# Patient Record
Sex: Female | Born: 1950
Health system: Southern US, Community
[De-identification: ages and names within clinical notes are randomized; demographics above are authoritative.]

## PROBLEM LIST (undated history)

## (undated) DIAGNOSIS — K635 Polyp of colon: Secondary | ICD-10-CM

## (undated) DIAGNOSIS — Z9889 Other specified postprocedural states: Secondary | ICD-10-CM

## (undated) DIAGNOSIS — I10 Essential (primary) hypertension: Secondary | ICD-10-CM

## (undated) DIAGNOSIS — F419 Anxiety disorder, unspecified: Secondary | ICD-10-CM

## (undated) DIAGNOSIS — R011 Cardiac murmur, unspecified: Secondary | ICD-10-CM

## (undated) DIAGNOSIS — T7840XA Allergy, unspecified, initial encounter: Secondary | ICD-10-CM

## (undated) DIAGNOSIS — Z974 Presence of external hearing-aid: Secondary | ICD-10-CM

## (undated) DIAGNOSIS — J45909 Unspecified asthma, uncomplicated: Secondary | ICD-10-CM

## (undated) DIAGNOSIS — H509 Unspecified strabismus: Secondary | ICD-10-CM

## (undated) DIAGNOSIS — H269 Unspecified cataract: Secondary | ICD-10-CM

## (undated) DIAGNOSIS — D649 Anemia, unspecified: Secondary | ICD-10-CM

## (undated) DIAGNOSIS — T753XXA Motion sickness, initial encounter: Secondary | ICD-10-CM

## (undated) DIAGNOSIS — R519 Headache, unspecified: Secondary | ICD-10-CM

## (undated) DIAGNOSIS — F32A Depression, unspecified: Secondary | ICD-10-CM

## (undated) DIAGNOSIS — C801 Malignant (primary) neoplasm, unspecified: Secondary | ICD-10-CM

## (undated) DIAGNOSIS — M1990 Unspecified osteoarthritis, unspecified site: Secondary | ICD-10-CM

## (undated) DIAGNOSIS — IMO0002 Reserved for concepts with insufficient information to code with codable children: Secondary | ICD-10-CM

## (undated) DIAGNOSIS — R112 Nausea with vomiting, unspecified: Secondary | ICD-10-CM

## (undated) HISTORY — DX: Malignant (primary) neoplasm, unspecified: C80.1

## (undated) HISTORY — DX: Unspecified strabismus: H50.9

## (undated) HISTORY — DX: Anxiety disorder, unspecified: F41.9

## (undated) HISTORY — DX: Polyp of colon: K63.5

## (undated) HISTORY — PX: GALLBLADDER SURGERY: SHX652

## (undated) HISTORY — PX: HEMORRHOID SURGERY: SHX153

## (undated) HISTORY — DX: Anemia, unspecified: D64.9

## (undated) HISTORY — PX: CHOLECYSTECTOMY: SHX55

## (undated) HISTORY — DX: Unspecified asthma, uncomplicated: J45.909

## (undated) HISTORY — DX: Unspecified osteoarthritis, unspecified site: M19.90

## (undated) HISTORY — DX: Unspecified cataract: H26.9

## (undated) HISTORY — DX: Reserved for concepts with insufficient information to code with codable children: IMO0002

## (undated) HISTORY — DX: Allergy, unspecified, initial encounter: T78.40XA

## (undated) HISTORY — DX: Depression, unspecified: F32.A

---
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
## 1998-05-18 HISTORY — PX: BILATERAL OOPHORECTOMY: SHX1221

## 1998-05-18 HISTORY — PX: ABDOMINAL HYSTERECTOMY: SHX81

## 1999-05-19 DIAGNOSIS — K635 Polyp of colon: Secondary | ICD-10-CM

## 1999-05-19 HISTORY — DX: Polyp of colon: K63.5

## 2011-03-26 ENCOUNTER — Encounter: Payer: Self-pay | Admitting: Internal Medicine

## 2011-03-27 ENCOUNTER — Ambulatory Visit: Payer: Self-pay | Admitting: Internal Medicine

## 2011-03-27 ENCOUNTER — Encounter: Payer: Self-pay | Admitting: Internal Medicine

## 2011-03-27 ENCOUNTER — Ambulatory Visit (INDEPENDENT_AMBULATORY_CARE_PROVIDER_SITE_OTHER): Payer: BC Managed Care – PPO | Admitting: Internal Medicine

## 2011-03-27 VITALS — BP 128/80 | HR 79 | Temp 99.7°F | Wt 158.0 lb

## 2011-03-27 DIAGNOSIS — J329 Chronic sinusitis, unspecified: Secondary | ICD-10-CM

## 2011-03-27 DIAGNOSIS — I10 Essential (primary) hypertension: Secondary | ICD-10-CM

## 2011-03-27 MED ORDER — AZITHROMYCIN 250 MG PO TABS: ORAL_TABLET | ORAL | Status: AC

## 2011-03-27 NOTE — Patient Instructions (Signed)

## 2011-03-27 NOTE — Progress Notes (Signed)
Subjective:    Patient ID: Shelley Zuniga, female    DOB: 12-Oct-1950, 60 y.o.   MRN: [id number]  Sinusitis This is a recurrent problem. The current episode started 1 to 4 weeks ago. The problem has been gradually worsening since onset. The pain is moderate. Associated symptoms include congestion, ear pain, headaches, sinus pressure and a sore throat. Pertinent negatives include no chills, coughing, neck pain or shortness of breath. Past treatments include acetaminophen, lying down, saline sprays and spray decongestants. The treatment provided no relief.     Outpatient Encounter Prescriptions as of 03/27/2011  Medication Sig Dispense Refill  . aspirin 81 MG tablet Take 81 mg by mouth daily.        Marland Kitchen buPROPion (WELLBUTRIN XL) 300 MG 24 hr tablet Take 300 mg by mouth daily.        . cholecalciferol (VITAMIN D) 1000 UNITS tablet Take 1,000 Units by mouth daily.        Tery Sanfilippo Calcium (STOOL SOFTENER PO) Take by mouth.        . estradiol (ESTRACE) 1 MG tablet Take 1 mg by mouth daily.        . fluticasone (FLONASE) 50 MCG/ACT nasal spray Place 2 sprays into the nose daily.        Marland Kitchen losartan (COZAAR) 50 MG tablet Take 50 mg by mouth daily.        . niacin 500 MG tablet Take 500 mg by mouth daily with breakfast.        . vitamin E 400 UNIT capsule Take 400 Units by mouth daily.        Marland Kitchen azithromycin (ZITHROMAX Z-PAK) 250 MG tablet Take 2 tablets (500 mg) on  Day 1,  followed by 1 tablet (250 mg) once daily on Days 2 through 5.  6 each  0    Review of Systems  Constitutional: Negative for fever, chills, appetite change, fatigue and unexpected weight change.  HENT: Positive for ear pain, congestion, sore throat, postnasal drip and sinus pressure. Negative for trouble swallowing, neck pain and voice change.   Eyes: Negative for visual disturbance.  Respiratory: Negative for cough, shortness of breath and wheezing.   Cardiovascular: Negative for chest pain, palpitations and leg swelling.    Gastrointestinal: Negative for nausea, vomiting, abdominal pain, diarrhea, constipation, blood in stool, abdominal distention and anal bleeding.  Musculoskeletal: Negative for myalgias, arthralgias and gait problem.  Skin: Negative for color change and rash.  Neurological: Positive for headaches. Negative for dizziness.  Hematological: Negative for adenopathy. Does not bruise/bleed easily.   BP 128/80  Pulse 79  Temp(Src) 99.7 F (37.6 C) (Oral)  Wt 158 lb (71.668 kg)  SpO2 96%     Objective:   Physical Exam  Constitutional: She is oriented to person, place, and time. She appears well-developed and well-nourished. No distress.  HENT:  Head: Normocephalic and atraumatic.  Right Ear: External ear normal. A middle ear effusion is present.  Left Ear: External ear normal. A middle ear effusion is present.  Nose: Mucosal edema present.  Mouth/Throat: Posterior oropharyngeal erythema present. No oropharyngeal exudate.  Eyes: Conjunctivae are normal. Pupils are equal, round, and reactive to light. Right eye exhibits no discharge. Left eye exhibits no discharge. No scleral icterus.  Neck: Normal range of motion. Neck supple. No tracheal deviation present. No thyromegaly present.  Cardiovascular: Normal rate, regular rhythm, normal heart sounds and intact distal pulses.  Exam reveals no gallop and no friction rub.   No murmur  heard. Pulmonary/Chest: Effort normal and breath sounds normal. No respiratory distress. She has no wheezes. She has no rales. She exhibits no tenderness.  Musculoskeletal: Normal range of motion. She exhibits no edema and no tenderness.  Lymphadenopathy:    She has no cervical adenopathy.  Neurological: She is alert and oriented to person, place, and time. No cranial nerve deficit. She exhibits normal muscle tone. Coordination normal.  Skin: Skin is warm and dry. No rash noted. She is not diaphoretic. No erythema. No pallor.  Psychiatric: She has a normal mood and  affect. Her behavior is normal. Judgment and thought content normal.          Assessment & Plan:  1. Sinusitis - Sx consistent with pt h/o recurrent sinusitis. Will treat with azithromycin as pt has tolerated well in past. Will continue to use mucinex and ibuprofen prn. Follow up if symptoms not improving in next 48-72hr.  2. Hypertension - Due for follow up labs including CMP, urine microalbumin. Will set up labs prior to follow up with Dr. Darrick Huntsman.

## 2011-04-06 ENCOUNTER — Other Ambulatory Visit (INDEPENDENT_AMBULATORY_CARE_PROVIDER_SITE_OTHER): Payer: BC Managed Care – PPO | Admitting: *Deleted

## 2011-04-06 DIAGNOSIS — I10 Essential (primary) hypertension: Secondary | ICD-10-CM

## 2011-04-06 LAB — LIPID PANEL
HDL: 54.6 mg/dL (ref >39.00)
VLDL: 44.8 mg/dL — ABNORMAL HIGH (ref 0.0–40.0)

## 2011-04-06 LAB — COMPREHENSIVE METABOLIC PANEL
ALT: 14 U/L (ref 0–35)
AST: 14 U/L (ref 0–37)
Creatinine, Ser: 0.8 mg/dL (ref 0.4–1.2)
Total Bilirubin: 0.6 mg/dL (ref 0.3–1.2)

## 2011-04-06 LAB — MICROALBUMIN / CREATININE URINE RATIO
Creatinine,U: 135.7 mg/dL
Microalb, Ur: 0.6 mg/dL (ref 0.0–1.9)

## 2011-04-13 ENCOUNTER — Encounter: Payer: Self-pay | Admitting: Internal Medicine

## 2011-04-13 ENCOUNTER — Ambulatory Visit (INDEPENDENT_AMBULATORY_CARE_PROVIDER_SITE_OTHER): Payer: BC Managed Care – PPO | Admitting: Internal Medicine

## 2011-04-13 DIAGNOSIS — Z6825 Body mass index (BMI) 25.0-25.9, adult: Secondary | ICD-10-CM

## 2011-04-13 DIAGNOSIS — E782 Mixed hyperlipidemia: Secondary | ICD-10-CM

## 2011-04-13 DIAGNOSIS — Z124 Encounter for screening for malignant neoplasm of cervix: Secondary | ICD-10-CM | POA: Insufficient documentation

## 2011-04-13 DIAGNOSIS — E663 Overweight: Secondary | ICD-10-CM

## 2011-04-13 DIAGNOSIS — Z1239 Encounter for other screening for malignant neoplasm of breast: Secondary | ICD-10-CM

## 2011-04-13 DIAGNOSIS — Z13820 Encounter for screening for osteoporosis: Secondary | ICD-10-CM

## 2011-04-13 DIAGNOSIS — Z1211 Encounter for screening for malignant neoplasm of colon: Secondary | ICD-10-CM

## 2011-04-13 MED ORDER — PREDNISONE (PAK) 10 MG PO TABS: ORAL_TABLET | ORAL | Status: AC

## 2011-04-13 MED ORDER — AMOXICILLIN-POT CLAVULANATE 875-125 MG PO TABS: 1.0000 | ORAL_TABLET | Freq: Two times a day (BID) | ORAL | Status: AC

## 2011-04-13 NOTE — Patient Instructions (Addendum)
Avoid steroid nasal spray for one to two weeks  Resume saline spray twice a day..  Add Sudafed PE  10 mg every 6 hours for 3-4 days  mucines (or generic as guaifenesin ) 600 mg twice daily to thin out secretions.  Lots of water.  Prescribed meds: Prednisone taper over 6 days.  augmentin 1 tablet twice daily with food for 2 weeks.   Samples given:  Align (probiotic) 1 tablet daily to prevent diarrhea from the antibiotic killing off good bacteria  If you have a mild cough,  Try Delsym cough suppressant   To help lower your triglycerides and raise your HDL:  Exercise gaol is 25 minutes 5 days per week.  (if walking you should be short of breath when you finish)  Read about the low glycemic index diets and the Mediterranean Diet  [employer]  Is a [place] based company than males a pita bread and a flatbread that are 4 net carbs/serving ([employer] and [employer])

## 2011-04-13 NOTE — Progress Notes (Signed)
Subjective:    Patient ID: Shelley Zuniga, female    DOB: 12/07/1950, 60 y.o.   MRN: [id number]  HPI  60 yo yr old nonsmoker treated for sinusitis on Nov 8 by Dr. Dan Humphreys with azithromycin x 5 days with incomplete resolution of symptoms.  Continues to report sinus and ear pain with purulent and blood streaked nasal discharge.  No fevers, odynophagia or dizziness.  Second issue is repeat annual fasting lipids were notable for triglyceride elevation, LDL elevation (mild) and drop in HDL.     Past Medical History  Diagnosis Date  . Strabismus age 42    eye surgery  . Colon polyp 2001    colonoscopy, [facility]        Current Outpatient Prescriptions on File Prior to Visit  Medication Sig Dispense Refill  . aspirin 81 MG tablet Take 81 mg by mouth daily.        Marland Kitchen buPROPion (WELLBUTRIN XL) 300 MG 24 hr tablet Take 300 mg by mouth daily.        . cholecalciferol (VITAMIN D) 1000 UNITS tablet Take 1,000 Units by mouth daily.        Tery Sanfilippo Calcium (STOOL SOFTENER PO) Take by mouth.        . estradiol (ESTRACE) 1 MG tablet Take 1 mg by mouth daily.        . fluticasone (FLONASE) 50 MCG/ACT nasal spray Place 2 sprays into the nose daily.        Marland Kitchen losartan (COZAAR) 50 MG tablet Take 50 mg by mouth daily.        . niacin 500 MG tablet Take 500 mg by mouth daily with breakfast.        . vitamin E 400 UNIT capsule Take 400 Units by mouth daily.           Review of Systems  Constitutional: Negative for fever, chills, appetite change, fatigue and unexpected weight change.  HENT: Positive for ear pain, congestion, sore throat, postnasal drip and sinus pressure. Negative for trouble swallowing, neck pain and voice change.   Eyes: Negative for visual disturbance.  Respiratory: Negative for cough, shortness of breath and wheezing.   Cardiovascular: Negative for chest pain, palpitations and leg swelling.  Gastrointestinal: Negative for nausea, vomiting, abdominal pain, diarrhea, constipation, blood in  stool, abdominal distention and anal bleeding.  Musculoskeletal: Negative for myalgias, arthralgias and gait problem.  Skin: Negative for color change and rash.  Neurological: Positive for headaches. Negative for dizziness.  Hematological: Negative for adenopathy. Does not bruise/bleed easily.       Objective:   Physical Exam  Constitutional: She is oriented to person, place, and time. She appears well-developed and well-nourished. No distress.  HENT:  Head: Normocephalic and atraumatic.  Right Ear: External ear normal. Tympanic membrane is erythematous and bulging. A middle ear effusion is present.  Left Ear: External ear normal. Tympanic membrane is erythematous and bulging. A middle ear effusion is present.  Nose: Mucosal edema present.  Mouth/Throat: No oropharyngeal exudate or posterior oropharyngeal erythema.  Eyes: Conjunctivae are normal. Pupils are equal, round, and reactive to light. Right eye exhibits no discharge. Left eye exhibits no discharge. No scleral icterus.  Neck: Normal range of motion. Neck supple. No tracheal deviation present. No thyromegaly present.  Cardiovascular: Normal rate, regular rhythm, normal heart sounds and intact distal pulses.  Exam reveals no gallop and no friction rub.   No murmur heard. Pulmonary/Chest: Effort normal and breath sounds normal. No respiratory distress.  She has no wheezes. She has no rales. She exhibits no tenderness.  Musculoskeletal: Normal range of motion. She exhibits no edema and no tenderness.  Lymphadenopathy:    She has no cervical adenopathy.  Neurological: She is alert and oriented to person, place, and time. No cranial nerve deficit. She exhibits normal muscle tone. Coordination normal.  Skin: Skin is warm and dry. No rash noted. She is not diaphoretic. No erythema. No pallor.  Psychiatric: She has a normal mood and affect. Her behavior is normal. Judgment and thought content normal.          Assessment & Plan:    Chronic sinutisis: will treat for 2 weeks with augmentin , steroid taper and decongestant.  Align saples given to mitigate risk of AAD /c dif colitis.

## 2011-04-14 ENCOUNTER — Encounter: Payer: Self-pay | Admitting: Internal Medicine

## 2011-04-14 DIAGNOSIS — E782 Mixed hyperlipidemia: Secondary | ICD-10-CM | POA: Insufficient documentation

## 2011-04-14 DIAGNOSIS — Z13820 Encounter for screening for osteoporosis: Secondary | ICD-10-CM | POA: Insufficient documentation

## 2011-04-14 NOTE — Assessment & Plan Note (Addendum)
With rise in triglycerides, drop in HDL in elevation mild of LDL.  Spent 10 minutes discussing diet and exercise for goals of lipid improvement and weight management.Contineu niacin for now.  Repeat in  6 months

## 2011-05-19 HISTORY — PX: BASAL CELL CARCINOMA EXCISION: SHX1214

## 2011-05-29 ENCOUNTER — Telehealth: Payer: Self-pay | Admitting: *Deleted

## 2011-05-29 MED ORDER — DOXYCYCLINE HYCLATE 100 MG PO TABS: 100.0000 mg | ORAL_TABLET | Freq: Two times a day (BID) | ORAL | Status: AC

## 2011-05-29 NOTE — Telephone Encounter (Signed)
You can call her in doxycycline 100 mg one tablet twice daily with food.  #14 no refills this is an antibiotic.

## 2011-05-29 NOTE — Telephone Encounter (Signed)
Patient notified- Rx called to pharmacy. 

## 2011-05-29 NOTE — Telephone Encounter (Signed)
Patient complains of cough, headaches, some congestion, post nasal drip. NO fever. She has been taking sudafed like you had recommended to her in the past, but isn't noticing a big difference. She is currently taking care of her grandchildren because her daughter in law just had heart valve repair and she is concerned that she is going to get them sick or even her daughter in law. She is asking if she should start antibiotic or if she should try something different over the counter.

## 2011-06-23 ENCOUNTER — Encounter: Payer: Self-pay | Admitting: Internal Medicine

## 2011-07-03 ENCOUNTER — Ambulatory Visit (INDEPENDENT_AMBULATORY_CARE_PROVIDER_SITE_OTHER): Payer: BC Managed Care – PPO | Admitting: Internal Medicine

## 2011-07-03 ENCOUNTER — Encounter: Payer: Self-pay | Admitting: Internal Medicine

## 2011-07-03 VITALS — BP 118/82 | HR 72 | Temp 98.5°F | Wt 158.0 lb

## 2011-07-03 DIAGNOSIS — J029 Acute pharyngitis, unspecified: Secondary | ICD-10-CM

## 2011-07-03 LAB — POCT RAPID STREP A (OFFICE): Rapid Strep A Screen: NEGATIVE

## 2011-07-03 MED ORDER — AMOXICILLIN-POT CLAVULANATE 875-125 MG PO TABS: 1.0000 | ORAL_TABLET | Freq: Two times a day (BID) | ORAL | Status: AC

## 2011-07-03 NOTE — Progress Notes (Signed)
Subjective:    Patient ID: Shelley Zuniga, female    DOB: Oct 24, 1950, 61 y.o.   MRN: [id number]  HPI  61 yr old presents with sudden onset of sore throat , woke her up this am at 1:00am.  Positive sick contact Wednesday , family members of friend she had dinner with had strep throat.  No fevers, myalgias or headache,  No sinus drainage,  She does snore .    Past Medical History  Diagnosis Date  . Strabismus age 59    eye surgery  . Colon polyp 2001    colonoscopy, [facility]    Current Outpatient Prescriptions on File Prior to Visit  Medication Sig Dispense Refill  . aspirin 81 MG tablet Take 81 mg by mouth daily.        . cholecalciferol (VITAMIN D) 1000 UNITS tablet Take 1,000 Units by mouth daily.        Tery Sanfilippo Calcium (STOOL SOFTENER PO) Take by mouth.        . estradiol (ESTRACE) 1 MG tablet Take 1 mg by mouth daily.        . fluticasone (FLONASE) 50 MCG/ACT nasal spray Place 2 sprays into the nose daily.        Marland Kitchen losartan (COZAAR) 50 MG tablet Take 50 mg by mouth daily.        . vitamin E 400 UNIT capsule Take 400 Units by mouth daily.        Marland Kitchen buPROPion (WELLBUTRIN XL) 300 MG 24 hr tablet Take 300 mg by mouth daily.        . niacin 500 MG tablet Take 500 mg by mouth daily with breakfast.          Review of Systems  Constitutional: Negative for fever, chills and unexpected weight change.  HENT: Positive for sore throat. Negative for hearing loss, ear pain, nosebleeds, congestion, facial swelling, rhinorrhea, sneezing, mouth sores, trouble swallowing, neck pain, neck stiffness, voice change, postnasal drip, sinus pressure, tinnitus and ear discharge.   Eyes: Negative for pain, discharge, redness and visual disturbance.  Respiratory: Negative for cough, chest tightness, shortness of breath, wheezing and stridor.   Cardiovascular: Negative for chest pain, palpitations and leg swelling.  Musculoskeletal: Negative for myalgias and arthralgias.  Skin: Negative for color change and rash.   Neurological: Negative for dizziness, weakness, light-headedness and headaches.  Hematological: Negative for adenopathy.      Objective:   Physical Exam  Constitutional: She is oriented to person, place, and time. She appears well-developed and well-nourished.  HENT:  Mouth/Throat: Posterior oropharyngeal erythema present.  Eyes: EOM are normal. Pupils are equal, round, and reactive to light. No scleral icterus.  Neck: Normal range of motion. Neck supple. No JVD present. No thyromegaly present.  Cardiovascular: Normal rate, regular rhythm, normal heart sounds and intact distal pulses.   Pulmonary/Chest: Effort normal and breath sounds normal.  Abdominal: Soft. Bowel sounds are normal. She exhibits no mass. There is no tenderness.  Musculoskeletal: Normal range of motion. She exhibits no edema.  Lymphadenopathy:    She has cervical adenopathy.  Neurological: She is alert and oriented to person, place, and time.  Skin: Skin is warm and dry.  Psychiatric: She has a normal mood and affect.      Assessment & Plan:  Pharyngitis:  Rapid strep test was negative.   Recommeded patient treat for viral URI and delay use of augmentin  for 48 hours since she is not febrile or if symptoms dfo not  resolve.he

## 2011-07-05 ENCOUNTER — Encounter: Payer: Self-pay | Admitting: Internal Medicine

## 2011-08-04 ENCOUNTER — Other Ambulatory Visit: Payer: Self-pay | Admitting: *Deleted

## 2011-08-04 MED ORDER — LOSARTAN POTASSIUM 50 MG PO TABS: 50.0000 mg | ORAL_TABLET | Freq: Every day | ORAL | Status: DC

## 2011-09-29 ENCOUNTER — Encounter: Payer: Self-pay | Admitting: Internal Medicine

## 2011-09-29 ENCOUNTER — Ambulatory Visit (INDEPENDENT_AMBULATORY_CARE_PROVIDER_SITE_OTHER): Payer: BC Managed Care – PPO | Admitting: Internal Medicine

## 2011-09-29 VITALS — BP 116/64 | HR 66 | Temp 98.3°F | Resp 14 | Ht 64.0 in | Wt 152.8 lb

## 2011-09-29 DIAGNOSIS — E782 Mixed hyperlipidemia: Secondary | ICD-10-CM

## 2011-09-29 DIAGNOSIS — I10 Essential (primary) hypertension: Secondary | ICD-10-CM

## 2011-09-29 DIAGNOSIS — E785 Hyperlipidemia, unspecified: Secondary | ICD-10-CM

## 2011-09-29 NOTE — Progress Notes (Signed)
Patient ID: Shelley Zuniga, female   DOB: 10-12-1950, 61 y.o.   MRN: [id number]  Patient Active Problem List  Diagnoses  . Hypertension  . Screening for cervical cancer  . Screening for breast cancer  . Screening for colon cancer  . Screening for osteoporosis  . Mixed hyperlipidemia    Subjective:  CC:   Chief Complaint  Patient presents with  . Follow-up    HPI:   Shelley Zuniga a 61 y.o. female who presents for  6 month follow up on hypertension,  Obesity and hyperlipidemia.  She has las lost 10 lbs by her scales by following a low glycemic index diet and exercising in the yard. She feels good, has no new issues,  Except occasional occurrence of right deltoid/arm pain . The pain is mild,  Aggravated with use of arm including forwarf flexion and internal rotation.  No history of trauma.  No weakness or numbness.     Past Medical History  Diagnosis Date  . Strabismus age 48    eye surgery  . Colon polyp 2001    colonoscopy, [facility]     Past Surgical History  Procedure Date  . Hemorrhoid surgery   . Gallbladder surgery   . Eye surgery 1956    to correct strabismus  . Bilateral oophorectomy 2000    fibroid cysts  . Abdominal hysterectomy 2000    [place], [facility], fibroids/menorrhagia         The following portions of the patient's history were reviewed and updated as appropriate: Allergies, current medications, and problem list.    Review of Systems:   12 Pt  review of systems was negative except those addressed in the HPI,     History   Social History  . Marital Status: Married    Spouse Name: N/A    Number of Children: N/A  . Years of Education: N/A   Occupational History  . Not on file.   Social History Main Topics  . Smoking status: Never Smoker   . Smokeless tobacco: Never Used  . Alcohol Use: No  . Drug Use: No  . Sexually Active: Yes    Birth Control/ Protection: Post-menopausal   Other Topics Concern  . Not on file   Social History  Narrative  . No narrative on file    Objective:  BP 116/64  Pulse 66  Temp(Src) 98.3 F (36.8 C) (Oral)  Resp 14  Ht 5\' 4"  (1.626 m)  Wt 152 lb 12 oz (69.287 kg)  BMI 26.22 kg/m2  SpO2 95%  General appearance: alert, cooperative and appears stated age Ears: normal TM's and external ear canals both ears Throat: lips, mucosa, and tongue normal; teeth and gums normal Neck: no adenopathy, no carotid bruit, supple, symmetrical, trachea midline and thyroid not enlarged, symmetric, no tenderness/mass/nodules Back: symmetric, no curvature. ROM normal. No CVA tenderness. Lungs: clear to auscultation bilaterally Heart: regular rate and rhythm, S1, S2 normal, no murmur, click, rub or gallop Abdomen: soft, non-tender; bowel sounds normal; no masses,  no organomegaly Pulses: 2+ and symmetric Skin: Skin color, texture, turgor normal. No rashes or lesions Lymph nodes: Cervical, supraclavicular, and axillary nodes normal.  Assessment and Plan:  Hypertension Well controlled on current medications.  No changes today. Renal function to check today  Mixed hyperlipidemia Mild with trigs and LDL slightly up,  Addressed with weight loss. Repeat due. Taking as aspirin daily    Updated Medication List Outpatient Encounter Prescriptions as of 09/29/2011  Medication Sig  Dispense Refill  . aspirin 81 MG tablet Take 81 mg by mouth daily.        . cholecalciferol (VITAMIN D) 1000 UNITS tablet Take 1,000 Units by mouth daily.        Tery Sanfilippo Calcium (STOOL SOFTENER PO) Take by mouth.        . estradiol (ESTRACE) 1 MG tablet Take 1 mg by mouth daily.        . fluticasone (FLONASE) 50 MCG/ACT nasal spray Place 2 sprays into the nose daily.        Marland Kitchen losartan (COZAAR) 50 MG tablet Take 1 tablet (50 mg total) by mouth daily.  90 tablet  3  . vitamin E 400 UNIT capsule Take 400 Units by mouth daily.        Marland Kitchen DISCONTD: buPROPion (WELLBUTRIN XL) 300 MG 24 hr tablet Take 300 mg by mouth daily.        Marland Kitchen  DISCONTD: niacin 500 MG tablet Take 500 mg by mouth daily with breakfast.           Orders Placed This Encounter  Procedures  . HM PAP SMEAR  . Lipid panel  . COMPLETE METABOLIC PANEL WITH GFR    No Follow-up on file.

## 2011-09-29 NOTE — Patient Instructions (Signed)
Consider the Low Glycemic Index Diet and 6 smaller meals daily .  This boosts your metabolism and regulates your sugars:   7 AM Low carbohydrate Protein  Shakes (EAS Carb Control  Or Atkins ,  Available everywhere,   In  cases at [employer] )  2.5 carbs  (Add or substitute a toasted sandwhich thin w/ peanut butter)  10 AM: Protein bar by Atkins (snack size,  Chocolate lover's variety at  [employer])    Lunch: sandwich on pita bread or flatbread (Joseph's makes a pita bread and a flat bread , available at [employer] and [employer]; Toufayah makes a low carb flatbread available at [employer] and [employer]) Mission makes a low carb whole wheat tortilla available at [employer] stores (Carb balance)  3 PM:  Mid day :  Another protein bar,  Or a  cheese stick, 1/4 cup of almonds, walnuts, pistachios, pecans, peanuts,  Macadamia nuts  6 PM  Dinner:  "mean and green:"  Meat/chicken/fish, salad, and green veggie : use ranch, vinagrette,  Blue cheese, etc  9 PM snack : Breyer's low carb fudgsicle or  ice cream bar (Carb Smart), or  Weight Watcher's ice cream bar , or another protein shake

## 2011-09-29 NOTE — Assessment & Plan Note (Signed)
Well controlled on current medications.  No changes today. Renal function to check today

## 2011-09-29 NOTE — Assessment & Plan Note (Addendum)
Mild with trigs and LDL slightly up,  Addressed with weight loss. Repeat due. Taking as aspirin daily

## 2011-10-14 ENCOUNTER — Other Ambulatory Visit (INDEPENDENT_AMBULATORY_CARE_PROVIDER_SITE_OTHER): Payer: BC Managed Care – PPO | Admitting: *Deleted

## 2011-10-14 DIAGNOSIS — E785 Hyperlipidemia, unspecified: Secondary | ICD-10-CM

## 2011-10-14 LAB — LIPID PANEL
HDL: 51.7 mg/dL (ref >39.00)
LDL Cholesterol: 102 mg/dL — ABNORMAL HIGH (ref 0–99)
Total CHOL/HDL Ratio: 4
VLDL: 29.8 mg/dL (ref 0.0–40.0)

## 2011-10-14 LAB — COMPLETE METABOLIC PANEL WITH GFR
ALT: 8 U/L (ref 0–35)
AST: 10 U/L (ref 0–37)
Alkaline Phosphatase: 51 U/L (ref 39–117)
Creat: 0.72 mg/dL (ref 0.50–1.10)
Sodium: 140 mEq/L (ref 135–145)
Total Bilirubin: 0.5 mg/dL (ref 0.3–1.2)
Total Protein: 6.2 g/dL (ref 6.0–8.3)

## 2011-12-09 ENCOUNTER — Other Ambulatory Visit (INDEPENDENT_AMBULATORY_CARE_PROVIDER_SITE_OTHER): Payer: BC Managed Care – PPO | Admitting: *Deleted

## 2011-12-09 DIAGNOSIS — N390 Urinary tract infection, site not specified: Secondary | ICD-10-CM

## 2011-12-09 LAB — POCT URINALYSIS DIPSTICK
Bilirubin, UA: NEGATIVE
Glucose, UA: NEGATIVE
Nitrite, UA: POSITIVE
Spec Grav, UA: 1.015

## 2011-12-09 MED ORDER — CIPROFLOXACIN HCL 250 MG PO TABS: 250.0000 mg | ORAL_TABLET | Freq: Two times a day (BID) | ORAL | Status: AC

## 2011-12-09 NOTE — Addendum Note (Signed)
Addended by: Duncan Dull on: 12/09/2011 01:09 PM   Modules accepted: Orders

## 2011-12-13 LAB — URINE CULTURE: Colony Count: >=100000

## 2012-07-15 ENCOUNTER — Telehealth: Payer: Self-pay | Admitting: Internal Medicine

## 2012-07-15 NOTE — Telephone Encounter (Signed)
Disregard. Pt is going to walk-in clinic since we have no openings.

## 2012-07-15 NOTE — Telephone Encounter (Signed)
Called pt. States throat has been hurting since Wednesday. Has a hard time swallowing anything. States she has post nasal drainage, cough- productive, sinus pressure, Afebrile.

## 2012-07-15 NOTE — Telephone Encounter (Signed)
Patient Information:  Caller Name: Diego  Phone: [phone]  Patient: Shelley Zuniga, Shelley Zuniga  Gender: Female  DOB: October 16, 1950  Age: 62 Years  PCP: Duncan Dull (Adults only)  Office Follow Up:  Does the office need to follow up with this patient?: Yes  Instructions For The Office: Please call back regarding work in appointment today in office;  declined appointments at [facility] or [place] office due to distance.  RN Note:  Throat red with white.  Sore throat pain increasing. Drinking fluids and voiding. No appointments remain in  [place] office; does not want to drive to [facility] or [facility] offices.  Message sent to office staff for possible work-in appointment; please call back ASAP.   Symptoms  Reason For Call & Symptoms: Sore throat  Reviewed Health History In EMR: Yes  Reviewed Medications In EMR: Yes  Reviewed Allergies In EMR: Yes  Reviewed Surgeries / Procedures: Yes  Date of Onset of Symptoms: 07/06/2012  Treatments Tried: hot and cold fluids, Advil  Treatments Tried Worked: Yes  Guideline(s) Used:  Sore Throat  Disposition Per Guideline:   See Today in Office  Reason For Disposition Reached:   Severe sore throat pain  Advice Given:  For Relief of Sore Throat Pain:  Sip warm chicken broth or apple juice.  Suck on hard candy or a throat lozenge (over-the-counter).  Gargle warm salt water 3 times daily (1 teaspoon of salt in 8 oz or 240 ml of warm water).  Pain Medicines:  For pain relief, you can take either acetaminophen, ibuprofen, or naproxen.  Soft Diet:   Cold drinks and milk shakes are especially good (Reason: swollen tonsils can make some foods hard to swallow).  Liquids:  Adequate liquid intake is important to prevent dehydration. Drink 6-8 glasses of water per day.  Expected Course:  Sore throats with viral illnesses usually last 3 or 4 days.  Call Back If:  Sore throat is the main symptom and it lasts longer than 24 hours  Sore throat is mild but lasts  longer than 4 days  Fever lasts longer than 3 days  You become worse.

## 2012-07-29 ENCOUNTER — Other Ambulatory Visit: Payer: Self-pay | Admitting: Internal Medicine

## 2012-07-29 NOTE — Telephone Encounter (Signed)
Refilled #30 with 0 refills. Pt needs an appt for additional refills.

## 2012-09-06 ENCOUNTER — Other Ambulatory Visit: Payer: Self-pay | Admitting: Internal Medicine

## 2012-09-07 NOTE — Telephone Encounter (Signed)
Rx sent to pharmacy by escript Appt 09/19/12

## 2012-09-19 ENCOUNTER — Ambulatory Visit (INDEPENDENT_AMBULATORY_CARE_PROVIDER_SITE_OTHER): Payer: BC Managed Care – PPO | Admitting: Internal Medicine

## 2012-09-19 ENCOUNTER — Encounter: Payer: Self-pay | Admitting: Internal Medicine

## 2012-09-19 VITALS — BP 126/72 | HR 63 | Temp 98.0°F | Resp 16 | Ht 64.0 in | Wt 158.2 lb

## 2012-09-19 DIAGNOSIS — Z0000 Encounter for general adult medical examination without abnormal findings: Secondary | ICD-10-CM | POA: Insufficient documentation

## 2012-09-19 DIAGNOSIS — Z1239 Encounter for other screening for malignant neoplasm of breast: Secondary | ICD-10-CM

## 2012-09-19 DIAGNOSIS — Z1211 Encounter for screening for malignant neoplasm of colon: Secondary | ICD-10-CM

## 2012-09-19 DIAGNOSIS — Z124 Encounter for screening for malignant neoplasm of cervix: Secondary | ICD-10-CM

## 2012-09-19 DIAGNOSIS — I10 Essential (primary) hypertension: Secondary | ICD-10-CM

## 2012-09-19 DIAGNOSIS — Z13820 Encounter for screening for osteoporosis: Secondary | ICD-10-CM

## 2012-09-19 MED ORDER — ZOSTER VACCINE LIVE 19400 UNT/0.65ML ~~LOC~~ SOLR: 0.6500 mL | Freq: Once | SUBCUTANEOUS | Status: DC

## 2012-09-19 NOTE — Progress Notes (Signed)
Patient ID: Shelley Zuniga, female   DOB: Mar 01, 1951, 62 y.o.   MRN: [id number]  Subjective:     Shelley Zuniga is a 62 y.o. female and is here for a comprehensive physical exam. The patient reports no problems.  History   Social History  . Marital Status: Married    Spouse Name: N/A    Number of Children: N/A  . Years of Education: N/A   Occupational History  . Not on file.   Social History Main Topics  . Smoking status: Never Smoker   . Smokeless tobacco: Never Used  . Alcohol Use: No  . Drug Use: No  . Sexually Active: Yes    Birth Control/ Protection: Post-menopausal   Other Topics Concern  . Not on file   Social History Narrative  . No narrative on file   Health Maintenance  Topic Date Due  . Tetanus/tdap  01/27/1969  . Zostavax  01/27/2010  . Mammogram  10/17/2011  . Influenza Vaccine  01/16/2013  . Pap Smear  03/31/2014  . Colonoscopy  01/17/2020    The following portions of the patient's history were reviewed and updated as appropriate: allergies, current medications, past family history, past medical history, past social history, past surgical history and problem list.  Review of Systems A comprehensive review of systems was negative.   Objective:   BP 126/72  Pulse 63  Temp(Src) 98 F (36.7 C) (Oral)  Resp 16  Ht 5\' 4"  (1.626 m)  Wt 158 lb 4 oz (71.782 kg)  BMI 27.15 kg/m2  SpO2 99%  General appearance: alert, cooperative and appears stated age Ears: normal TM's and external ear canals both ears Throat: lips, mucosa, and tongue normal; teeth and gums normal Neck: no adenopathy, no carotid bruit, supple, symmetrical, trachea midline and thyroid not enlarged, symmetric, no tenderness/mass/nodules Back: symmetric, no curvature. ROM normal. No CVA tenderness. Lungs: clear to auscultation bilaterally Heart: regular rate and rhythm, S1, S2 normal, no murmur, click, rub or gallop Abdomen: soft, non-tender; bowel sounds normal; no masses,  no  organomegaly Pulses: 2+ and symmetric Skin: Skin color, texture, turgor normal. No rashes or lesions Lymph nodes: Cervical, supraclavicular, and axillary nodes normal.  .    Assessment:   Screening for osteoporosis Repeat DEXA scan this year per patient was normal.   Screening for colon cancer Normal 2011.  Screening for cervical cancer Normal 2-14 by [facility].  Screening for breast cancer Normal 2014 , ordered by [facility].  Hypertension Well controlled on current regimen. Renal function stable, no changes today.   Updated Medication List Outpatient Encounter Prescriptions as of 09/19/2012  Medication Sig Dispense Refill  . aspirin 81 MG tablet Take 81 mg by mouth daily.        . cholecalciferol (VITAMIN D) 1000 UNITS tablet Take 1,000 Units by mouth daily.        Tery Sanfilippo Calcium (STOOL SOFTENER PO) Take by mouth.        . estradiol (ESTRACE) 1 MG tablet Take 1 mg by mouth daily.        . fluticasone (FLONASE) 50 MCG/ACT nasal spray Place 2 sprays into the nose daily.        Marland Kitchen losartan (COZAAR) 50 MG tablet take 1 tablet by mouth once daily  30 tablet  0  . vitamin E 400 UNIT capsule Take 400 Units by mouth daily.        Marland Kitchen zoster vaccine live, PF, (ZOSTAVAX) 57846 UNT/0.65ML injection Inject 19,400 Units into  the skin once.  1 each  0   No facility-administered encounter medications on file as of 09/19/2012.

## 2012-09-19 NOTE — Patient Instructions (Addendum)
You are doing very well  Return for fasting labs at your convenience.   continue 1000 units of Vitamin D daily

## 2012-09-19 NOTE — Assessment & Plan Note (Signed)
Normal 2-14 by [facility].

## 2012-09-19 NOTE — Assessment & Plan Note (Signed)
Well controlled on current regimen. Renal function stable, no changes today. 

## 2012-09-19 NOTE — Assessment & Plan Note (Signed)
Annual comprehensive exam was done excluding breast, pelvic and PAP smear. All screenings have been addressed .  

## 2012-09-19 NOTE — Assessment & Plan Note (Signed)
Normal 2014 , ordered by [facility].

## 2012-09-19 NOTE — Assessment & Plan Note (Signed)
Normal 2011.

## 2012-09-19 NOTE — Assessment & Plan Note (Signed)
Repeat DEXA scan this year per patient was normal.

## 2012-09-30 ENCOUNTER — Telehealth: Payer: Self-pay | Admitting: *Deleted

## 2012-09-30 DIAGNOSIS — R5383 Other fatigue: Secondary | ICD-10-CM

## 2012-09-30 DIAGNOSIS — E785 Hyperlipidemia, unspecified: Secondary | ICD-10-CM

## 2012-09-30 NOTE — Telephone Encounter (Signed)
Pt is coming in for labs Monday 05.19.2014 what labs and dx?  Thank you

## 2012-10-03 ENCOUNTER — Other Ambulatory Visit (INDEPENDENT_AMBULATORY_CARE_PROVIDER_SITE_OTHER): Payer: BC Managed Care – PPO

## 2012-10-03 DIAGNOSIS — E785 Hyperlipidemia, unspecified: Secondary | ICD-10-CM

## 2012-10-03 DIAGNOSIS — R5383 Other fatigue: Secondary | ICD-10-CM

## 2012-10-03 LAB — CBC WITH DIFFERENTIAL/PLATELET
Basophils Relative: 1 % (ref 0.0–3.0)
Eosinophils Absolute: 0.2 10*3/uL (ref 0.0–0.7)
Eosinophils Relative: 3.8 % (ref 0.0–5.0)
Lymphocytes Relative: 27 % (ref 12.0–46.0)
MCHC: 35 g/dL (ref 30.0–36.0)
Neutrophils Relative %: 60.1 % (ref 43.0–77.0)
RBC: 4.25 Mil/uL (ref 3.87–5.11)
WBC: 5.8 10*3/uL (ref 4.5–10.5)

## 2012-10-03 LAB — TSH: TSH: 1.57 u[IU]/mL (ref 0.35–5.50)

## 2012-10-03 LAB — LIPID PANEL
Cholesterol: 211 mg/dL — ABNORMAL HIGH (ref 0–200)
HDL: 53.9 mg/dL (ref >39.00)
Total CHOL/HDL Ratio: 4
Triglycerides: 166 mg/dL — ABNORMAL HIGH (ref 0.0–149.0)
VLDL: 33.2 mg/dL (ref 0.0–40.0)

## 2012-10-03 LAB — COMPREHENSIVE METABOLIC PANEL
Albumin: 3.5 g/dL (ref 3.5–5.2)
BUN: 14 mg/dL (ref 6–23)
CO2: 28 mEq/L (ref 19–32)
Calcium: 9.1 mg/dL (ref 8.4–10.5)
Chloride: 105 mEq/L (ref 96–112)
Glucose, Bld: 106 mg/dL — ABNORMAL HIGH (ref 70–99)
Potassium: 4.2 mEq/L (ref 3.5–5.1)

## 2012-10-03 LAB — LDL CHOLESTEROL, DIRECT: Direct LDL: 128.1 mg/dL

## 2012-10-08 ENCOUNTER — Other Ambulatory Visit: Payer: Self-pay | Admitting: Internal Medicine

## 2012-11-07 ENCOUNTER — Other Ambulatory Visit: Payer: Self-pay | Admitting: Internal Medicine

## 2012-11-07 MED ORDER — LOSARTAN POTASSIUM 50 MG PO TABS: ORAL_TABLET | ORAL | Status: DC

## 2013-05-17 ENCOUNTER — Encounter: Payer: Self-pay | Admitting: Internal Medicine

## 2013-05-17 ENCOUNTER — Ambulatory Visit (INDEPENDENT_AMBULATORY_CARE_PROVIDER_SITE_OTHER): Payer: BC Managed Care – PPO | Admitting: Internal Medicine

## 2013-05-17 VITALS — BP 130/60 | HR 77 | Temp 98.2°F | Wt 158.0 lb

## 2013-05-17 DIAGNOSIS — J329 Chronic sinusitis, unspecified: Secondary | ICD-10-CM

## 2013-05-17 DIAGNOSIS — J019 Acute sinusitis, unspecified: Secondary | ICD-10-CM

## 2013-05-17 MED ORDER — AMOXICILLIN-POT CLAVULANATE 875-125 MG PO TABS: 1.0000 | ORAL_TABLET | Freq: Two times a day (BID) | ORAL | Status: DC

## 2013-05-17 MED ORDER — METHYLPREDNISOLONE ACETATE 40 MG/ML IJ SUSP: 40.0000 mg | Freq: Once | INTRAMUSCULAR | Status: DC

## 2013-05-17 MED ORDER — PREDNISONE (PAK) 10 MG PO TABS: ORAL_TABLET | ORAL | Status: DC

## 2013-05-17 NOTE — Progress Notes (Signed)
Pre visit review using our clinic review tool, if applicable. No additional management support is needed unless otherwise documented below in the visit note. 

## 2013-05-17 NOTE — Progress Notes (Signed)
Patient ID: Shelley Zuniga, female   DOB: May 20, 1950, 62 y.o.   MRN: [id number]   Patient Active Problem List   Diagnosis Date Noted  . Acute sinusitis treated with antibiotics in the past 60 days 05/18/2013  . Routine general medical examination at a health care facility 09/19/2012  . Screening for osteoporosis 04/14/2011  . Mixed hyperlipidemia 04/14/2011  . Screening for cervical cancer 04/13/2011  . Screening for breast cancer 04/13/2011  . Screening for colon cancer 04/13/2011  . Hypertension 03/27/2011    Subjective:  CC:   Chief Complaint  Patient presents with  . Acute Visit     pt states teeth have been hurting x 1wk nasal congestion yellow/ green  phelgm . started with scratchy throat, pt states been taking  mucinex and sudafed still no improvement.     HPI:   Shelley Zuniga a 62 y.o. female who presents with Persistent URI symptoms since November.   Sinus congestion, cough, drainage, headache.  Has been using sudafed PE, Advil and mucinex .  No resolution.  Now her teeth are hurting   Last use of abx was when she was treated for a UTI by [facility] on  Nov 29th with keflex for 7 days, ending on Dec 6     Past Medical History  Diagnosis Date  . Strabismus age 41    eye surgery  . Colon polyp 2001    colonoscopy, [facility]     Past Surgical History  Procedure Laterality Date  . Hemorrhoid surgery    . Gallbladder surgery    . Eye surgery  1956    to correct strabismus  . Bilateral oophorectomy  2000    fibroid cysts  . Abdominal hysterectomy  2000    [facility], [facility], fibroids/menorrhagia  . Basal cell carcinoma excision Left 2013    Dr. Orson Aloe    . methylPREDNISolone acetate  40 mg Intramuscular Once     The following portions of the patient's history were reviewed and updated as appropriate: Allergies, current medications, and problem list.    Review of Systems:   12 Pt  review of systems was negative except those addressed in the  HPI,     History   Social History  . Marital Status: Married    Spouse Name: N/A    Number of Children: N/A  . Years of Education: N/A   Occupational History  . Not on file.   Social History Main Topics  . Smoking status: Never Smoker   . Smokeless tobacco: Never Used  . Alcohol Use: No  . Drug Use: No  . Sexual Activity: Yes    Birth Control/ Protection: Post-menopausal   Other Topics Concern  . Not on file   Social History Narrative  . No narrative on file    Objective:  Filed Vitals:   05/17/13 0955  BP: 130/60  Pulse: 77  Temp: 98.2 F (36.8 C)     General appearance: alert, cooperative and appears stated age Face: bilateral maxillary tenderness Ears: TMS dull with serous effusions noted bilaterally,  Throat: lips, mucosa, and tongue normal; teeth and gums normal Neck: tender cervical adenopathy, no carotid bruit, supple, symmetrical, trachea midline and thyroid not enlarged, symmetric, no tenderness/mass/nodules Back: symmetric, no curvature. ROM normal. No CVA tenderness. Lungs: clear to auscultation bilaterally Heart: regular rate and rhythm, S1, S2 normal, no murmur, click, rub or gallop Abdomen: soft, non-tender; bowel sounds normal; no masses,  no organomegaly Pulses: 2+ and symmetric  Skin: Skin color, texture, turgor normal. No rashes or lesions Lymph nodes: Cervical, supraclavicular, and axillary nodes normal.  Assessment and Plan:  Acute sinusitis treated with antibiotics in the past 60 days Recent use of keflex  By Urgent Care for treatment of UTI. Given chronicity of symptoms, development of facial pain and exam consistent with bacterial maxillary sinusitis and otitis ,  Will treat with augmentin, prednisone, decongestants, and saline lavage.      Updated Medication List Outpatient Encounter Prescriptions as of 05/17/2013  Medication Sig  . aspirin 81 MG tablet Take 81 mg by mouth daily.    . cholecalciferol (VITAMIN D) 1000 UNITS tablet  Take 1,000 Units by mouth daily.    Tery Sanfilippo Calcium (STOOL SOFTENER PO) Take by mouth.    . estradiol (ESTRACE) 1 MG tablet Take 1 mg by mouth daily.    . fluticasone (FLONASE) 50 MCG/ACT nasal spray Place 2 sprays into the nose daily.    Marland Kitchen losartan (COZAAR) 50 MG tablet take 1 tablet by mouth once daily  . PREVIDENT 5000 DRY MOUTH 1.1 % GEL dental gel   . vitamin E 400 UNIT capsule Take 400 Units by mouth daily.    Marland Kitchen zoster vaccine live, PF, (ZOSTAVAX) 81191 UNT/0.65ML injection Inject 19,400 Units into the skin once.  Marland Kitchen amoxicillin-clavulanate (AUGMENTIN) 875-125 MG per tablet Take 1 tablet by mouth 2 (two) times daily.  . predniSONE (STERAPRED UNI-PAK) 10 MG tablet 6 tablets on Day 1 , then reduce by 1 tablet daily until gone     Orders Placed This Encounter  Procedures  . HM MAMMOGRAPHY  . HM PAP SMEAR    No Follow-up on file.

## 2013-05-17 NOTE — Patient Instructions (Signed)
You have a sinus/ear infection   .  I am prescribing an antibiotic (levaquin) and prednisone taper  To manage the infectin and the inflammation in your ear/sinuses.   I also advise use of the following OTC meds to help with your other symptoms.   Take generic OTC benadryl 25 mg every 8 hours for the drainage, (50 mg before bed for nighttime post nasal drip causimng cough)  Sudafed PE  10 to 30 mg every 8 hours for the congestion, you may substitute Afrin nasal spray for the nighttime dose of sudafed PE  If needed to prevent insomnia.  flushes your sinuses twice daily with Simply Saline (do over the sink because if you do it right you will spit out globs of mucus)  Use OTC  Delsym  If needed for daytime COUGH.  Gargle with salt water as needed for sore throat.   Please take a probiotic ( Align, Floraque or Culturelle) while you are on the antibiotic to prevent a serious antibiotic associated diarrhea  Called clostridium dificile colitis and a vaginal yeast infection

## 2013-05-18 DIAGNOSIS — J019 Acute sinusitis, unspecified: Secondary | ICD-10-CM | POA: Insufficient documentation

## 2013-05-18 NOTE — Assessment & Plan Note (Addendum)
Recent use of keflex  By Urgent Care for treatment of UTI. Given chronicity of symptoms, development of facial pain and exam consistent with bacterial maxillary sinusitis and otitis ,  Will treat with augmentin, prednisone, decongestants, and saline lavage.

## 2013-10-19 ENCOUNTER — Encounter (INDEPENDENT_AMBULATORY_CARE_PROVIDER_SITE_OTHER): Payer: Self-pay

## 2013-10-19 ENCOUNTER — Ambulatory Visit (INDEPENDENT_AMBULATORY_CARE_PROVIDER_SITE_OTHER)
Admission: RE | Admit: 2013-10-19 | Discharge: 2013-10-19 | Disposition: A | Discharge status: Home / Self Care | Payer: BC Managed Care – PPO | Source: Ambulatory Visit | Attending: Internal Medicine | Admitting: Internal Medicine

## 2013-10-19 ENCOUNTER — Ambulatory Visit (INDEPENDENT_AMBULATORY_CARE_PROVIDER_SITE_OTHER): Payer: BC Managed Care – PPO | Admitting: Internal Medicine

## 2013-10-19 ENCOUNTER — Encounter: Payer: Self-pay | Admitting: Internal Medicine

## 2013-10-19 VITALS — BP 138/78 | HR 73 | Temp 98.5°F | Resp 16 | Ht 64.0 in | Wt 160.5 lb

## 2013-10-19 DIAGNOSIS — M25562 Pain in left knee: Secondary | ICD-10-CM

## 2013-10-19 DIAGNOSIS — R5383 Other fatigue: Secondary | ICD-10-CM

## 2013-10-19 DIAGNOSIS — E559 Vitamin D deficiency, unspecified: Secondary | ICD-10-CM

## 2013-10-19 DIAGNOSIS — E538 Deficiency of other specified B group vitamins: Secondary | ICD-10-CM

## 2013-10-19 DIAGNOSIS — E785 Hyperlipidemia, unspecified: Secondary | ICD-10-CM

## 2013-10-19 DIAGNOSIS — M25569 Pain in unspecified knee: Secondary | ICD-10-CM

## 2013-10-19 DIAGNOSIS — G63 Polyneuropathy in diseases classified elsewhere: Secondary | ICD-10-CM

## 2013-10-19 DIAGNOSIS — I10 Essential (primary) hypertension: Secondary | ICD-10-CM

## 2013-10-19 DIAGNOSIS — G609 Hereditary and idiopathic neuropathy, unspecified: Secondary | ICD-10-CM

## 2013-10-19 DIAGNOSIS — Z2911 Encounter for prophylactic immunotherapy for respiratory syncytial virus (RSV): Secondary | ICD-10-CM

## 2013-10-19 DIAGNOSIS — R5381 Other malaise: Secondary | ICD-10-CM

## 2013-10-19 DIAGNOSIS — Z0000 Encounter for general adult medical examination without abnormal findings: Secondary | ICD-10-CM

## 2013-10-19 DIAGNOSIS — Z23 Encounter for immunization: Secondary | ICD-10-CM

## 2013-10-19 DIAGNOSIS — G629 Polyneuropathy, unspecified: Secondary | ICD-10-CM

## 2013-10-19 LAB — CBC WITH DIFFERENTIAL/PLATELET
BASOS ABS: 0 10*3/uL (ref 0.0–0.1)
Basophils Relative: 0.8 % (ref 0.0–3.0)
EOS ABS: 0.2 10*3/uL (ref 0.0–0.7)
Eosinophils Relative: 3.7 % (ref 0.0–5.0)
HEMATOCRIT: 40.8 % (ref 36.0–46.0)
Hemoglobin: 13.9 g/dL (ref 12.0–15.0)
LYMPHS ABS: 1.3 10*3/uL (ref 0.7–4.0)
Lymphocytes Relative: 25.9 % (ref 12.0–46.0)
MCHC: 34.2 g/dL (ref 30.0–36.0)
MCV: 94 fl (ref 78.0–100.0)
Monocytes Absolute: 0.4 10*3/uL (ref 0.1–1.0)
Monocytes Relative: 8.9 % (ref 3.0–12.0)
NEUTROS PCT: 60.7 % (ref 43.0–77.0)
Neutro Abs: 3 10*3/uL (ref 1.4–7.7)
Platelets: 262 10*3/uL (ref 150.0–400.0)
RBC: 4.33 Mil/uL (ref 3.87–5.11)
RDW: 12.5 % (ref 11.5–15.5)
WBC: 4.9 10*3/uL (ref 4.0–10.5)

## 2013-10-19 LAB — LIPID PANEL
CHOL/HDL RATIO: 4
Cholesterol: 219 mg/dL — ABNORMAL HIGH (ref 0–200)
HDL: 61.4 mg/dL (ref >39.00)
LDL Cholesterol: 123 mg/dL — ABNORMAL HIGH (ref 0–99)
NONHDL: 157.6
Triglycerides: 172 mg/dL — ABNORMAL HIGH (ref 0.0–149.0)
VLDL: 34.4 mg/dL (ref 0.0–40.0)

## 2013-10-19 LAB — COMPREHENSIVE METABOLIC PANEL
ALBUMIN: 3.9 g/dL (ref 3.5–5.2)
ALT: 13 U/L (ref 0–35)
AST: 13 U/L (ref 0–37)
Alkaline Phosphatase: 42 U/L (ref 39–117)
BUN: 14 mg/dL (ref 6–23)
CHLORIDE: 103 meq/L (ref 96–112)
CO2: 29 mEq/L (ref 19–32)
CREATININE: 0.7 mg/dL (ref 0.4–1.2)
Calcium: 9.6 mg/dL (ref 8.4–10.5)
GFR: 91.12 mL/min (ref >60.00)
Glucose, Bld: 96 mg/dL (ref 70–99)
POTASSIUM: 4.7 meq/L (ref 3.5–5.1)
Sodium: 137 mEq/L (ref 135–145)
Total Bilirubin: 0.9 mg/dL (ref 0.2–1.2)
Total Protein: 6.7 g/dL (ref 6.0–8.3)

## 2013-10-19 LAB — VITAMIN B12: Vitamin B-12: 136 pg/mL — ABNORMAL LOW (ref 211–911)

## 2013-10-19 LAB — TSH: TSH: 0.74 u[IU]/mL (ref 0.35–4.50)

## 2013-10-19 LAB — VITAMIN D 25 HYDROXY (VIT D DEFICIENCY, FRACTURES): VITD: 25.67 ng/mL

## 2013-10-19 MED ORDER — MELOXICAM 15 MG PO TABS: 15.0000 mg | ORAL_TABLET | Freq: Every day | ORAL | Status: DC

## 2013-10-19 NOTE — Patient Instructions (Addendum)
Your left knee pain may be coming from traumatic bursitis, chondromalacia, or from a partially ruptured anterior cruciate ligament  I recommend a 4 week trial of NSAIDs,  analegesics and home exercises:  daily meloxicam 15 mg (do not combine with Aleve or Motrin) prn tylenol up to 2000 mg daily maximum dose  (500 mg every 6 hours as needed ) Knee extensions and hamstring curls , both without weights Avoid lunges and squatting  Plain x rays to be done at [facility] office at [facility]  If no improvement in 4 weeks.,  Call for MRI vs referral to [facility]

## 2013-10-19 NOTE — Progress Notes (Signed)
Pre-visit discussion using our clinic review tool. No additional management support is needed unless otherwise documented below in the visit note.  

## 2013-10-19 NOTE — Progress Notes (Signed)
Patient ID: Shelley Zuniga, female   DOB: [birth year], 63 y.o.   MRN: [id number]    Subjective:    Shelley Zuniga is a 63 y.o. female who presents for an annual exam. The patient has no complaints today. The patient is sexually active. GYN screening history: last pap: was normal and approximate date Nov 2014 and was normal. The patient wears seatbelts: yes. The patient participates in regular exercise: yes. Has the patient ever been transfused or tattooed?: no. The patient reports that there is not domestic violence in her life.   Menstrual History: OB History   Grav Para Term Preterm Abortions TAB SAB Ect Mult Living                  Menarche age: [age]  No LMP recorded. Patient has had a hysterectomy.    The following portions of the patient's history were reviewed and updated as appropriate: allergies, current medications, past family history, past medical history, past social history, past surgical history and problem list.  Review of Systems A comprehensive review of systems was negative.    Objective:  BP 138/78  Pulse 73  Temp(Src) 98.5 F (36.9 C) (Oral)  Resp 16  Ht 5\' 4"  (1.626 m)  Wt 160 lb 8 oz (72.802 kg)  BMI 27.54 kg/m2  SpO2 98%   General appearance: alert, cooperative and appears stated age Ears: normal TM's and external ear canals both ears Throat: lips, mucosa, and tongue normal; teeth and gums normal Neck: no adenopathy, no carotid bruit, supple, symmetrical, trachea midline and thyroid not enlarged, symmetric, no tenderness/mass/nodules Back: symmetric, no curvature. ROM normal. No CVA tenderness. Lungs: clear to auscultation bilaterally Heart: regular rate and rhythm, S1, S2 normal, no murmur, click, rub or gallop Abdomen: soft, non-tender; bowel sounds normal; no masses,  no organomegaly Pulses: 2+ and symmetric MSK: knees asymmetric,  Left is 0.5 cm larger,  No effusion,  Some crepitus Skin: Skin color, texture, turgor normal. No rashes or lesions Lymph  nodes: Cervical, supraclavicular, and axillary nodes normal.   Assessment and plan:   Routine general medical examination at a health care facility Annual comprehensive exam was done excluding breast, pelvic and PAP smear. All screenings have been addressed .   B12 neuropathy Lab Results  Component Value Date   VITAMINB12 136* 10/19/2013    Diagnosed today.  Patinet will return ASAP for b12 injections weekly x 3.    Vitamin D deficiency Mild,  D is 20  One month of Drisdol rx'd weekly,  Then 1000 units daily   Knee pain, left anterior Secondary to blunt trauma.  Palin films normal,  Suspect ACL partial tear vs resolving bursitis.  NSAIDs and knee extensions/hamstring strengthening exercises recommended (without weight).  If no improvement in one month, referral to sports medicine and PT  Hypertension Well controlled on current regimen. Renal function stable, no changes today.  Lab Results  Component Value Date   CREATININE 0.7 10/19/2013    Lab Results  Component Value Date   NA 137 10/19/2013   K 4.7 10/19/2013   CL 103 10/19/2013   CO2 29 10/19/2013     Other and unspecified hyperlipidemia Well controlled on current diet alone.    Updated Medication List Outpatient Encounter Prescriptions as of 10/19/2013  Medication Sig  . aspirin 81 MG tablet Take 81 mg by mouth daily.    . cholecalciferol (VITAMIN D) 1000 UNITS tablet Take 1,000 Units by mouth daily.    Tery Sanfilippo. Docusate  Calcium (STOOL SOFTENER PO) Take by mouth.    . estradiol (ESTRACE) 1 MG tablet Take 1 mg by mouth daily.    . fluticasone (FLONASE) 50 MCG/ACT nasal spray Place 2 sprays into the nose daily.    Marland Kitchen losartan (COZAAR) 50 MG tablet take 1 tablet by mouth once daily  . PREVIDENT 5000 DRY MOUTH 1.1 % GEL dental gel   . vitamin E 400 UNIT capsule Take 400 Units by mouth daily.    . Cyanocobalamin 1000 MCG SUBL Place 1 tablet (1,000 mcg total) under the tongue daily.  . ergocalciferol (DRISDOL) 50000 UNITS capsule  Take 1 capsule (50,000 Units total) by mouth once a week.  . meloxicam (MOBIC) 15 MG tablet Take 1 tablet (15 mg total) by mouth daily.  Marland Kitchen zoster vaccine live, PF, (ZOSTAVAX) 40086 UNT/0.65ML injection Inject 19,400 Units into the skin once.  . [DISCONTINUED] amoxicillin-clavulanate (AUGMENTIN) 875-125 MG per tablet Take 1 tablet by mouth 2 (two) times daily.  . [DISCONTINUED] predniSONE (STERAPRED UNI-PAK) 10 MG tablet 6 tablets on Day 1 , then reduce by 1 tablet daily until gone

## 2013-10-20 ENCOUNTER — Encounter: Payer: Self-pay | Admitting: Internal Medicine

## 2013-10-20 DIAGNOSIS — E559 Vitamin D deficiency, unspecified: Secondary | ICD-10-CM | POA: Insufficient documentation

## 2013-10-20 DIAGNOSIS — M25562 Pain in left knee: Secondary | ICD-10-CM | POA: Insufficient documentation

## 2013-10-20 DIAGNOSIS — E785 Hyperlipidemia, unspecified: Secondary | ICD-10-CM | POA: Insufficient documentation

## 2013-10-20 DIAGNOSIS — G63 Polyneuropathy in diseases classified elsewhere: Secondary | ICD-10-CM

## 2013-10-20 DIAGNOSIS — E538 Deficiency of other specified B group vitamins: Secondary | ICD-10-CM | POA: Insufficient documentation

## 2013-10-20 HISTORY — DX: Deficiency of other specified B group vitamins: E53.8

## 2013-10-20 LAB — FOLATE, RBC AND SERUM
Folate, Hemolysate: 383 ng/mL
Folate, RBC: 925 ng/mL (ref 499–1504)
Folate: 10.8 ng/mL (ref >3.0)
HCT: 41.4 % (ref 34.0–46.6)

## 2013-10-20 MED ORDER — ERGOCALCIFEROL 1.25 MG (50000 UT) PO CAPS: 50000.0000 [IU] | ORAL_CAPSULE | ORAL | Status: DC

## 2013-10-20 MED ORDER — CYANOCOBALAMIN 1000 MCG SL SUBL: 1.0000 | SUBLINGUAL_TABLET | Freq: Every day | SUBLINGUAL | Status: DC

## 2013-10-20 NOTE — Assessment & Plan Note (Signed)
Secondary to blunt trauma.  Palin films normal,  Suspect ACL partial tear vs resolving bursitis.  NSAIDs and knee extensions/hamstring strengthening exercises recommended (without weight).  If no improvement in one month, referral to sports medicine and PT

## 2013-10-20 NOTE — Assessment & Plan Note (Signed)
Mild,  D is 20  One month of Drisdol rx'd weekly,  Then 1000 units daily

## 2013-10-20 NOTE — Assessment & Plan Note (Addendum)
Well controlled on current diet alone.  Lab Results  Component Value Date   CHOL 219* 10/19/2013   HDL 61.40 10/19/2013   LDLCALC 123* 10/19/2013   LDLDIRECT 128.1 10/03/2012   TRIG 172.0* 10/19/2013   CHOLHDL 4 10/19/2013

## 2013-10-20 NOTE — Assessment & Plan Note (Addendum)
Annual comprehensive exam was done excluding breast, pelvic and PAP smear. All screenings have been addressed .  

## 2013-10-20 NOTE — Assessment & Plan Note (Signed)
Well controlled on current regimen. Renal function stable, no changes today.  Lab Results  Component Value Date   CREATININE 0.7 10/19/2013    Lab Results  Component Value Date   NA 137 10/19/2013   K 4.7 10/19/2013   CL 103 10/19/2013   CO2 29 10/19/2013

## 2013-10-20 NOTE — Assessment & Plan Note (Signed)
Lab Results  Component Value Date   VITAMINB12 136* 10/19/2013    Diagnosed today.  Patinet will return ASAP for b12 injections weekly x 3.

## 2013-10-23 ENCOUNTER — Ambulatory Visit (INDEPENDENT_AMBULATORY_CARE_PROVIDER_SITE_OTHER): Payer: BC Managed Care – PPO | Admitting: *Deleted

## 2013-10-23 DIAGNOSIS — E538 Deficiency of other specified B group vitamins: Secondary | ICD-10-CM

## 2013-10-23 MED ORDER — CYANOCOBALAMIN 1000 MCG/ML IJ SOLN
1000.0000 ug | Freq: Once | INTRAMUSCULAR | Status: AC
  Administered 2013-10-23: 1000 ug via INTRAMUSCULAR

## 2013-10-24 ENCOUNTER — Ambulatory Visit: Payer: BC Managed Care – PPO

## 2013-10-29 ENCOUNTER — Other Ambulatory Visit: Payer: Self-pay | Admitting: Internal Medicine

## 2013-10-31 ENCOUNTER — Encounter: Payer: Self-pay | Admitting: Adult Health

## 2013-10-31 ENCOUNTER — Ambulatory Visit (INDEPENDENT_AMBULATORY_CARE_PROVIDER_SITE_OTHER): Payer: BC Managed Care – PPO | Admitting: Adult Health

## 2013-10-31 ENCOUNTER — Ambulatory Visit (INDEPENDENT_AMBULATORY_CARE_PROVIDER_SITE_OTHER): Payer: BC Managed Care – PPO

## 2013-10-31 VITALS — BP 128/74 | HR 67 | Temp 98.3°F | Resp 14 | Wt 163.5 lb

## 2013-10-31 DIAGNOSIS — R300 Dysuria: Secondary | ICD-10-CM

## 2013-10-31 DIAGNOSIS — E538 Deficiency of other specified B group vitamins: Secondary | ICD-10-CM

## 2013-10-31 LAB — POCT URINALYSIS DIPSTICK
Bilirubin, UA: NEGATIVE
Glucose, UA: 100
Ketones, UA: NEGATIVE
NITRITE UA: POSITIVE
PH UA: 7
PROTEIN UA: NEGATIVE
Spec Grav, UA: 1.01
Urobilinogen, UA: 1

## 2013-10-31 MED ORDER — PHENAZOPYRIDINE HCL 200 MG PO TABS: 200.0000 mg | ORAL_TABLET | Freq: Three times a day (TID) | ORAL | Status: DC | PRN

## 2013-10-31 MED ORDER — CIPROFLOXACIN HCL 250 MG PO TABS: 250.0000 mg | ORAL_TABLET | Freq: Two times a day (BID) | ORAL | Status: DC

## 2013-10-31 MED ORDER — CYANOCOBALAMIN 1000 MCG/ML IJ SOLN
1000.0000 ug | Freq: Once | INTRAMUSCULAR | Status: AC
  Administered 2013-10-31: 1000 ug via INTRAMUSCULAR

## 2013-10-31 NOTE — Progress Notes (Signed)
Patient ID: Shelley CoryKaren K Demilio, female   DOB: 11/20/1950, 63 y.o.   MRN: [id number]   Subjective:    Patient ID: Shelley Zuniga, female    DOB: 11/20/1950, 63 y.o.   MRN: [id number]  HPI  Pt is a pleasant 63 y/o female who presents to clinic with dysuria and frequency which started yesterday. Denies fever, chills, hematuria. No OTC products.   Past Medical History  Diagnosis Date  . Strabismus age 63    eye surgery  . Colon polyp 2001    colonoscopy, [facility]     Current Outpatient Prescriptions on File Prior to Visit  Medication Sig Dispense Refill  . aspirin 81 MG tablet Take 81 mg by mouth daily.        . cholecalciferol (VITAMIN D) 1000 UNITS tablet Take 1,000 Units by mouth daily.        . Cyanocobalamin 1000 MCG SUBL Place 1 tablet (1,000 mcg total) under the tongue daily.  90 tablet  3  . Docusate Calcium (STOOL SOFTENER PO) Take by mouth.        . ergocalciferol (DRISDOL) 50000 UNITS capsule Take 1 capsule (50,000 Units total) by mouth once a week.  4 capsule  0  . estradiol (ESTRACE) 1 MG tablet Take 1 mg by mouth daily.        . fluticasone (FLONASE) 50 MCG/ACT nasal spray Place 2 sprays into the nose daily.        Marland Kitchen. losartan (COZAAR) 50 MG tablet take 1 tablet by mouth once daily  30 tablet  5  . meloxicam (MOBIC) 15 MG tablet Take 1 tablet (15 mg total) by mouth daily.  30 tablet  2  . PREVIDENT 5000 DRY MOUTH 1.1 % GEL dental gel       . vitamin E 400 UNIT capsule Take 400 Units by mouth daily.        Marland Kitchen. zoster vaccine live, PF, (ZOSTAVAX) [id number] UNT/0.65ML injection Inject 19,400 Units into the skin once.  1 each  0   Current Facility-Administered Medications on File Prior to Visit  Medication Dose Route Frequency Jamira Barfuss Last Rate Last Dose  . methylPREDNISolone acetate (DEPO-MEDROL) injection 40 mg  40 mg Intramuscular Once Sherlene Shamseresa L Tullo, MD         Review of Systems  Constitutional: Negative for fever and chills.  Genitourinary: Positive for dysuria, urgency and  frequency. Negative for hematuria and flank pain.       Objective:  BP 128/74  Pulse 67  Temp(Src) 98.3 F (36.8 C) (Oral)  Resp 14  Wt 163 lb 8 oz (74.163 kg)  SpO2 95%   Physical Exam  Constitutional: She is oriented to person, place, and time. No distress.  Cardiovascular: Normal rate and regular rhythm.   Pulmonary/Chest: Effort normal. No respiratory distress.  Musculoskeletal: Normal range of motion.  Neurological: She is alert and oriented to person, place, and time.  Skin: Skin is warm and dry.  Psychiatric: She has a normal mood and affect. Her behavior is normal. Judgment and thought content normal.      Assessment & Plan:   1. Dysuria UA positive for nitrites. Send for culture. Start Cipro and pyridium as directed. RTC if no improvement in 4-5 days. - POCT urinalysis dipstick - Urine culture

## 2013-10-31 NOTE — Progress Notes (Signed)
Pre visit review using our clinic review tool, if applicable. No additional management support is needed unless otherwise documented below in the visit note. 

## 2013-10-31 NOTE — Patient Instructions (Signed)
  Start Cipro 250 twice a day for 7 days.  Pyridium 200 mg 3 times a day as needed for painful urination.  This medication will turn your urine orange.  Call if no improvement within 4-5 days.

## 2013-11-03 ENCOUNTER — Encounter: Payer: Self-pay | Admitting: Adult Health

## 2013-11-03 LAB — URINE CULTURE: Colony Count: >=100000

## 2013-11-07 ENCOUNTER — Ambulatory Visit: Payer: BC Managed Care – PPO

## 2013-11-07 ENCOUNTER — Ambulatory Visit (INDEPENDENT_AMBULATORY_CARE_PROVIDER_SITE_OTHER): Payer: BC Managed Care – PPO | Admitting: *Deleted

## 2013-11-07 DIAGNOSIS — E538 Deficiency of other specified B group vitamins: Secondary | ICD-10-CM

## 2013-11-07 MED ORDER — CYANOCOBALAMIN 1000 MCG/ML IJ SOLN
1000.0000 ug | Freq: Once | INTRAMUSCULAR | Status: AC
  Administered 2013-11-07: 1000 ug via INTRAMUSCULAR

## 2014-02-10 ENCOUNTER — Ambulatory Visit (INDEPENDENT_AMBULATORY_CARE_PROVIDER_SITE_OTHER): Payer: BC Managed Care – PPO

## 2014-02-10 DIAGNOSIS — Z23 Encounter for immunization: Secondary | ICD-10-CM

## 2014-02-27 ENCOUNTER — Telehealth: Payer: Self-pay | Admitting: Internal Medicine

## 2014-02-27 NOTE — Telephone Encounter (Signed)
FYI

## 2014-02-27 NOTE — Telephone Encounter (Signed)
Patient Information:  Caller Name: Clydie BraunKaren  Phone: [phone]  Patient: Shelley Zuniga, Shelley Zuniga  Gender: Female  DOB: [birth year]  Age: [age] Years  PCP: Duncan Dullullo, Teresa (Adults only)  Office Follow Up:  Does the office need to follow up with this patient?: No  Instructions For The Office: N/A  RN Note:  Pt agrees to an appt for tomorrow or Thursday.  Symptoms  Reason For Call & Symptoms: Pt is calling and states that her BP is elevated; sx started last week;  she is also having headaches; BP 149/97 today 02/27/2014 at 0930 after BP medications that were taken at 0730; headache 4/10 at present time; under stress with husband having an injury  Reviewed Health History In EMR: Yes  Reviewed Medications In EMR: Yes  Reviewed Allergies In EMR: Yes  Reviewed Surgeries / Procedures: Yes  Date of Onset of Symptoms: 02/20/2014  Guideline(s) Used:  High Blood Pressure  Disposition Per Guideline:   See Within 2 Weeks in Office  Reason For Disposition Reached:   BP > 140/90 and is taking BP medications  Advice Given:  Call Back If:  Headache, blurred vision, difficulty talking, or difficulty walking occurs  Chest pain or difficulty breathing occurs  You want to go in to the office for a blood pressure check  You become worse.  Patient Will Follow Care Advice:  YES  Appointment Scheduled:  02/28/2014 13:00:00 Appointment Scheduled Provider:  Other[facility] [place] [facility]

## 2014-02-28 ENCOUNTER — Ambulatory Visit (INDEPENDENT_AMBULATORY_CARE_PROVIDER_SITE_OTHER): Payer: BC Managed Care – PPO | Admitting: Internal Medicine

## 2014-02-28 ENCOUNTER — Encounter: Payer: Self-pay | Admitting: Internal Medicine

## 2014-02-28 VITALS — BP 146/78 | HR 69 | Temp 98.5°F | Wt 166.0 lb

## 2014-02-28 DIAGNOSIS — I10 Essential (primary) hypertension: Secondary | ICD-10-CM

## 2014-02-28 NOTE — Patient Instructions (Signed)

## 2014-02-28 NOTE — Assessment & Plan Note (Signed)
BP today fairly well controlled Rechecked 132/74 Continue Cozaar CBC and CMET from 10/2013 reviewed Keep a log of blood pressures- RTC if remains elevated

## 2014-02-28 NOTE — Progress Notes (Signed)
Subjective:    Patient ID: Shelley CoryKaren K Dial, female    DOB: 12/03/1950, 63 y.o.   MRN: [id number]  HPI  Pt presents to the clinic today with c/o elevated blood pressures. She noticed this 1 week ago. Blood pressures have been as high as 164/97 using her home machine. She has been having headaches. She denies blurred vision, dizziness, chest pain or shortness of breath. She does report feeling more stressed out than normal. Her husband recently had an injury. She is taking her Cozaar daily as directed.  Review of Systems      Past Medical History  Diagnosis Date  . Strabismus age 83    eye surgery  . Colon polyp 2001    colonoscopy, [facility]     Current Outpatient Prescriptions  Medication Sig Dispense Refill  . aspirin 81 MG tablet Take 81 mg by mouth daily.        . cholecalciferol (VITAMIN D) 1000 UNITS tablet Take 1,000 Units by mouth daily.        . Cyanocobalamin 1000 MCG SUBL Place 1 tablet (1,000 mcg total) under the tongue daily.  90 tablet  3  . Docusate Calcium (STOOL SOFTENER PO) Take by mouth.        . ergocalciferol (DRISDOL) 50000 UNITS capsule Take 1 capsule (50,000 Units total) by mouth once a week.  4 capsule  0  . estradiol (ESTRACE) 1 MG tablet Take 1 mg by mouth daily.        . fluticasone (FLONASE) 50 MCG/ACT nasal spray Place 2 sprays into the nose daily.        Marland Kitchen. losartan (COZAAR) 50 MG tablet take 1 tablet by mouth once daily  30 tablet  5  . meloxicam (MOBIC) 15 MG tablet Take 1 tablet (15 mg total) by mouth daily.  30 tablet  2  . phenazopyridine (PYRIDIUM) 200 MG tablet Take 1 tablet (200 mg total) by mouth 3 (three) times daily as needed for pain.  10 tablet  0  . PREVIDENT 5000 DRY MOUTH 1.1 % GEL dental gel       . vitamin E 400 UNIT capsule Take 400 Units by mouth daily.        Marland Kitchen. zoster vaccine live, PF, (ZOSTAVAX) [id number] UNT/0.65ML injection Inject 19,400 Units into the skin once.  1 each  0   Current Facility-Administered Medications  Medication Dose  Route Frequency Provider Last Rate Last Dose  . methylPREDNISolone acetate (DEPO-MEDROL) injection 40 mg  40 mg Intramuscular Once Sherlene Shamseresa L Tullo, MD        Allergies  Allergen Reactions  . Morphine And Related Itching  . Sulfa Antibiotics Swelling    Tongue    Family History  Problem Relation Age of Onset  . Cancer Mother     melanoma, squamous cell on nose  . Cancer Father     lung, tobacco user  . Cancer Brother     throat, tobacco user  . Cancer Maternal Grandmother     uterine/ovarian    History   Social History  . Marital Status: Married    Spouse Name: N/A    Number of Children: N/A  . Years of Education: N/A   Occupational History  . Not on file.   Social History Main Topics  . Smoking status: Never Smoker   . Smokeless tobacco: Never Used  . Alcohol Use: No  . Drug Use: No  . Sexual Activity: Yes    Birth Control/ Protection: Post-menopausal  Other Topics Concern  . Not on file   Social History Narrative  . No narrative on file     Constitutional: Pt reports headache. Denies fever, malaise, fatigue, or abrupt weight changes.  Respiratory: Denies difficulty breathing, shortness of breath, cough or sputum production.   Cardiovascular: Denies chest pain, chest tightness, palpitations or swelling in the hands or feet.  Neurological: Denies dizziness, difficulty with memory, difficulty with speech or problems with balance and coordination.   No other specific complaints in a complete review of systems (except as listed in HPI above).  Objective:   Physical Exam   BP 146/78  Pulse 69  Temp(Src) 98.5 F (36.9 C) (Oral)  Wt 166 lb (75.297 kg)  SpO2 98% Wt Readings from Last 3 Encounters:  02/28/14 166 lb (75.297 kg)  10/31/13 163 lb 8 oz (74.163 kg)  10/19/13 160 lb 8 oz (72.802 kg)    General: Appears her stated age, well developed, well nourished in NAD. Cardiovascular: Normal rate and rhythm. S1,S2 noted.  Murmur noted. No rubs or gallops  noted.  Pulmonary/Chest: Normal effort and positive vesicular breath sounds. No respiratory distress. No wheezes, rales or ronchi noted.  Neurological: Alert and oriented.    BMET    Component Value Date/Time   NA 137 10/19/2013 0957   K 4.7 10/19/2013 0957   CL 103 10/19/2013 0957   CO2 29 10/19/2013 0957   GLUCOSE 96 10/19/2013 0957   BUN 14 10/19/2013 0957   CREATININE 0.7 10/19/2013 0957   CREATININE 0.72 10/14/2011 0819   CALCIUM 9.6 10/19/2013 0957   GFRNONAA >89 10/14/2011 0819   GFRAA >89 10/14/2011 0819    Lipid Panel     Component Value Date/Time   CHOL 219* 10/19/2013 0957   TRIG 172.0* 10/19/2013 0957   HDL 61.40 10/19/2013 0957   CHOLHDL 4 10/19/2013 0957   VLDL 34.4 10/19/2013 0957   LDLCALC 123* 10/19/2013 0957    CBC    Component Value Date/Time   WBC 4.9 10/19/2013 0957   RBC 4.33 10/19/2013 0957   HGB 13.9 10/19/2013 0957   HCT 40.8 10/19/2013 0957   HCT 41.4 10/19/2013 0957   PLT 262.0 10/19/2013 0957   MCV 94.0 10/19/2013 0957   MCHC 34.2 10/19/2013 0957   RDW 12.5 10/19/2013 0957   LYMPHSABS 1.3 10/19/2013 0957   MONOABS 0.4 10/19/2013 0957   EOSABS 0.2 10/19/2013 0957   BASOSABS 0.0 10/19/2013 0957    Hgb A1C No results found for this basename: HGBA1C        Assessment & Plan:

## 2014-02-28 NOTE — Progress Notes (Signed)
Pre visit review using our clinic review tool, if applicable. No additional management support is needed unless otherwise documented below in the visit note. 

## 2014-03-01 ENCOUNTER — Telehealth: Payer: Self-pay | Admitting: Internal Medicine

## 2014-03-01 NOTE — Telephone Encounter (Signed)
EMMI EMAILED  °

## 2014-03-20 ENCOUNTER — Encounter: Payer: Self-pay | Admitting: Internal Medicine

## 2014-03-20 MED ORDER — LOSARTAN POTASSIUM 50 MG PO TABS: ORAL_TABLET | ORAL | Status: DC

## 2014-03-20 NOTE — Telephone Encounter (Signed)
See mychart message. Rx sent.

## 2014-09-21 ENCOUNTER — Other Ambulatory Visit: Payer: Self-pay | Admitting: Internal Medicine

## 2014-10-01 ENCOUNTER — Ambulatory Visit (INDEPENDENT_AMBULATORY_CARE_PROVIDER_SITE_OTHER): Payer: BC Managed Care – PPO | Admitting: Internal Medicine

## 2014-10-01 ENCOUNTER — Encounter: Payer: Self-pay | Admitting: Internal Medicine

## 2014-10-01 VITALS — BP 168/90 | HR 73 | Temp 98.4°F | Resp 16 | Ht 64.0 in | Wt 167.5 lb

## 2014-10-01 DIAGNOSIS — B029 Zoster without complications: Secondary | ICD-10-CM | POA: Diagnosis not present

## 2014-10-01 MED ORDER — ACYCLOVIR 200 MG PO CAPS: 200.0000 mg | ORAL_CAPSULE | Freq: Every day | ORAL | Status: DC

## 2014-10-01 NOTE — Progress Notes (Signed)
Patient ID: Shelley CoryKaren K Willems, female   DOB: July 12, 1950, 64 y.o.   MRN: [id number]

## 2014-10-01 NOTE — Patient Instructions (Signed)
I am treating you for Shingles BEFORE you develop the rash to hopefully prevent the rash  You are contagious to your daughter's unborn child  and anyone else who has not been vaccinated IF you break out in a rash .  The virus has been spread both with contact and air borne spread  I would avoid contact with your 608 yr old grandchild if you break out in a rash ,  Until the rash has resolved.

## 2014-10-03 DIAGNOSIS — B029 Zoster without complications: Secondary | ICD-10-CM | POA: Insufficient documentation

## 2014-10-03 NOTE — Assessment & Plan Note (Addendum)
She has no rash yet, but has the characteristic pain.  Acyclivir prescribed,  Contact precations discussed.  A total of 25 minutes of face to face time was spent with patient more than half of which was spent in counselling about the above mentioned conditions  and coordination of care

## 2014-10-03 NOTE — Progress Notes (Signed)
Subjective:  Patient ID: Shelley Zuniga, female    DOB: 11/12/1950  Age: 64 y.o. MRN: [id number]  CC: The encounter diagnosis was Shingles outbreak.  HPI Shelley Zuniga presents for evaluation of new onset burniing pain on her right upper back and chest.  The pain reminds her of prior shingles epiisode many years ago. Denies chest pain , dyspnea and fevers..  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   Outpatient Prescriptions Prior to Visit  Medication Sig Dispense Refill  . aspirin 81 MG tablet Take 81 mg by mouth daily.      . cholecalciferol (VITAMIN D) 1000 UNITS tablet Take 1,000 Units by mouth daily.      . Cyanocobalamin 1000 MCG SUBL Place 1 tablet (1,000 mcg total) under the tongue daily. 90 tablet 3  . Docusate Calcium (STOOL SOFTENER PO) Take by mouth.      . estradiol (ESTRACE) 1 MG tablet Take 1 mg by mouth daily.      . fluticasone (FLONASE) 50 MCG/ACT nasal spray Place 2 sprays into the nose daily.      Marland Kitchen. losartan (COZAAR) 50 MG tablet take 1 tablet by mouth once daily 90 tablet 1  . PREVIDENT 5000 DRY MOUTH 1.1 % GEL dental gel     . vitamin E 400 UNIT capsule Take 400 Units by mouth daily.      Marland Kitchen. zoster vaccine live, PF, (ZOSTAVAX) [id number] UNT/0.65ML injection Inject 19,400 Units into the skin once. 1 each 0   Facility-Administered Medications Prior to Visit  Medication Dose Route Frequency Provider Last Rate Last Dose  . methylPREDNISolone acetate (DEPO-MEDROL) injection 40 mg  40 mg Intramuscular Once Sherlene Shamseresa L Verlee Pope, MD        Review of Systems;  Patient denies headache, fevers, malaise, unintentional weight loss, skin rash, eye pain, sinus congestion and sinus pain, sore throat, dysphagia,  hemoptysis , cough, dyspnea, wheezing, chest pain, palpitations, orthopnea, edema, abdominal pain, nausea, melena, diarrhea, constipation, flank pain, dysuria, hematuria, urinary  Frequency, nocturia, numbness, tingling, seizures,  Focal weakness, Loss of  consciousness,  Tremor, insomnia, depression, anxiety, and suicidal ideation.      Objective:  BP 168/90 mmHg  Pulse 73  Temp(Src) 98.4 F (36.9 C) (Oral)  Resp 16  Ht 5\' 4"  (1.626 m)  Wt 167 lb 8 oz (75.978 kg)  BMI 28.74 kg/m2  SpO2 98%  BP Readings from Last 3 Encounters:  10/01/14 168/90  02/28/14 146/78  10/31/13 128/74    Wt Readings from Last 3 Encounters:  10/01/14 167 lb 8 oz (75.978 kg)  02/28/14 166 lb (75.297 kg)  10/31/13 163 lb 8 oz (74.163 kg)    General appearance: alert, cooperative and appears stated age Ears: normal TM's and external ear canals both ears Throat: lips, mucosa, and tongue normal; teeth and gums normal Neck: no adenopathy, no carotid bruit, supple, symmetrical, trachea midline and thyroid not enlarged, symmetric, no tenderness/mass/nodules Back: symmetric, no curvature. ROM normal. No CVA tenderness. Lungs: clear to auscultation bilaterally Heart: regular rate and rhythm, S1, S2 normal, no murmur, click, rub or gallop Abdomen: soft, non-tender; bowel sounds normal; no masses,  no organomegaly Pulses: 2+ and symmetric Skin: Skin color, texture, turgor normal. No rashes or lesions Lymph nodes: Cervical, supraclavicular, and axillary nodes normal.  No results found for: HGBA1C  Lab Results  Component Value Date   CREATININE 0.7 10/19/2013   CREATININE 0.8 10/03/2012   CREATININE 0.72 10/14/2011  Lab Results  Component Value Date   WBC 4.9 10/19/2013   HGB 13.9 10/19/2013   HCT 40.8 10/19/2013   HCT 41.4 10/19/2013   PLT 262.0 10/19/2013   GLUCOSE 96 10/19/2013   CHOL 219* 10/19/2013   TRIG 172.0* 10/19/2013   HDL 61.40 10/19/2013   LDLDIRECT 128.1 10/03/2012   LDLCALC 123* 10/19/2013   ALT 13 10/19/2013   AST 13 10/19/2013   NA 137 10/19/2013   K 4.7 10/19/2013   CL 103 10/19/2013   CREATININE 0.7 10/19/2013   BUN 14 10/19/2013   CO2 29 10/19/2013   TSH 0.74 10/19/2013   MICROALBUR 0.6 04/06/2011    Dg Knee  Complete 4 Views Left  10/19/2013   CLINICAL DATA:  Larey SeatFell 1 month ago with pain  EXAM: LEFT KNEE - COMPLETE 4+ VIEW  COMPARISON:  None.  FINDINGS: The left knee joint spaces appear normal. No fracture is seen. No effusion is noted. Calcification is noted in the soft tissues of the left knee medially just below the level of the knee joint most likely vascular in origin.  IMPRESSION: Negative.   Electronically Signed   By: Dwyane DeePaul  Barry M.D.   On: 10/19/2013 13:07    Assessment & Plan:   Problem List Items Addressed This Visit    Shingles outbreak - Primary   Relevant Medications   acyclovir (ZOVIRAX) 200 MG capsule      I am having [name] start on acyclovir. I am also having her maintain her cholecalciferol, estradiol, vitamin E, aspirin, Docusate Calcium (STOOL SOFTENER PO), fluticasone, zoster vaccine live (PF), PREVIDENT 5000 DRY MOUTH, Cyanocobalamin, and losartan. We will continue to administer methylPREDNISolone acetate.  Meds ordered this encounter  Medications  . acyclovir (ZOVIRAX) 200 MG capsule    Sig: Take 1 capsule (200 mg total) by mouth 5 (five) times daily.    Dispense:  35 capsule    Refill:  0    There are no discontinued medications.  Follow-up: No Follow-up on file.   Sherlene ShamsULLO, Nawal Burling L, MD

## 2014-10-24 ENCOUNTER — Ambulatory Visit: Payer: BC Managed Care – PPO | Admitting: Internal Medicine

## 2014-10-25 ENCOUNTER — Ambulatory Visit (INDEPENDENT_AMBULATORY_CARE_PROVIDER_SITE_OTHER): Payer: BC Managed Care – PPO | Admitting: Internal Medicine

## 2014-10-25 VITALS — BP 142/84 | HR 67 | Temp 98.1°F | Resp 14 | Ht 64.0 in | Wt 165.1 lb

## 2014-10-25 DIAGNOSIS — E538 Deficiency of other specified B group vitamins: Secondary | ICD-10-CM | POA: Diagnosis not present

## 2014-10-25 DIAGNOSIS — E785 Hyperlipidemia, unspecified: Secondary | ICD-10-CM | POA: Diagnosis not present

## 2014-10-25 DIAGNOSIS — B029 Zoster without complications: Secondary | ICD-10-CM

## 2014-10-25 DIAGNOSIS — E782 Mixed hyperlipidemia: Secondary | ICD-10-CM

## 2014-10-25 DIAGNOSIS — G629 Polyneuropathy, unspecified: Secondary | ICD-10-CM

## 2014-10-25 DIAGNOSIS — I10 Essential (primary) hypertension: Secondary | ICD-10-CM

## 2014-10-25 LAB — COMPREHENSIVE METABOLIC PANEL
ALBUMIN: 4.1 g/dL (ref 3.5–5.2)
ALT: 11 U/L (ref 0–35)
AST: 11 U/L (ref 0–37)
Alkaline Phosphatase: 49 U/L (ref 39–117)
BUN: 12 mg/dL (ref 6–23)
CO2: 29 mEq/L (ref 19–32)
Calcium: 9.3 mg/dL (ref 8.4–10.5)
Chloride: 102 mEq/L (ref 96–112)
Creatinine, Ser: 0.69 mg/dL (ref 0.40–1.20)
GFR: 90.83 mL/min (ref >60.00)
Glucose, Bld: 100 mg/dL — ABNORMAL HIGH (ref 70–99)
Potassium: 4.2 mEq/L (ref 3.5–5.1)
SODIUM: 137 meq/L (ref 135–145)
Total Bilirubin: 0.5 mg/dL (ref 0.2–1.2)
Total Protein: 7 g/dL (ref 6.0–8.3)

## 2014-10-25 LAB — MICROALBUMIN / CREATININE URINE RATIO
Creatinine,U: 77.8 mg/dL
MICROALB/CREAT RATIO: 0.9 mg/g (ref 0.0–30.0)
Microalb, Ur: <0.7 mg/dL (ref 0.0–1.9)

## 2014-10-25 LAB — TSH: TSH: 1.1 u[IU]/mL (ref 0.35–4.50)

## 2014-10-25 LAB — LIPID PANEL
CHOLESTEROL: 223 mg/dL — AB (ref 0–200)
HDL: 60.1 mg/dL (ref >39.00)
LDL Cholesterol: 128 mg/dL — ABNORMAL HIGH (ref 0–99)
NONHDL: 162.9
Total CHOL/HDL Ratio: 4
Triglycerides: 175 mg/dL — ABNORMAL HIGH (ref 0.0–149.0)
VLDL: 35 mg/dL (ref 0.0–40.0)

## 2014-10-25 LAB — HEMOGLOBIN A1C: Hgb A1c MFr Bld: 5.5 % (ref 4.6–6.5)

## 2014-10-25 LAB — LDL CHOLESTEROL, DIRECT: Direct LDL: 140 mg/dL

## 2014-10-25 NOTE — Progress Notes (Signed)
Pre visit review using our clinic review tool, if applicable. No additional management support is needed unless otherwise documented below in the visit note. 

## 2014-10-25 NOTE — Patient Instructions (Addendum)
Increase your losartan dose to 100 mg daily in the morning for one week .  Return one week with BP cuff for an  RN visit to calibrate machine  Goal is < 130/80

## 2014-10-27 ENCOUNTER — Encounter: Payer: Self-pay | Admitting: Internal Medicine

## 2014-10-27 NOTE — Assessment & Plan Note (Signed)
Increasing losartan to 100 mg daily.  Return in one week with home BP cuff for recheck and calibration .  Lab Results  Component Value Date   CREATININE 0.69 10/25/2014   Lab Results  Component Value Date   NA 137 10/25/2014   K 4.2 10/25/2014   CL 102 10/25/2014   CO2 29 10/25/2014

## 2014-10-27 NOTE — Progress Notes (Signed)
Subjective:  Patient ID: Shelley Zuniga, female    DOB: 08-30-1950  Age: 64 y.o. MRN: [id number]  CC: The primary encounter diagnosis was Hyperlipidemia. Diagnoses of Essential hypertension, B12 deficiency, Neuropathy, Shingles outbreak, and Mixed hyperlipidemia were also pertinent to this visit.  HPI Shelley Zuniga presents for follow up on recent shingles episode, hypertension and hyperlipidemia.  Her rash and pain have resolved with early initation of acyclovir.   She has been checking her BP at home and noting elevated readings on the losartan 50 mg dose.   Outpatient Prescriptions Prior to Visit  Medication Sig Dispense Refill  . aspirin 81 MG tablet Take 81 mg by mouth daily.      . cholecalciferol (VITAMIN D) 1000 UNITS tablet Take 1,000 Units by mouth daily.      . Cyanocobalamin 1000 MCG SUBL Place 1 tablet (1,000 mcg total) under the tongue daily. 90 tablet 3  . Docusate Calcium (STOOL SOFTENER PO) Take by mouth.      . estradiol (ESTRACE) 1 MG tablet Take 1 mg by mouth daily.      . fluticasone (FLONASE) 50 MCG/ACT nasal spray Place 2 sprays into the nose daily.      Marland Kitchen losartan (COZAAR) 50 MG tablet take 1 tablet by mouth once daily 90 tablet 1  . PREVIDENT 5000 DRY MOUTH 1.1 % GEL dental gel     . vitamin E 400 UNIT capsule Take 400 Units by mouth daily.      Marland Kitchen zoster vaccine live, PF, (ZOSTAVAX) [id number] UNT/0.65ML injection Inject 19,400 Units into the skin once. 1 each 0  . acyclovir (ZOVIRAX) 200 MG capsule Take 1 capsule (200 mg total) by mouth 5 (five) times daily. (Patient not taking: Reported on 10/25/2014) 35 capsule 0   Facility-Administered Medications Prior to Visit  Medication Dose Route Frequency Provider Last Rate Last Dose  . methylPREDNISolone acetate (DEPO-MEDROL) injection 40 mg  40 mg Intramuscular Once Sherlene Shams, MD        Review of Systems;  Patient denies headache, fevers, malaise, unintentional weight loss, skin rash, eye pain, sinus congestion and  sinus pain, sore throat, dysphagia,  hemoptysis , cough, dyspnea, wheezing, chest pain, palpitations, orthopnea, edema, abdominal pain, nausea, melena, diarrhea, constipation, flank pain, dysuria, hematuria, urinary  Frequency, nocturia, numbness, tingling, seizures,  Focal weakness, Loss of consciousness,  Tremor, insomnia, depression, anxiety, and suicidal ideation.      Objective:  BP 142/84 mmHg  Pulse 67  Temp(Src) 98.1 F (36.7 C)  Resp 14  Ht  (1.626 m)  Wt 165 lb 1.9 oz (74.898 kg)  BMI 28.33 kg/m2  SpO2 97%  BP Readings from Last 3 Encounters:  10/25/14 142/84  10/01/14 168/90  02/28/14 146/78    Wt Readings from Last 3 Encounters:  10/25/14 165 lb 1.9 oz (74.898 kg)  10/01/14 167 lb 8 oz (75.978 kg)  02/28/14 166 lb (75.297 kg)    General appearance: alert, cooperative and appears stated age Ears: normal TM's and external ear canals both ears Throat: lips, mucosa, and tongue normal; teeth and gums normal Neck: no adenopathy, no carotid bruit, supple, symmetrical, trachea midline and thyroid not enlarged, symmetric, no tenderness/mass/nodules Back: symmetric, no curvature. ROM normal. No CVA tenderness. Lungs: clear to auscultation bilaterally Heart: regular rate and rhythm, S1, S2 normal, no murmur, click, rub or gallop Abdomen: soft, non-tender; bowel sounds normal; no masses,  no organomegaly Pulses: 2+ and symmetric Skin: Skin color, texture, turgor normal. No  rashes or lesions Lymph nodes: Cervical, supraclavicular, and axillary nodes normal.  Lab Results  Component Value Date   HGBA1C 5.5 10/25/2014    Lab Results  Component Value Date   CREATININE 0.69 10/25/2014   CREATININE 0.7 10/19/2013   CREATININE 0.8 10/03/2012    Lab Results  Component Value Date   WBC 4.9 10/19/2013   HGB 13.9 10/19/2013   HCT 40.8 10/19/2013   HCT 41.4 10/19/2013   PLT 262.0 10/19/2013   GLUCOSE 100* 10/25/2014   CHOL 223* 10/25/2014   TRIG 175.0* 10/25/2014     HDL 60.10 10/25/2014   LDLDIRECT 140.0 10/25/2014   LDLCALC 128* 10/25/2014   ALT 11 10/25/2014   AST 11 10/25/2014   NA 137 10/25/2014   K 4.2 10/25/2014   CL 102 10/25/2014   CREATININE 0.69 10/25/2014   BUN 12 10/25/2014   CO2 29 10/25/2014   TSH 1.10 10/25/2014   HGBA1C 5.5 10/25/2014   MICROALBUR <0.7 10/25/2014    Dg Knee Complete 4 Views Left  10/19/2013   CLINICAL DATA:  Larey Zuniga 1 month ago with pain  EXAM: LEFT KNEE - COMPLETE 4+ VIEW  COMPARISON:  None.  FINDINGS: The left knee joint spaces appear normal. No fracture is seen. No effusion is noted. Calcification is noted in the soft tissues of the left knee medially just below the level of the knee joint most likely vascular in origin.  IMPRESSION: Negative.   Electronically Signed   By: Dwyane Dee M.D.   On: 10/19/2013 13:07    Assessment & Plan:   Problem List Items Addressed This Visit    Hypertension    Increasing losartan to 100 mg daily.  Return in one week with home BP cuff for recheck and calibration .  Lab Results  Component Value Date   CREATININE 0.69 10/25/2014   Lab Results  Component Value Date   NA 137 10/25/2014   K 4.2 10/25/2014   CL 102 10/25/2014   CO2 29 10/25/2014         Relevant Orders   Comprehensive metabolic panel (Completed)   Microalbumin / creatinine urine ratio (Completed)   Mixed hyperlipidemia    Based on current lipid profile, the risk of clinically significant CAD is 14% over the next 10 years, using the Framingham risk calculator. The [employer] recommends starting patients aged 57 or higher on moderate intensity statin therapy for LDL between 70-189 and 10 yr risk of CAD > 7.5% .  Will advise patient to consider low dose atorvastatin  Lab Results  Component Value Date   CHOL 223* 10/25/2014   HDL 60.10 10/25/2014   LDLCALC 128* 10/25/2014   LDLDIRECT 140.0 10/25/2014   TRIG 175.0* 10/25/2014   CHOLHDL 4 10/25/2014          Shingles outbreak     Outbreak was mild.  Discussed use of shingles vaccine ,  Delay advised for 6 months        Other Visit Diagnoses    Hyperlipidemia    -  Primary    Relevant Orders    LDL cholesterol, direct (Completed)    Lipid panel (Completed)    B12 deficiency        Neuropathy        Relevant Orders    Hemoglobin A1c (Completed)    TSH (Completed)     A total of 25 minutes of face to face time was spent with patient more than half of which was  spent in counselling about the above mentioned conditions  and coordination of care   I am having Shelley Zuniga maintain her cholecalciferol, estradiol, vitamin E, aspirin, Docusate Calcium (STOOL SOFTENER PO), fluticasone, zoster vaccine live (PF), PREVIDENT 5000 DRY MOUTH, Cyanocobalamin, losartan, and acyclovir. We will continue to administer methylPREDNISolone acetate.  No orders of the defined types were placed in this encounter.    There are no discontinued medications.  Follow-up: Return in about 1 week (around 11/01/2014).   Sherlene Shams, MD

## 2014-10-27 NOTE — Assessment & Plan Note (Signed)
Outbreak was mild.  Discussed use of shingles vaccine ,  Delay advised for 6 months

## 2014-10-27 NOTE — Assessment & Plan Note (Signed)
Based on current lipid profile, the risk of clinically significant CAD is 14% over the next 10 years, using the Framingham risk calculator. The [employer] recommends starting patients aged 65 or higher on moderate intensity statin therapy for LDL between 70-189 and 10 yr risk of CAD > 7.5% .  Will advise patient to consider low dose atorvastatin  Lab Results  Component Value Date   CHOL 223* 10/25/2014   HDL 60.10 10/25/2014   LDLCALC 128* 10/25/2014   LDLDIRECT 140.0 10/25/2014   TRIG 175.0* 10/25/2014   CHOLHDL 4 10/25/2014

## 2014-10-28 ENCOUNTER — Encounter: Payer: Self-pay | Admitting: Internal Medicine

## 2014-10-28 DIAGNOSIS — E785 Hyperlipidemia, unspecified: Secondary | ICD-10-CM

## 2014-10-28 DIAGNOSIS — Z79899 Other long term (current) drug therapy: Secondary | ICD-10-CM

## 2014-10-29 MED ORDER — ATORVASTATIN CALCIUM 20 MG PO TABS: 20.0000 mg | ORAL_TABLET | Freq: Every day | ORAL | Status: DC

## 2014-10-29 NOTE — Telephone Encounter (Signed)
rx sent.  Needs lab visit 6 to 8 weeks for CMET lipids

## 2014-10-29 NOTE — Telephone Encounter (Signed)
See mychart, pt agreeable to atorvastatin. Mailed info on diets.

## 2014-11-01 ENCOUNTER — Ambulatory Visit (INDEPENDENT_AMBULATORY_CARE_PROVIDER_SITE_OTHER): Payer: BC Managed Care – PPO

## 2014-11-01 VITALS — BP 130/76 | HR 68 | Resp 16

## 2014-11-01 DIAGNOSIS — I10 Essential (primary) hypertension: Secondary | ICD-10-CM

## 2014-11-01 NOTE — Progress Notes (Addendum)
Patient came in for a BP check.  Initially on first check BP was 140/80.  Had patient sit in the room and relax and rechecked five minutes later.  Recheck was 130/76 with left arm.   Patient also wanted you to know that she got a message that her cholesterol was high and started Atorvastatin.            I have reviewed the above information and agree with above.   Duncan Dull, MD

## 2014-11-02 ENCOUNTER — Other Ambulatory Visit: Payer: Self-pay | Admitting: Internal Medicine

## 2014-11-09 ENCOUNTER — Encounter: Payer: Self-pay | Admitting: Internal Medicine

## 2014-11-09 MED ORDER — LOSARTAN POTASSIUM 100 MG PO TABS: 100.0000 mg | ORAL_TABLET | Freq: Every day | ORAL | Status: DC

## 2014-11-09 NOTE — Telephone Encounter (Signed)
Rx sent to pharmacy by escript, per Dr. Melina Schools office note 10/25/14 to increase losartan to 100 mg daily.

## 2014-12-28 ENCOUNTER — Other Ambulatory Visit (INDEPENDENT_AMBULATORY_CARE_PROVIDER_SITE_OTHER): Payer: BC Managed Care – PPO

## 2014-12-28 DIAGNOSIS — Z79899 Other long term (current) drug therapy: Secondary | ICD-10-CM

## 2014-12-28 DIAGNOSIS — E785 Hyperlipidemia, unspecified: Secondary | ICD-10-CM

## 2014-12-28 LAB — COMPREHENSIVE METABOLIC PANEL
ALT: 11 U/L (ref 0–35)
AST: 12 U/L (ref 0–37)
Albumin: 3.9 g/dL (ref 3.5–5.2)
Alkaline Phosphatase: 54 U/L (ref 39–117)
BUN: 16 mg/dL (ref 6–23)
CO2: 29 meq/L (ref 19–32)
Calcium: 9.3 mg/dL (ref 8.4–10.5)
Chloride: 102 mEq/L (ref 96–112)
Creatinine, Ser: 0.74 mg/dL (ref 0.40–1.20)
GFR: 83.74 mL/min (ref >60.00)
GLUCOSE: 113 mg/dL — AB (ref 70–99)
POTASSIUM: 4.3 meq/L (ref 3.5–5.1)
SODIUM: 137 meq/L (ref 135–145)
TOTAL PROTEIN: 7 g/dL (ref 6.0–8.3)
Total Bilirubin: 0.5 mg/dL (ref 0.2–1.2)

## 2014-12-28 LAB — LIPID PANEL
CHOLESTEROL: 157 mg/dL (ref 0–200)
HDL: 54.1 mg/dL (ref >39.00)
LDL Cholesterol: 81 mg/dL (ref 0–99)
NONHDL: 102.6
Total CHOL/HDL Ratio: 3
Triglycerides: 108 mg/dL (ref 0.0–149.0)
VLDL: 21.6 mg/dL (ref 0.0–40.0)

## 2014-12-29 ENCOUNTER — Encounter: Payer: Self-pay | Admitting: Internal Medicine

## 2015-02-25 ENCOUNTER — Other Ambulatory Visit: Payer: Self-pay | Admitting: Internal Medicine

## 2015-02-25 MED ORDER — LOSARTAN POTASSIUM 100 MG PO TABS
100.0000 mg | ORAL_TABLET | Freq: Every day | ORAL | Status: DC
Start: 2015-02-25 — End: 2015-08-19

## 2015-02-25 NOTE — Telephone Encounter (Signed)
Refill sent for Losartan  

## 2015-04-25 ENCOUNTER — Encounter: Payer: Self-pay | Admitting: Internal Medicine

## 2015-04-25 MED ORDER — CIPROFLOXACIN HCL 250 MG PO TABS: 250.0000 mg | ORAL_TABLET | Freq: Two times a day (BID) | ORAL | Status: DC

## 2015-08-19 ENCOUNTER — Other Ambulatory Visit: Payer: Self-pay

## 2015-08-19 MED ORDER — LOSARTAN POTASSIUM 100 MG PO TABS: 100.0000 mg | ORAL_TABLET | Freq: Every day | ORAL | Status: DC

## 2015-10-11 ENCOUNTER — Other Ambulatory Visit: Payer: Self-pay

## 2015-10-11 DIAGNOSIS — E785 Hyperlipidemia, unspecified: Secondary | ICD-10-CM

## 2015-10-11 MED ORDER — ATORVASTATIN CALCIUM 20 MG PO TABS: 20.0000 mg | ORAL_TABLET | Freq: Every day | ORAL | Status: DC

## 2015-10-25 ENCOUNTER — Other Ambulatory Visit: Payer: Self-pay | Admitting: *Deleted

## 2015-10-25 MED ORDER — CYANOCOBALAMIN ER 1000 MCG PO TBCR: EXTENDED_RELEASE_TABLET | ORAL | Status: DC

## 2015-11-13 ENCOUNTER — Encounter: Payer: Self-pay | Admitting: Internal Medicine

## 2015-11-13 ENCOUNTER — Ambulatory Visit (INDEPENDENT_AMBULATORY_CARE_PROVIDER_SITE_OTHER): Payer: Medicare Other | Admitting: Internal Medicine

## 2015-11-13 VITALS — BP 148/94 | HR 75 | Temp 98.2°F | Resp 12 | Ht 64.0 in | Wt 165.0 lb

## 2015-11-13 DIAGNOSIS — E559 Vitamin D deficiency, unspecified: Secondary | ICD-10-CM | POA: Diagnosis not present

## 2015-11-13 DIAGNOSIS — G629 Polyneuropathy, unspecified: Secondary | ICD-10-CM

## 2015-11-13 DIAGNOSIS — F409 Phobic anxiety disorder, unspecified: Secondary | ICD-10-CM

## 2015-11-13 DIAGNOSIS — E538 Deficiency of other specified B group vitamins: Secondary | ICD-10-CM | POA: Diagnosis not present

## 2015-11-13 DIAGNOSIS — R5383 Other fatigue: Secondary | ICD-10-CM

## 2015-11-13 DIAGNOSIS — F5105 Insomnia due to other mental disorder: Secondary | ICD-10-CM

## 2015-11-13 DIAGNOSIS — F339 Major depressive disorder, recurrent, unspecified: Secondary | ICD-10-CM | POA: Diagnosis not present

## 2015-11-13 DIAGNOSIS — E785 Hyperlipidemia, unspecified: Secondary | ICD-10-CM | POA: Diagnosis not present

## 2015-11-13 DIAGNOSIS — I10 Essential (primary) hypertension: Secondary | ICD-10-CM | POA: Diagnosis not present

## 2015-11-13 DIAGNOSIS — G63 Polyneuropathy in diseases classified elsewhere: Secondary | ICD-10-CM

## 2015-11-13 DIAGNOSIS — E2839 Other primary ovarian failure: Secondary | ICD-10-CM

## 2015-11-13 LAB — COMPREHENSIVE METABOLIC PANEL
ALK PHOS: 54 U/L (ref 39–117)
ALT: 23 U/L (ref 0–35)
AST: 19 U/L (ref 0–37)
Albumin: 4.1 g/dL (ref 3.5–5.2)
BILIRUBIN TOTAL: 0.7 mg/dL (ref 0.2–1.2)
BUN: 12 mg/dL (ref 6–23)
CO2: 30 meq/L (ref 19–32)
Calcium: 9.5 mg/dL (ref 8.4–10.5)
Chloride: 102 mEq/L (ref 96–112)
Creatinine, Ser: 0.64 mg/dL (ref 0.40–1.20)
GFR: 98.74 mL/min (ref >60.00)
GLUCOSE: 100 mg/dL — AB (ref 70–99)
Potassium: 4 mEq/L (ref 3.5–5.1)
SODIUM: 139 meq/L (ref 135–145)
TOTAL PROTEIN: 7.3 g/dL (ref 6.0–8.3)

## 2015-11-13 LAB — LIPID PANEL
CHOL/HDL RATIO: 3
CHOLESTEROL: 178 mg/dL (ref 0–200)
HDL: 64.3 mg/dL (ref >39.00)
LDL CALC: 87 mg/dL (ref 0–99)
NonHDL: 113.65
TRIGLYCERIDES: 133 mg/dL (ref 0.0–149.0)
VLDL: 26.6 mg/dL (ref 0.0–40.0)

## 2015-11-13 LAB — VITAMIN D 25 HYDROXY (VIT D DEFICIENCY, FRACTURES): VITD: 30.09 ng/mL (ref 30.00–100.00)

## 2015-11-13 LAB — LDL CHOLESTEROL, DIRECT: Direct LDL: 92 mg/dL

## 2015-11-13 LAB — TSH: TSH: 1.01 u[IU]/mL (ref 0.35–4.50)

## 2015-11-13 LAB — HEMOGLOBIN A1C: Hgb A1c MFr Bld: 5.7 % (ref 4.6–6.5)

## 2015-11-13 LAB — VITAMIN B12: Vitamin B-12: 1398 pg/mL — ABNORMAL HIGH (ref 211–911)

## 2015-11-13 MED ORDER — FLUTICASONE PROPIONATE 50 MCG/ACT NA SUSP: 2.0000 | Freq: Every day | NASAL | Status: DC

## 2015-11-13 MED ORDER — LOSARTAN POTASSIUM 100 MG PO TABS: 100.0000 mg | ORAL_TABLET | Freq: Every day | ORAL | Status: DC

## 2015-11-13 MED ORDER — BUPROPION HCL 75 MG PO TABS: 75.0000 mg | ORAL_TABLET | Freq: Two times a day (BID) | ORAL | Status: DC

## 2015-11-13 NOTE — Patient Instructions (Addendum)
We discussed resuming Wellbutrin starting at a dose of 75 mg twice daily.  We can increase the dose to 150 mg after 2 weeks if needed   We can add trazdone if needed for insomnia .  Call if no improvement on wellbutrin alone after 2 weeks    [facility] on [address] [place] and [facility] are both excellent dermatologists  [facility] as been ordered, [facility]  Return with BP machine for a RN visit soon.

## 2015-11-13 NOTE — Progress Notes (Signed)
Pre-visit discussion using our clinic review tool. No additional management support is needed unless otherwise documented below in the visit note.  

## 2015-11-13 NOTE — Progress Notes (Signed)
Subjective:  Patient ID: Shelley Zuniga, female    DOB: 07-24-1950  Age: 65 y.o. MRN: [id number]  CC: The primary encounter diagnosis was Estrogen deficiency. Diagnoses of Neuropathy (HCC), Essential hypertension, benign, Major depressive disorder, recurrent episode with melancholic features (HCC), Hyperlipidemia, Vitamin D deficiency, Insomnia due to anxiety and fear, Other fatigue, Essential hypertension, and B12 neuropathy were also pertinent to this visit.  HPI Shelley Zuniga presents for follow up on hypertension.  Last seen one year ago,, losartan was increased to 100 mg daily at last visit ,    mammogram  March 2017 no more PAP s, total hySTeterectomy  GYN and DEXA scan   [facility]  NORMAL COLONOSCOPY AT AGE 28   DUE AGAIN 69   FLONASE REFILLED   BP ELEVATED TODAY  SOME DIZZY SPELLS,   NEEDS RN VISIT FOR BP COMPARISON  NEEDS PREVNAR  TIRED,  NOT SLEEPING WELL, only getting 5-6 hours,  Nighttime snack cause early wakeups Using estrogen,  still flashing .  Lots of stress from family (hubby ,  Grandchild 11 months and 66 yr old mother)     FINGERNAILS BREAKING,  Bottom of feet feel warm for about an hour at bedtime   LAST USE Of wellbutrin in 2011 since 1996     Outpatient Prescriptions Prior to Visit  Medication Sig Dispense Refill  . aspirin 81 MG tablet Take 81 mg by mouth daily.      Marland Kitchen. atorvastatin (LIPITOR) 20 MG tablet Take 1 tablet (20 mg total) by mouth daily. 90 tablet 3  . cholecalciferol (VITAMIN D) 1000 UNITS tablet Take 1,000 Units by mouth daily.      . Cyanocobalamin 1000 MCG SUBL Place 1 tablet (1,000 mcg total) under the tongue daily. 90 tablet 3  . Docusate Calcium (STOOL SOFTENER PO) Take by mouth.      . estradiol (ESTRACE) 1 MG tablet Take 1 mg by mouth daily.      Marland Kitchen. PREVIDENT 5000 DRY MOUTH 1.1 % GEL dental gel     . vitamin E 400 UNIT capsule Take 400 Units by mouth daily.      . fluticasone (FLONASE) 50 MCG/ACT nasal spray Place 2 sprays into  the nose daily.      Marland Kitchen. losartan (COZAAR) 100 MG tablet Take 1 tablet (100 mg total) by mouth daily. 90 tablet 1  . acyclovir (ZOVIRAX) 200 MG capsule Take 1 capsule (200 mg total) by mouth 5 (five) times daily. (Patient not taking: Reported on 10/25/2014) 35 capsule 0  . ciprofloxacin (CIPRO) 250 MG tablet Take 1 tablet (250 mg total) by mouth 2 (two) times daily. 10 tablet 0  . Cyanocobalamin (B-12 TR) 1000 MCG TBCR PLACE 1 TABLET UNDER THE TONGUE DAILY 90 tablet 0  . zoster vaccine live, PF, (ZOSTAVAX) [id number] UNT/0.65ML injection Inject 19,400 Units into the skin once. 1 each 0   Facility-Administered Medications Prior to Visit  Medication Dose Route Frequency Provider Last Rate Last Dose  . methylPREDNISolone acetate (DEPO-MEDROL) injection 40 mg  40 mg Intramuscular Once Sherlene Shamseresa L Danisa Kopec, MD        Review of Systems;  Patient denies headache, fevers, , unintentional weight loss, skin rash, eye pain, sinus congestion and sinus pain, sore throat, dysphagia,  hemoptysis , cough, dyspnea, wheezing, chest pain, palpitations, orthopnea, edema, abdominal pain, nausea, melena, diarrhea, constipation, flank pain, dysuria, hematuria, urinary  Frequency, nocturia, numbness, tingling, seizures,  Focal weakness, Loss of consciousness,  Tremor,  depression, anxiety,  and suicidal ideation.      Objective:  BP 148/94 mmHg  Pulse 75  Temp(Src) 98.2 F (36.8 C) (Oral)  Resp 12  Ht 5\' 4"  (1.626 m)  Wt 165 lb (74.844 kg)  BMI 28.31 kg/m2  SpO2 95%  BP Readings from Last 3 Encounters:  11/13/15 148/94  11/01/14 130/76  10/25/14 142/84    Wt Readings from Last 3 Encounters:  11/13/15 165 lb (74.844 kg)  10/25/14 165 lb 1.9 oz (74.898 kg)  10/01/14 167 lb 8 oz (75.978 kg)    General appearance: alert, cooperative and appears stated age Ears: normal TM's and external ear canals both ears Throat: lips, mucosa, and tongue normal; teeth and gums normal Neck: no adenopathy, no carotid bruit, supple,  symmetrical, trachea midline and thyroid not enlarged, symmetric, no tenderness/mass/nodules Back: symmetric, no curvature. ROM normal. No CVA tenderness. Lungs: clear to auscultation bilaterally Heart: regular rate and rhythm, S1, S2 normal, no murmur, click, rub or gallop Abdomen: soft, non-tender; bowel sounds normal; no masses,  no organomegaly Pulses: 2+ and symmetric Skin: Skin color, texture, turgor normal. No rashes or lesions Lymph nodes: Cervical, supraclavicular, and axillary nodes normal.  Lab Results  Component Value Date   HGBA1C 5.7 11/13/2015   HGBA1C 5.5 10/25/2014    Lab Results  Component Value Date   CREATININE 0.64 11/13/2015   CREATININE 0.74 12/28/2014   CREATININE 0.69 10/25/2014    Lab Results  Component Value Date   WBC 4.9 10/19/2013   HGB 13.9 10/19/2013   HCT 40.8 10/19/2013   HCT 41.4 10/19/2013   PLT 262.0 10/19/2013   GLUCOSE 100* 11/13/2015   CHOL 178 11/13/2015   TRIG 133.0 11/13/2015   HDL 64.30 11/13/2015   LDLDIRECT 92.0 11/13/2015   LDLCALC 87 11/13/2015   ALT 23 11/13/2015   AST 19 11/13/2015   NA 139 11/13/2015   K 4.0 11/13/2015   CL 102 11/13/2015   CREATININE 0.64 11/13/2015   BUN 12 11/13/2015   CO2 30 11/13/2015   TSH 1.01 11/13/2015   HGBA1C 5.7 11/13/2015   MICROALBUR <0.7 10/25/2014    Dg Knee Complete 4 Views Left  10/19/2013  CLINICAL DATA:  Larey SeatFell 1 month ago with pain EXAM: LEFT KNEE - COMPLETE 4+ VIEW COMPARISON:  None. FINDINGS: The left knee joint spaces appear normal. No fracture is seen. No effusion is noted. Calcification is noted in the soft tissues of the left knee medially just below the level of the knee joint most likely vascular in origin. IMPRESSION: Negative. Electronically Signed   By: Dwyane DeePaul  Barry M.D.   On: 10/19/2013 13:07    Assessment & Plan:   Problem List Items Addressed This Visit    Hypertension    She has aandstory of hypertension but has had several elevated readings.  She has been asked  to check her pressures at home and submit readings for evaluation. Renal function is normal.  Lab Results  Component Value Date   CREATININE 0.64 11/13/2015   Lab Results  Component Value Date   NA 139 11/13/2015   K 4.0 11/13/2015   CL 102 11/13/2015   CO2 30 11/13/2015         Relevant Medications   losartan (COZAAR) 100 MG tablet   B12 neuropathy    Rechecking B12 level and screening for other causes. All other causes were ruled out    Lab Results  Component Value Date   ZOXWRUEA54VITAMINB12 1398* 11/13/2015   Lab Results  Component  Value Date   TSH 1.01 11/13/2015   Lab Results  Component Value Date   HGBA1C 5.7 11/13/2015         Insomnia due to anxiety and fear    Trazodone prescribed.       Fatigue    Thyroid function normalized.    Lab Results  Component Value Date   TSH 1.01 11/13/2015   Lab Results  Component Value Date   WBC 4.9 10/19/2013   HGB 13.9 10/19/2013   HCT 40.8 10/19/2013   HCT 41.4 10/19/2013   MCV 94.0 10/19/2013   PLT 262.0 10/19/2013        Major depressive disorder, recurrent episode with melancholic features (HCC)    Resuming wellbutrin after discussion of symptoms today and prior success with wellbutrin.       Relevant Medications   buPROPion (WELLBUTRIN) 75 MG tablet   Vitamin D deficiency   Relevant Orders   VITAMIN D 25 Hydroxy (Vit-D Deficiency, Fractures) (Completed)    Other Visit Diagnoses    Estrogen deficiency    -  Primary    Relevant Orders    DG Bone Density    Neuropathy (HCC)        Relevant Orders    Hemoglobin A1c (Completed)    Vitamin B12 (Completed)    RPR (Completed)    TSH (Completed)    Essential hypertension, benign        Relevant Medications    losartan (COZAAR) 100 MG tablet    Other Relevant Orders    Comprehensive metabolic panel (Completed)    Hyperlipidemia        Relevant Medications    losartan (COZAAR) 100 MG tablet    Other Relevant Orders    LDL cholesterol, direct (Completed)     Lipid panel (Completed)       I have discontinued Ms. Francisco's zoster vaccine live (PF), acyclovir, and ciprofloxacin. I am also having her start on buPROPion. Additionally, I am having her maintain her cholecalciferol, estradiol, vitamin E, aspirin, Docusate Calcium (STOOL SOFTENER PO), PREVIDENT 5000 DRY MOUTH, Cyanocobalamin, atorvastatin, losartan, and fluticasone. We will continue to administer methylPREDNISolone acetate.  Meds ordered this encounter  Medications  . DISCONTD: fluticasone (FLONASE) 50 MCG/ACT nasal spray    Sig: Place 2 sprays into both nostrils daily.    Dispense:  9.9 g    Refill:  2  . losartan (COZAAR) 100 MG tablet    Sig: Take 1 tablet (100 mg total) by mouth daily.    Dispense:  90 tablet    Refill:  1  . fluticasone (FLONASE) 50 MCG/ACT nasal spray    Sig: Place 2 sprays into both nostrils daily.    Dispense:  16 g    Refill:  2    90 DAY SUPPLY  . buPROPion (WELLBUTRIN) 75 MG tablet    Sig: Take 1 tablet (75 mg total) by mouth 2 (two) times daily.    Dispense:  180 tablet    Refill:  1    Medications Discontinued During This Encounter  Medication Reason  . ciprofloxacin (CIPRO) 250 MG tablet Patient Preference  . acyclovir (ZOVIRAX) 200 MG capsule Completed Course  . Cyanocobalamin (B-12 TR) 1000 MCG TBCR Error  . zoster vaccine live, PF, (ZOSTAVAX) 16109 UNT/0.65ML injection Completed Course  . fluticasone (FLONASE) 50 MCG/ACT nasal spray Reorder  . losartan (COZAAR) 100 MG tablet Reorder  . fluticasone (FLONASE) 50 MCG/ACT nasal spray Reorder    Follow-up: Return in about 6  months (around 05/14/2016).   Sherlene Shams, MD

## 2015-11-14 LAB — RPR

## 2015-11-15 DIAGNOSIS — F5105 Insomnia due to other mental disorder: Secondary | ICD-10-CM

## 2015-11-15 DIAGNOSIS — R5383 Other fatigue: Secondary | ICD-10-CM | POA: Insufficient documentation

## 2015-11-15 DIAGNOSIS — F409 Phobic anxiety disorder, unspecified: Secondary | ICD-10-CM | POA: Insufficient documentation

## 2015-11-15 DIAGNOSIS — F339 Major depressive disorder, recurrent, unspecified: Secondary | ICD-10-CM | POA: Insufficient documentation

## 2015-11-15 DIAGNOSIS — F411 Generalized anxiety disorder: Secondary | ICD-10-CM | POA: Insufficient documentation

## 2015-11-15 NOTE — Assessment & Plan Note (Signed)
Resuming wellbutrin after discussion of symptoms today and prior success with wellbutrin.

## 2015-11-15 NOTE — Assessment & Plan Note (Signed)
Resuming effexor,

## 2015-11-15 NOTE — Assessment & Plan Note (Signed)
She has aandstory of hypertension but has had several elevated readings.  She has been asked to check her pressures at home and submit readings for evaluation. Renal function is normal.  Lab Results  Component Value Date   CREATININE 0.64 11/13/2015   Lab Results  Component Value Date   NA 139 11/13/2015   K 4.0 11/13/2015   CL 102 11/13/2015   CO2 30 11/13/2015

## 2015-11-15 NOTE — Assessment & Plan Note (Signed)
Rechecking B12 level and screening for other causes. All other causes were ruled out    Lab Results  Component Value Date   VITAMINB12 1398* 11/13/2015   Lab Results  Component Value Date   TSH 1.01 11/13/2015   Lab Results  Component Value Date   HGBA1C 5.7 11/13/2015

## 2015-11-15 NOTE — Assessment & Plan Note (Signed)
Trazodone prescribed.

## 2015-11-15 NOTE — Assessment & Plan Note (Signed)
Thyroid function normalized.    Lab Results  Component Value Date   TSH 1.01 11/13/2015   Lab Results  Component Value Date   WBC 4.9 10/19/2013   HGB 13.9 10/19/2013   HCT 40.8 10/19/2013   HCT 41.4 10/19/2013   MCV 94.0 10/19/2013   PLT 262.0 10/19/2013

## 2015-11-17 ENCOUNTER — Encounter: Payer: Self-pay | Admitting: Internal Medicine

## 2015-12-12 ENCOUNTER — Ambulatory Visit: Payer: Medicare Other

## 2015-12-12 VITALS — BP 148/78 | HR 75 | Resp 18

## 2015-12-12 DIAGNOSIS — I10 Essential (primary) hypertension: Secondary | ICD-10-CM

## 2015-12-12 NOTE — Progress Notes (Signed)
Patient came in for a BP check.  Did comparsion with home cuff, vitals included in vital section.  Patient has been checking BP at home, noticed yesterday she was bending arm with checking, advised proper positioning with home cuff.  Witnessed check in the office.  Gave readings from this am 134/84 right arm and 137/77 in Left arm around 615am.  Patient states that every time she comes in here it is up.  She has been under some stress this past week, has a tooth abcess and needed a root canal so has been struggling with that.   Please advise, can mychart with any changes.

## 2015-12-13 ENCOUNTER — Ambulatory Visit (INDEPENDENT_AMBULATORY_CARE_PROVIDER_SITE_OTHER): Payer: Medicare Other | Admitting: Family Medicine

## 2015-12-13 ENCOUNTER — Encounter: Payer: Self-pay | Admitting: Family Medicine

## 2015-12-13 VITALS — BP 164/86 | HR 102 | Temp 98.3°F | Wt 164.8 lb

## 2015-12-13 DIAGNOSIS — N390 Urinary tract infection, site not specified: Secondary | ICD-10-CM | POA: Diagnosis not present

## 2015-12-13 LAB — POCT URINALYSIS DIPSTICK
Glucose, UA: 100
Nitrite, UA: POSITIVE
PH UA: 6
PROTEIN UA: 100
Spec Grav, UA: 1.015
Urobilinogen, UA: 4

## 2015-12-13 MED ORDER — CIPROFLOXACIN HCL 500 MG PO TABS: 500.0000 mg | ORAL_TABLET | Freq: Two times a day (BID) | ORAL | 0 refills | Status: DC

## 2015-12-13 NOTE — Patient Instructions (Signed)
Take the antibiotic as prescribed.  Follow up as needed.  Take care  Dr. Reegan Bouffard  

## 2015-12-13 NOTE — Assessment & Plan Note (Signed)
Acute uti. History of previous UTI. + Urinalysis. Treating with Cipro while awaiting culture.

## 2015-12-13 NOTE — Progress Notes (Signed)
   Subjective:  Patient ID: Shelley Zuniga, female    DOB: 1951-02-08  Age: 65 y.o. MRN: [id number]  CC: Pain with urination  HPI:  65 year old female presents with the above complaints.  Patient reports that she developed dysuria last night. She reports associated pain in the suprapubic region. No fever. No flank pain. She's been taking Azo with significant improvement. No known exacerbating or relieving factors. No other complaints this time. Of note, patient has had frequent UTIs over the past few years.  Social Hx   Social History   Social History  . Marital status: Married    Spouse name: N/A  . Number of children: N/A  . Years of education: N/A   Social History Main Topics  . Smoking status: Never Smoker  . Smokeless tobacco: Never Used  . Alcohol use No  . Drug use: No  . Sexual activity: Yes    Birth control/ protection: Post-menopausal   Other Topics Concern  . None   Social History Narrative  . None   Review of Systems  Constitutional: Negative.   Gastrointestinal: Positive for abdominal pain.  Genitourinary: Positive for dysuria.   Objective:  BP (!) 164/86 (BP Location: Left Arm, Patient Position: Sitting, Cuff Size: Normal)   Pulse (!) 102   Temp 98.3 F (36.8 C) (Oral)   Wt 164 lb 12 oz (74.7 kg)   SpO2 94%   BMI 28.28 kg/m   BP/Weight 12/13/2015 12/12/2015 11/13/2015  Systolic BP 164 148 148  Diastolic BP 86 78 94  Wt. (Lbs) 164.75 - 165  BMI 28.28 - 28.31   Physical Exam  Constitutional: She is oriented to person, place, and time. She appears well-developed. No distress.  Cardiovascular: Normal rate and regular rhythm.   No murmur heard. Pulmonary/Chest: Effort normal and breath sounds normal.  Abdominal:  Soft, nondistended. Mild suprapubic tenderness.  Neurological: She is alert and oriented to person, place, and time.  Psychiatric: She has a normal mood and affect.  Vitals reviewed.  Lab Results  Component Value Date   WBC 4.9  10/19/2013   HGB 13.9 10/19/2013   HCT 40.8 10/19/2013   HCT 41.4 10/19/2013   PLT 262.0 10/19/2013   GLUCOSE 100 (H) 11/13/2015   CHOL 178 11/13/2015   TRIG 133.0 11/13/2015   HDL 64.30 11/13/2015   LDLDIRECT 92.0 11/13/2015   LDLCALC 87 11/13/2015   ALT 23 11/13/2015   AST 19 11/13/2015   NA 139 11/13/2015   K 4.0 11/13/2015   CL 102 11/13/2015   CREATININE 0.64 11/13/2015   BUN 12 11/13/2015   CO2 30 11/13/2015   TSH 1.01 11/13/2015   HGBA1C 5.7 11/13/2015   MICROALBUR <0.7 10/25/2014    Assessment & Plan:   Problem List Items Addressed This Visit    UTI (lower urinary tract infection) - Primary    Acute uti. History of previous UTI. + Urinalysis. Treating with Cipro while awaiting culture.       Relevant Orders   POCT Urinalysis Dipstick (Completed)   Urine culture    Other Visit Diagnoses   None.    Meds ordered this encounter  Medications  . ciprofloxacin (CIPRO) 500 MG tablet    Sig: Take 1 tablet (500 mg total) by mouth 2 (two) times daily.    Dispense:  10 tablet    Refill:  0    Follow-up: PRN  Everlene Other DO [facility]

## 2015-12-13 NOTE — Progress Notes (Signed)
Pre visit review using our clinic review tool, if applicable. No additional management support is needed unless otherwise documented below in the visit note. 

## 2015-12-15 LAB — URINE CULTURE

## 2015-12-15 NOTE — Progress Notes (Signed)
  I have reviewed the above information and agree with above.  No changes to medications are needed at this time . Duncan Dull, MD

## 2016-02-05 ENCOUNTER — Other Ambulatory Visit: Payer: Self-pay | Admitting: Internal Medicine

## 2016-05-02 ENCOUNTER — Other Ambulatory Visit: Payer: Self-pay | Admitting: Internal Medicine

## 2016-05-13 ENCOUNTER — Other Ambulatory Visit: Payer: Self-pay | Admitting: Internal Medicine

## 2016-05-15 ENCOUNTER — Ambulatory Visit (INDEPENDENT_AMBULATORY_CARE_PROVIDER_SITE_OTHER): Payer: Medicare Other | Admitting: Internal Medicine

## 2016-05-15 ENCOUNTER — Encounter: Payer: Self-pay | Admitting: Internal Medicine

## 2016-05-15 VITALS — BP 164/90 | HR 76 | Temp 98.1°F | Ht 64.0 in | Wt 162.0 lb

## 2016-05-15 DIAGNOSIS — E782 Mixed hyperlipidemia: Secondary | ICD-10-CM | POA: Diagnosis not present

## 2016-05-15 DIAGNOSIS — Z23 Encounter for immunization: Secondary | ICD-10-CM | POA: Diagnosis not present

## 2016-05-15 DIAGNOSIS — R7301 Impaired fasting glucose: Secondary | ICD-10-CM | POA: Diagnosis not present

## 2016-05-15 DIAGNOSIS — E559 Vitamin D deficiency, unspecified: Secondary | ICD-10-CM | POA: Diagnosis not present

## 2016-05-15 DIAGNOSIS — I10 Essential (primary) hypertension: Secondary | ICD-10-CM | POA: Diagnosis not present

## 2016-05-15 DIAGNOSIS — G63 Polyneuropathy in diseases classified elsewhere: Secondary | ICD-10-CM

## 2016-05-15 DIAGNOSIS — F339 Major depressive disorder, recurrent, unspecified: Secondary | ICD-10-CM

## 2016-05-15 DIAGNOSIS — F409 Phobic anxiety disorder, unspecified: Secondary | ICD-10-CM

## 2016-05-15 DIAGNOSIS — E538 Deficiency of other specified B group vitamins: Secondary | ICD-10-CM

## 2016-05-15 DIAGNOSIS — F5105 Insomnia due to other mental disorder: Secondary | ICD-10-CM

## 2016-05-15 LAB — COMPREHENSIVE METABOLIC PANEL
ALT: 16 U/L (ref 0–35)
AST: 13 U/L (ref 0–37)
Albumin: 4.1 g/dL (ref 3.5–5.2)
Alkaline Phosphatase: 59 U/L (ref 39–117)
BILIRUBIN TOTAL: 0.6 mg/dL (ref 0.2–1.2)
BUN: 13 mg/dL (ref 6–23)
CHLORIDE: 101 meq/L (ref 96–112)
CO2: 30 meq/L (ref 19–32)
CREATININE: 0.81 mg/dL (ref 0.40–1.20)
Calcium: 9.3 mg/dL (ref 8.4–10.5)
GFR: 75.12 mL/min (ref >60.00)
GLUCOSE: 107 mg/dL — AB (ref 70–99)
Potassium: 4.2 mEq/L (ref 3.5–5.1)
Sodium: 138 mEq/L (ref 135–145)
Total Protein: 6.6 g/dL (ref 6.0–8.3)

## 2016-05-15 LAB — LDL CHOLESTEROL, DIRECT: LDL DIRECT: 72 mg/dL

## 2016-05-15 LAB — LIPID PANEL
CHOLESTEROL: 160 mg/dL (ref 0–200)
HDL: 63.3 mg/dL (ref >39.00)
LDL Cholesterol: 68 mg/dL (ref 0–99)
NonHDL: 96.94
TRIGLYCERIDES: 147 mg/dL (ref 0.0–149.0)
Total CHOL/HDL Ratio: 3
VLDL: 29.4 mg/dL (ref 0.0–40.0)

## 2016-05-15 LAB — MICROALBUMIN / CREATININE URINE RATIO
CREATININE, U: 110.3 mg/dL
MICROALB/CREAT RATIO: 0.6 mg/g (ref 0.0–30.0)

## 2016-05-15 LAB — VITAMIN D 25 HYDROXY (VIT D DEFICIENCY, FRACTURES): VITD: 25.35 ng/mL — ABNORMAL LOW (ref 30.00–100.00)

## 2016-05-15 LAB — HEMOGLOBIN A1C: HEMOGLOBIN A1C: 5.7 % (ref 4.6–6.5)

## 2016-05-15 MED ORDER — BUPROPION HCL ER (SR) 100 MG PO TB12: 100.0000 mg | ORAL_TABLET | Freq: Two times a day (BID) | ORAL | 2 refills | Status: DC

## 2016-05-15 MED ORDER — TRAZODONE HCL 50 MG PO TABS: 25.0000 mg | ORAL_TABLET | Freq: Every evening | ORAL | 1 refills | Status: DC | PRN

## 2016-05-15 NOTE — Patient Instructions (Addendum)
CHECK bp 3 TIMES OVER NEXT MONTH AND SEND ME READINGS  OK TO INCREASE COLACE TO 200 MG AT BEDTIME   OK TO ADD MIRALAX IF NEEDED  ADDING TRAZODONE AT BEDTIME FOR INSOMNIA  START WITH 1/2 TABLET  ,  MAY INCREASE GRADUALLY UP TO 100 MG IF TOLERATED  CHANGED WELLBUTRIN TO LONGER ACTING,  100 MG DOSE,  STILL TWICE DAILY BUT LESS DISRUPTIVE IF YOU MISS THE SECOND DOSE

## 2016-05-15 NOTE — Progress Notes (Signed)
Pre visit review using our clinic review tool, if applicable. No additional management support is needed unless otherwise documented below in the visit note. 

## 2016-05-15 NOTE — Progress Notes (Signed)
Subjective:  Patient ID: Shelley Zuniga, female    DOB: [birth year]  Age: 65 y.o. MRN: [id number]  CC: The primary encounter diagnosis was Essential hypertension. Diagnoses of Mixed hyperlipidemia, Vitamin D deficiency, Impaired fasting glucose, Insomnia due to anxiety and fear, Major depressive disorder, recurrent episode with melancholic features (HCC), and B12 neuropathy (HCC) were also pertinent to this visit.  HPI Shelley Zuniga presents for 6 month follow up on major depressive disorder,  Hyperlipidemia,  And hypertension  Depression: her symptoms have  improved with wellbutrin and she has not had any difficulty falling asleep.  However, despite being asleep by 11, she wakes up by 3 am at least 5 times per week and is unable to return to sleep.   Frequent constipation.  Uses a stool softener at bedtime,  Still moves bowels every 2-3 days.  No blood in stools.   Hypertension: Patient is taking her medications as prescribed and notes no adverse effects.  Home BP readings have been done about once per week and are  generally < 130/80 , but have not been checked in several months.   She is avoiding added salt in her diet and walking regularly about 3 times per week for exercise  .    Outpatient Medications Prior to Visit  Medication Sig Dispense Refill  . aspirin 81 MG tablet Take 81 mg by mouth daily.      Marland Kitchen atorvastatin (LIPITOR) 20 MG tablet Take 1 tablet (20 mg total) by mouth daily. 90 tablet 3  . B-12 TR 1000 MCG TBCR place 1 tablet UNDER THE TONGUE daily 90 tablet 1  . buPROPion (WELLBUTRIN) 75 MG tablet take 1 tablet by mouth twice a day 180 tablet 1  . cholecalciferol (VITAMIN D) 1000 UNITS tablet Take 1,000 Units by mouth daily.      . Cyanocobalamin 1000 MCG SUBL Place 1 tablet (1,000 mcg total) under the tongue daily. 90 tablet 3  . Docusate Calcium (STOOL SOFTENER PO) Take by mouth.      . estradiol (ESTRACE) 1 MG tablet Take 1 mg by mouth daily.      . fluticasone (FLONASE)  50 MCG/ACT nasal spray instill 2 sprays into each nostril once daily 16 g 2  . losartan (COZAAR) 100 MG tablet Take 1 tablet (100 mg total) by mouth daily. 90 tablet 1  . PREVIDENT 5000 DRY MOUTH 1.1 % GEL dental gel     . vitamin E 400 UNIT capsule Take 400 Units by mouth daily.      . ciprofloxacin (CIPRO) 500 MG tablet Take 1 tablet (500 mg total) by mouth 2 (two) times daily. 10 tablet 0   Facility-Administered Medications Prior to Visit  Medication Dose Route Frequency Provider Last Rate Last Dose  . methylPREDNISolone acetate (DEPO-MEDROL) injection 40 mg  40 mg Intramuscular Once Sherlene Shams, MD        Review of Systems;  Patient denies headache, fevers, malaise, unintentional weight loss, skin rash, eye pain, sinus congestion and sinus pain, sore throat, dysphagia,  hemoptysis , cough, dyspnea, wheezing, chest pain, palpitations, orthopnea, edema, abdominal pain, nausea, melena, diarrhea,  flank pain, dysuria, hematuria, urinary  Frequency, nocturia, numbness, tingling, seizures,  Focal weakness, Loss of consciousness,  Tremor,depression, , and suicidal ideation.      Objective:  BP (!) 164/90   Pulse 76   Temp 98.1 F (36.7 C) (Oral)   Ht 5\' 4"  (1.626 m)   Wt 162 lb (73.5 kg)  SpO2 94%   BMI 27.81 kg/m   BP Readings from Last 3 Encounters:  05/15/16 (!) 164/90  12/13/15 (!) 164/86  12/12/15 (!) 148/78    Wt Readings from Last 3 Encounters:  05/15/16 162 lb (73.5 kg)  12/13/15 164 lb 12 oz (74.7 kg)  11/13/15 165 lb (74.8 kg)    General appearance: alert, cooperative and appears stated age Ears: normal TM's and external ear canals both ears Throat: lips, mucosa, and tongue normal; teeth and gums normal Neck: no adenopathy, no carotid bruit, supple, symmetrical, trachea midline and thyroid not enlarged, symmetric, no tenderness/mass/nodules Back: symmetric, no curvature. ROM normal. No CVA tenderness. Lungs: clear to auscultation bilaterally Heart: regular  rate and rhythm, S1, S2 normal, no murmur, click, rub or gallop Abdomen: soft, non-tender; bowel sounds normal; no masses,  no organomegaly Pulses: 2+ and symmetric Skin: Skin color, texture, turgor normal. No rashes or lesions Lymph nodes: Cervical, supraclavicular, and axillary nodes normal.  Lab Results  Component Value Date   HGBA1C 5.7 05/15/2016   HGBA1C 5.7 11/13/2015   HGBA1C 5.5 10/25/2014    Lab Results  Component Value Date   CREATININE 0.81 05/15/2016   CREATININE 0.64 11/13/2015   CREATININE 0.74 12/28/2014    Lab Results  Component Value Date   WBC 4.9 10/19/2013   HGB 13.9 10/19/2013   HCT 40.8 10/19/2013   HCT 41.4 10/19/2013   PLT 262.0 10/19/2013   GLUCOSE 107 (H) 05/15/2016   CHOL 160 05/15/2016   TRIG 147.0 05/15/2016   HDL 63.30 05/15/2016   LDLDIRECT 72.0 05/15/2016   LDLCALC 68 05/15/2016   ALT 16 05/15/2016   AST 13 05/15/2016   NA 138 05/15/2016   K 4.2 05/15/2016   CL 101 05/15/2016   CREATININE 0.81 05/15/2016   BUN 13 05/15/2016   CO2 30 05/15/2016   TSH 1.01 11/13/2015   HGBA1C 5.7 05/15/2016   MICROALBUR <0.7 05/15/2016    Dg Knee Complete 4 Views Left  Result Date: 10/19/2013 CLINICAL DATA:  Larey SeatFell 1 month ago with pain EXAM: LEFT KNEE - COMPLETE 4+ VIEW COMPARISON:  None. FINDINGS: The left knee joint spaces appear normal. No fracture is seen. No effusion is noted. Calcification is noted in the soft tissues of the left knee medially just below the level of the knee joint most likely vascular in origin. IMPRESSION: Negative. Electronically Signed   By: Dwyane DeePaul  Barry M.D.   On: 10/19/2013 13:07    Assessment & Plan:   Problem List Items Addressed This Visit    B12 neuropathy (HCC)    Managed with sublingual vitamin supplement  Lab Results  Component Value Date   VITAMINB12 1,398 (H) 11/13/2015         Hypertension - Primary    Elevated today.  Reviewed list of meds, patient is not taking OTC meds that could be causing,. It.   Have asked patient to recheck bp at home a minimum of 5 times over the next 4 weeks and call readings to office for adjustment of medications.        Relevant Orders   Microalbumin / creatinine urine ratio (Completed)   Comprehensive metabolic panel (Completed)   Impaired fasting glucose    Discussed her recent elevated fasting glucoses,  Screened her for diabetes and the relevance of an a1c that suggests prediabetes.  Lab Results  Component Value Date   HGBA1C 5.7 05/15/2016         Relevant Orders   Hemoglobin A1c (Completed)  Insomnia due to anxiety and fear    Adding trazodone at bedtime. Patient instructed to start with 25 mg and increase gradually to 100 mg if needed       Major depressive disorder, recurrent episode with melancholic features (HCC)    Improved with wellbutrin 75 mg bid,  Changing to wellbutrin Sr 100 mg bid,       Relevant Medications   buPROPion (WELLBUTRIN SR) 100 MG 12 hr tablet   traZODone (DESYREL) 50 MG tablet   Mixed hyperlipidemia    LDL and triglycerides are at goal on current medications. shehas no side effects and liver enzymes are normal. No changes today.  Lab Results  Component Value Date   CHOL 160 05/15/2016   HDL 63.30 05/15/2016   LDLCALC 68 05/15/2016   LDLDIRECT 72.0 05/15/2016   TRIG 147.0 05/15/2016   CHOLHDL 3 05/15/2016   Lab Results  Component Value Date   ALT 16 05/15/2016   AST 13 05/15/2016   ALKPHOS 59 05/15/2016   BILITOT 0.6 05/15/2016          Relevant Orders   LDL cholesterol, direct (Completed)   Lipid panel (Completed)   Vitamin D deficiency   Relevant Orders   VITAMIN D 25 Hydroxy (Vit-D Deficiency, Fractures) (Completed)      I have discontinued Ms. Horne's ciprofloxacin. I am also having her start on buPROPion and traZODone. Additionally, I am having her maintain her cholecalciferol, estradiol, vitamin E, aspirin, Docusate Calcium (STOOL SOFTENER PO), PREVIDENT 5000 DRY MOUTH, Cyanocobalamin,  atorvastatin, losartan, B-12 TR, fluticasone, and buPROPion. We will continue to administer methylPREDNISolone acetate.  Meds ordered this encounter  Medications  . buPROPion (WELLBUTRIN SR) 100 MG 12 hr tablet    Sig: Take 1 tablet (100 mg total) by mouth 2 (two) times daily.    Dispense:  60 tablet    Refill:  2  . traZODone (DESYREL) 50 MG tablet    Sig: Take 0.5-1 tablets (25-50 mg total) by mouth at bedtime as needed for sleep.    Dispense:  90 tablet    Refill:  1    Medications Discontinued During This Encounter  Medication Reason  . ciprofloxacin (CIPRO) 500 MG tablet Patient has not taken in last 30 days    Follow-up: Return in about 6 months (around 11/13/2016), or ANNUAL EXAM,  FASTING LABS PRIOR .   Sherlene ShamsULLO, Eshani Maestre L, MD

## 2016-05-17 NOTE — Assessment & Plan Note (Signed)
LDL and triglycerides are at goal on current medications. shehas no side effects and liver enzymes are normal. No changes today.  Lab Results  Component Value Date   CHOL 160 05/15/2016   HDL 63.30 05/15/2016   LDLCALC 68 05/15/2016   LDLDIRECT 72.0 05/15/2016   TRIG 147.0 05/15/2016   CHOLHDL 3 05/15/2016   Lab Results  Component Value Date   ALT 16 05/15/2016   AST 13 05/15/2016   ALKPHOS 59 05/15/2016   BILITOT 0.6 05/15/2016

## 2016-05-17 NOTE — Assessment & Plan Note (Signed)
Discussed her recent elevated fasting glucoses,  Screened her for diabetes and the relevance of an a1c that suggests prediabetes.  Lab Results  Component Value Date   HGBA1C 5.7 05/15/2016

## 2016-05-17 NOTE — Assessment & Plan Note (Signed)
Improved with wellbutrin 75 mg bid,  Changing to wellbutrin Sr 100 mg bid,

## 2016-05-17 NOTE — Assessment & Plan Note (Signed)
Elevated today.  Reviewed list of meds, patient is not taking OTC meds that could be causing,. It.  Have asked patient to recheck bp at home a minimum of 5 times over the next 4 weeks and call readings to office for adjustment of medications.   

## 2016-05-17 NOTE — Assessment & Plan Note (Addendum)
Adding trazodone at bedtime. Patient instructed to start with 25 mg and increase gradually to 100 mg if needed

## 2016-05-17 NOTE — Assessment & Plan Note (Signed)
Managed with sublingual vitamin supplement  Lab Results  Component Value Date   VITAMINB12 1,398 (H) 11/13/2015

## 2016-05-18 ENCOUNTER — Encounter: Payer: Self-pay | Admitting: Internal Medicine

## 2016-05-19 NOTE — Telephone Encounter (Signed)
FYI updated chart.

## 2016-06-15 LAB — HM DEXA SCAN: HM DEXA SCAN: NORMAL

## 2016-06-22 ENCOUNTER — Encounter: Payer: Self-pay | Admitting: Internal Medicine

## 2016-06-24 MED ORDER — AMLODIPINE BESYLATE 5 MG PO TABS: 5.0000 mg | ORAL_TABLET | Freq: Every day | ORAL | 3 refills | Status: DC

## 2016-06-25 ENCOUNTER — Ambulatory Visit (INDEPENDENT_AMBULATORY_CARE_PROVIDER_SITE_OTHER): Payer: Medicare Other | Admitting: Internal Medicine

## 2016-06-25 ENCOUNTER — Encounter: Payer: Self-pay | Admitting: Internal Medicine

## 2016-06-25 DIAGNOSIS — N3000 Acute cystitis without hematuria: Secondary | ICD-10-CM

## 2016-06-25 DIAGNOSIS — I10 Essential (primary) hypertension: Secondary | ICD-10-CM | POA: Diagnosis not present

## 2016-06-25 DIAGNOSIS — F339 Major depressive disorder, recurrent, unspecified: Secondary | ICD-10-CM | POA: Diagnosis not present

## 2016-06-25 DIAGNOSIS — R300 Dysuria: Secondary | ICD-10-CM | POA: Diagnosis not present

## 2016-06-25 LAB — POCT URINALYSIS DIPSTICK
Glucose, UA: NEGATIVE
Ketones, UA: NEGATIVE
Nitrite, UA: POSITIVE
Protein, UA: NEGATIVE
Spec Grav, UA: 1.02
Urobilinogen, UA: 2
pH, UA: 6

## 2016-06-25 LAB — URINALYSIS, MICROSCOPIC ONLY

## 2016-06-25 MED ORDER — CIPROFLOXACIN HCL 250 MG PO TABS: 250.0000 mg | ORAL_TABLET | Freq: Two times a day (BID) | ORAL | 0 refills | Status: DC

## 2016-06-25 NOTE — Patient Instructions (Signed)
You  do appear to have another UTI by the dipstick we ran today   I am Starting you on ciprofloxacin to  take for 3 to 5 days stop after 3 if symptoms are gone  consider adding a daily cranberry tablet supplement  To prevent UTi's   Taking an antibiotic can create an imbalance in the normal population of bacteria that live in the small intestine.  This imbalance can persist for 3 months.   Taking a probiotic ( Align, Floraque or Culturelle), the generic version of one of these over the counter medications, or an alternative form (kombucha,  Yogurt, or another dietary source) for a minimum of 3 weeks may help prevent a serious antibiotic associated diarrhea  Called clostridium dificile colitis that occurs when the bacteria population is altered .  Taking a probiotic may also prevent vaginitis due to yeast infections and can be continued indefinitely if you feel that it improves your digestion or your elimination (bowels).

## 2016-06-25 NOTE — Progress Notes (Signed)
Pre visit review using our clinic review tool, if applicable. No additional management support is needed unless otherwise documented below in the visit note. 

## 2016-06-25 NOTE — Progress Notes (Signed)
Subjective:  Patient ID: Shelley Zuniga, female    DOB: June 18, 1950  Age: 66 y.o. MRN: [id number]  CC: Diagnoses of Dysuria, Essential hypertension, Acute cystitis without hematuria, and Major depressive disorder, recurrent episode with melancholic features (HCC) were pertinent to this visit.  HPI Shelley Zuniga presents for evaluation of dysuria and follow up on hypertension  1) dysuria: symptoms started 24 hours ago with burninn during urination, frequency, and suprapubic pressure.  She denies any recent sexual activity swimming or hot tub use, and no recent travel.   She denies nausea, back pain and recent us of antibiotics.  2): Hypertension: patient checking blood pressure daily for the past week at  home.  Readings have been for the most part > 140/80 at rest . Patient is following a reduced salt diet most days and is taking medications as prescribed   Outpatient Medications Prior to Visit  Medication Sig Dispense Refill  . aspirin 81 MG tablet Take 81 mg by mouth daily.      Marland Kitchen. atorvastatin (LIPITOR) 20 MG tablet Take 1 tablet (20 mg total) by mouth daily. 90 tablet 3  . B-12 TR 1000 MCG TBCR place 1 tablet UNDER THE TONGUE daily 90 tablet 1  . buPROPion (WELLBUTRIN SR) 100 MG 12 hr tablet Take 1 tablet (100 mg total) by mouth 2 (two) times daily. 60 tablet 2  . buPROPion (WELLBUTRIN) 75 MG tablet take 1 tablet by mouth twice a day 180 tablet 1  . cholecalciferol (VITAMIN D) 1000 UNITS tablet Take 1,000 Units by mouth daily.      . Cyanocobalamin 1000 MCG SUBL Place 1 tablet (1,000 mcg total) under the tongue daily. 90 tablet 3  . Docusate Calcium (STOOL SOFTENER PO) Take by mouth.      . estradiol (ESTRACE) 1 MG tablet Take 1 mg by mouth daily.      . fluticasone (FLONASE) 50 MCG/ACT nasal spray instill 2 sprays into each nostril once daily 16 g 2  . losartan (COZAAR) 100 MG tablet Take 1 tablet (100 mg total) by mouth daily. 90 tablet 1  . PREVIDENT 5000 DRY MOUTH 1.1 % GEL dental  gel     . vitamin E 400 UNIT capsule Take 400 Units by mouth daily.      Marland Kitchen. amLODipine (NORVASC) 5 MG tablet Take 1 tablet (5 mg total) by mouth daily. (Patient not taking: Reported on 06/25/2016) 90 tablet 3  . traZODone (DESYREL) 50 MG tablet Take 0.5-1 tablets (25-50 mg total) by mouth at bedtime as needed for sleep. (Patient not taking: Reported on 06/25/2016) 90 tablet 1   Facility-Administered Medications Prior to Visit  Medication Dose Route Frequency Provider Last Rate Last Dose  . methylPREDNISolone acetate (DEPO-MEDROL) injection 40 mg  40 mg Intramuscular Once Sherlene Shamseresa L Talor Cheema, MD        Review of Systems;  Patient denies headache, fevers, malaise, unintentional weight loss, skin rash, eye pain, sinus congestion and sinus pain, sore throat, dysphagia,  hemoptysis , cough, dyspnea, wheezing, chest pain, palpitations, orthopnea, edema, abdominal pain, nausea, melena, diarrhea, constipation, flank pain, dysuria, hematuria, urinary  Frequency, nocturia, numbness, tingling, seizures,  Focal weakness, Loss of consciousness,  Tremor, insomnia, depression, anxiety, and suicidal ideation.      Objective:  BP (!) 160/90   Pulse 84   Temp 99 F (37.2 C) (Oral)   Resp 16   Wt 161 lb (73 kg)   SpO2 96%   BMI 27.64 kg/m   BP  Readings from Last 3 Encounters:  06/25/16 (!) 160/90  05/15/16 (!) 164/90  12/13/15 (!) 164/86    Wt Readings from Last 3 Encounters:  06/25/16 161 lb (73 kg)  05/15/16 162 lb (73.5 kg)  12/13/15 164 lb 12 oz (74.7 kg)    General appearance: alert, cooperative and appears stated age Ears: normal TM's and external ear canals both ears Throat: lips, mucosa, and tongue normal; teeth and gums normal Neck: no adenopathy, no carotid bruit, supple, symmetrical, trachea midline and thyroid not enlarged, symmetric, no tenderness/mass/nodules Back: symmetric, no curvature. ROM normal. No CVA tenderness. Lungs: clear to auscultation bilaterally Heart: regular rate and  rhythm, S1, S2 normal, no murmur, click, rub or gallop Abdomen: soft, non-tender; bowel sounds normal; no masses,  no organomegaly Pulses: 2+ and symmetric Skin: Skin color, texture, turgor normal. No rashes or lesions Lymph nodes: Cervical, supraclavicular, and axillary nodes normal.  Lab Results  Component Value Date   HGBA1C 5.7 05/15/2016   HGBA1C 5.7 11/13/2015   HGBA1C 5.5 10/25/2014    Lab Results  Component Value Date   CREATININE 0.81 05/15/2016   CREATININE 0.64 11/13/2015   CREATININE 0.74 12/28/2014    Lab Results  Component Value Date   WBC 4.9 10/19/2013   HGB 13.9 10/19/2013   HCT 40.8 10/19/2013   HCT 41.4 10/19/2013   PLT 262.0 10/19/2013   GLUCOSE 107 (H) 05/15/2016   CHOL 160 05/15/2016   TRIG 147.0 05/15/2016   HDL 63.30 05/15/2016   LDLDIRECT 72.0 05/15/2016   LDLCALC 68 05/15/2016   ALT 16 05/15/2016   AST 13 05/15/2016   NA 138 05/15/2016   K 4.2 05/15/2016   CL 101 05/15/2016   CREATININE 0.81 05/15/2016   BUN 13 05/15/2016   CO2 30 05/15/2016   TSH 1.01 11/13/2015   HGBA1C 5.7 05/15/2016   MICROALBUR <0.7 05/15/2016     Assessment & Plan:   Problem List Items Addressed This Visit    Hypertension    Elevated today again and at home.  Adding amlodipine  5 mg daily.  continue losartan 100 mg       Major depressive disorder, recurrent episode with melancholic features (HCC)    Improved with  wellbutrin Sr 100 mg bid, no changes today       UTI (urinary tract infection)    Empiric ciprofloxacin pending culture data.        Other Visit Diagnoses    Dysuria       Relevant Orders   POCT urinalysis dipstick (Completed)   Urine culture (Completed)   Urine Microscopic Only (Completed)      I am having Ms. Kercheval start on ciprofloxacin. I am also having her maintain her cholecalciferol, estradiol, vitamin E, aspirin, Docusate Calcium (STOOL SOFTENER PO), PREVIDENT 5000 DRY MOUTH, Cyanocobalamin, atorvastatin, losartan, B-12 TR,  fluticasone, buPROPion, buPROPion, traZODone, and amLODipine. We will continue to administer methylPREDNISolone acetate.  Meds ordered this encounter  Medications  . ciprofloxacin (CIPRO) 250 MG tablet    Sig: Take 1 tablet (250 mg total) by mouth 2 (two) times daily.    Dispense:  10 tablet    Refill:  0    There are no discontinued medications.  Follow-up: No Follow-up on file.   Sherlene Shams, MD

## 2016-06-27 ENCOUNTER — Telehealth: Payer: Self-pay | Admitting: Internal Medicine

## 2016-06-27 LAB — URINE CULTURE

## 2016-06-27 NOTE — Assessment & Plan Note (Addendum)
Elevated today again and at home.  Adding amlodipine  5 mg daily.  continue losartan 100 mg

## 2016-06-27 NOTE — Assessment & Plan Note (Signed)
Empiric ciprofloxacin pending culture data.

## 2016-06-27 NOTE — Assessment & Plan Note (Signed)
Improved with  wellbutrin Sr 100 mg bid, no changes today

## 2016-07-06 ENCOUNTER — Encounter: Payer: Self-pay | Admitting: Internal Medicine

## 2016-07-22 ENCOUNTER — Other Ambulatory Visit: Payer: Self-pay | Admitting: Internal Medicine

## 2016-08-22 ENCOUNTER — Other Ambulatory Visit: Payer: Self-pay | Admitting: Internal Medicine

## 2016-08-24 ENCOUNTER — Other Ambulatory Visit: Payer: Self-pay | Admitting: Internal Medicine

## 2016-10-12 ENCOUNTER — Other Ambulatory Visit: Payer: Self-pay | Admitting: Internal Medicine

## 2016-10-12 DIAGNOSIS — E785 Hyperlipidemia, unspecified: Secondary | ICD-10-CM

## 2016-10-21 ENCOUNTER — Other Ambulatory Visit: Payer: Self-pay | Admitting: Internal Medicine

## 2016-11-09 ENCOUNTER — Other Ambulatory Visit: Payer: Medicare Other

## 2016-11-10 ENCOUNTER — Telehealth: Payer: Self-pay | Admitting: Radiology

## 2016-11-10 DIAGNOSIS — E782 Mixed hyperlipidemia: Secondary | ICD-10-CM

## 2016-11-10 DIAGNOSIS — I10 Essential (primary) hypertension: Secondary | ICD-10-CM

## 2016-11-10 DIAGNOSIS — E559 Vitamin D deficiency, unspecified: Secondary | ICD-10-CM

## 2016-11-10 DIAGNOSIS — E538 Deficiency of other specified B group vitamins: Secondary | ICD-10-CM

## 2016-11-10 DIAGNOSIS — G63 Polyneuropathy in diseases classified elsewhere: Secondary | ICD-10-CM

## 2016-11-10 NOTE — Telephone Encounter (Signed)
Fasting labs ordered

## 2016-11-10 NOTE — Telephone Encounter (Signed)
Pt coming in for labs Thursday, please place future orders. Thank you.  

## 2016-11-10 NOTE — Addendum Note (Signed)
Addended by: Sherlene ShamsULLO, Rykar Lebleu L on: 11/10/2016 05:23 PM   Modules accepted: Orders

## 2016-11-12 ENCOUNTER — Other Ambulatory Visit (INDEPENDENT_AMBULATORY_CARE_PROVIDER_SITE_OTHER): Payer: Medicare Other

## 2016-11-12 DIAGNOSIS — G63 Polyneuropathy in diseases classified elsewhere: Secondary | ICD-10-CM

## 2016-11-12 DIAGNOSIS — E538 Deficiency of other specified B group vitamins: Secondary | ICD-10-CM | POA: Diagnosis not present

## 2016-11-12 DIAGNOSIS — I10 Essential (primary) hypertension: Secondary | ICD-10-CM

## 2016-11-12 DIAGNOSIS — E782 Mixed hyperlipidemia: Secondary | ICD-10-CM

## 2016-11-12 DIAGNOSIS — E559 Vitamin D deficiency, unspecified: Secondary | ICD-10-CM

## 2016-11-12 LAB — VITAMIN D 25 HYDROXY (VIT D DEFICIENCY, FRACTURES): VITD: 31.9 ng/mL (ref 30.00–100.00)

## 2016-11-12 LAB — COMPREHENSIVE METABOLIC PANEL
ALK PHOS: 54 U/L (ref 39–117)
ALT: 16 U/L (ref 0–35)
AST: 14 U/L (ref 0–37)
Albumin: 4.1 g/dL (ref 3.5–5.2)
BILIRUBIN TOTAL: 0.6 mg/dL (ref 0.2–1.2)
BUN: 12 mg/dL (ref 6–23)
CALCIUM: 9.6 mg/dL (ref 8.4–10.5)
CO2: 32 meq/L (ref 19–32)
Chloride: 102 mEq/L (ref 96–112)
Creatinine, Ser: 0.85 mg/dL (ref 0.40–1.20)
GFR: 70.95 mL/min (ref >60.00)
GLUCOSE: 115 mg/dL — AB (ref 70–99)
POTASSIUM: 4.3 meq/L (ref 3.5–5.1)
Sodium: 140 mEq/L (ref 135–145)
Total Protein: 6.6 g/dL (ref 6.0–8.3)

## 2016-11-12 LAB — LIPID PANEL
CHOL/HDL RATIO: 3
Cholesterol: 170 mg/dL (ref 0–200)
HDL: 66.9 mg/dL (ref >39.00)
LDL Cholesterol: 74 mg/dL (ref 0–99)
NonHDL: 102.77
TRIGLYCERIDES: 144 mg/dL (ref 0.0–149.0)
VLDL: 28.8 mg/dL (ref 0.0–40.0)

## 2016-11-12 LAB — VITAMIN B12: Vitamin B-12: 1070 pg/mL — ABNORMAL HIGH (ref 211–911)

## 2016-11-13 ENCOUNTER — Encounter: Payer: Medicare Other | Admitting: Internal Medicine

## 2016-11-15 ENCOUNTER — Encounter: Payer: Self-pay | Admitting: Internal Medicine

## 2016-11-16 LAB — COLOGUARD: Cologuard: NEGATIVE

## 2016-11-19 ENCOUNTER — Telehealth: Payer: Self-pay | Admitting: Internal Medicine

## 2016-11-19 ENCOUNTER — Encounter: Payer: Self-pay | Admitting: Internal Medicine

## 2016-11-19 ENCOUNTER — Ambulatory Visit (INDEPENDENT_AMBULATORY_CARE_PROVIDER_SITE_OTHER): Payer: Medicare Other | Admitting: Internal Medicine

## 2016-11-19 VITALS — BP 126/74 | HR 74 | Temp 98.1°F | Resp 16 | Ht 64.0 in | Wt 160.2 lb

## 2016-11-19 DIAGNOSIS — E782 Mixed hyperlipidemia: Secondary | ICD-10-CM

## 2016-11-19 DIAGNOSIS — Z0000 Encounter for general adult medical examination without abnormal findings: Secondary | ICD-10-CM

## 2016-11-19 DIAGNOSIS — M539 Dorsopathy, unspecified: Secondary | ICD-10-CM

## 2016-11-19 DIAGNOSIS — R7301 Impaired fasting glucose: Secondary | ICD-10-CM | POA: Diagnosis not present

## 2016-11-19 DIAGNOSIS — R42 Dizziness and giddiness: Secondary | ICD-10-CM | POA: Diagnosis not present

## 2016-11-19 DIAGNOSIS — R5383 Other fatigue: Secondary | ICD-10-CM

## 2016-11-19 DIAGNOSIS — G63 Polyneuropathy in diseases classified elsewhere: Secondary | ICD-10-CM

## 2016-11-19 DIAGNOSIS — I10 Essential (primary) hypertension: Secondary | ICD-10-CM | POA: Diagnosis not present

## 2016-11-19 DIAGNOSIS — E538 Deficiency of other specified B group vitamins: Secondary | ICD-10-CM | POA: Diagnosis not present

## 2016-11-19 DIAGNOSIS — M5386 Other specified dorsopathies, lumbar region: Secondary | ICD-10-CM

## 2016-11-19 LAB — POCT GLYCOSYLATED HEMOGLOBIN (HGB A1C): HEMOGLOBIN A1C: 5.4

## 2016-11-19 NOTE — Patient Instructions (Addendum)
I recommend taking the amlodipine at  bedtime and the losartan in the morning,  or vice versa to see if it alleviates the morning light headed ness   You can use up to 800 mg motrin every 8 hours for acute flares  of back pain Or aleve twice daily   Ok to combine with tylenol 2000 mg total maximum  daily dose   We can add gabapentin for sciatica pain if needed (bedtime dose)    The ShingRx vaccine is now available in local pharmacies and is much more protective thant Zostavax,  It is therefore ADVISED for all interested adults over 50 to prevent shingles   Your  Fasting  glucose is again elevated but not diagnostic of diabetes;   I recommend continued efforts at following  a  low glycemic index diet and  Regular participation in some form of  aerobic exercise .  I would like to see you again after we repeat your fasting labs in 6 months  There are low carb "on the go" breakfast drinks and bars:  1) try the premixed protein drinks (Atkins, AdvantEdge and the best tasting , highest protein one available is called " Premier Protein"; it is advocated by the bariatric surgeons for their patients and  available of < $2 serving at [facility] and  In bulk for $1.50/serving at [employer] and [employer]  .    Nutritional analysis :  160 cal  30 g protein  1 g sugar 50% calcium needs   [employer] Mart and [employer]   2) There are plenty of high protein  HIGH  carb cookies,  But only the following are low carb."  Look for them in the diet section of most grocery stores or vitamin shops,  where the protein shakes are  sold.   All of these have 5 g sugar varieties if you read the label  : Power crunch Atkins bars KIND : make sure you find the  "low glycemic index"  variety QUEST : (taste better after being microwaved for 5 sec OUT OF THE WRAPPER)  Low carb ice cream  Has become more popular and there are many more choices :  Halo Top  E enlightenment  Yazzo  frozen yogurt   Skinny Cow  Weight watchers

## 2016-11-19 NOTE — Telephone Encounter (Signed)
Pt would like to have labs done before her next appt in January. Please advise, thank you!  Call pt [phone]

## 2016-11-19 NOTE — Progress Notes (Addendum)
Patient ID: Diego CoryKaren K Makarewicz, female    DOB: 05-06-1951  Age: 66 y.o. MRN: [id number]  The patient is here for annual preventive examination and management of other chronic and acute problems.    Due for hep c screening [facility]  Had a right sided diagnostic  done march 2018 Pelvic exam done by GYN  Colonoscopy 2011  dexa jan 2018 Insurance sent her a home colon ca screening test   Recent labs reviewed  In detail with patient.   Daily dizziness after taking meds one hour prior   The risk factors are reflected in the social history.  The roster of all physicians providing medical care to patient - is listed in the Snapshot section of the chart.  Activities of daily living:  The patient is 100% independent in all ADLs: dressing, toileting, feeding as well as independent mobility  Home safety : The patient has smoke detectors in the home. They wear seatbelts.  There are no firearms at home. There is no violence in the home.   There is no risks for hepatitis, STDs or HIV. There is no   history of blood transfusion. They have no travel history to infectious disease endemic areas of the world.  The patient has seen their dentist in the last six month. They have seen their eye doctor in the last year. They admit to slight hearing difficulty with regard to whispered voices and some television programs.  They have deferred audiologic testing in the last year.  They do not  have excessive sun exposure. Discussed the need for sun protection: hats, long sleeves and use of sunscreen if there is significant sun exposure.   Diet: the importance of a healthy diet is discussed. They do have a healthy diet.  The benefits of regular aerobic exercise were discussed. She walks 4 times per week ,  20 minutes.   Depression screen: there are no signs or vegative symptoms of depression- irritability, change in appetite, anhedonia, sadness/tearfullness.  Cognitive assessment: the patient  manages all their financial and personal affairs and is actively engaged. They could relate day,date,year and events; recalled 2/3 objects at 3 minutes; performed clock-face test normally.  The following portions of the patient's history were reviewed and updated as appropriate: allergies, current medications, past family history, past medical history,  past surgical history, past social history  and problem list.  Visual acuity was not assessed per patient preference since she has regular follow up with her ophthalmologist. Hearing and body mass index were assessed and reviewed.   During the course of the visit the patient was educated and counseled about appropriate screening and preventive services including : fall prevention , diabetes screening, nutrition counseling, colorectal cancer screening, and recommended immunizations.    CC: The primary encounter diagnosis was Dizziness. Diagnoses of Impaired fasting glucose, B12 neuropathy (HCC), Essential hypertension, Routine general medical examination at a health care facility, and Mixed hyperlipidemia were also pertinent to this visit.   1)   Morning light headedness ,  occurs one hour after taking her morning medications which include losartan and amlodipine.  Has not checked blood pressure during this tine.  advised to take amlodipine at bedtime instead of morning   2) Low back pain aggravated by activities performed during provision of daycare of 2 yr old grandson.  History of herniated disk with sciatica left side.  No longer seeing chiropractor.     History Clydie BraunKaren has a past medical history of Colon polyp (2001)  and Strabismus (age 50).   She has a past surgical history that includes Hemorrhoid surgery; Gallbladder surgery; Eye surgery (1956); Bilateral oophorectomy (2000); Abdominal hysterectomy (2000); and Excision basal cell carcinoma (Left, 2013).   Her family history includes Cancer in her brother, father, maternal grandmother, and  mother.She reports that she has never smoked. She has never used smokeless tobacco. She reports that she does not drink alcohol or use drugs.  Outpatient Medications Prior to Visit  Medication Sig Dispense Refill  . amLODipine (NORVASC) 5 MG tablet Take 1 tablet (5 mg total) by mouth daily. 90 tablet 3  . aspirin 81 MG tablet Take 81 mg by mouth daily.      Marland Kitchen atorvastatin (LIPITOR) 20 MG tablet take 1 tablet by mouth once daily 90 tablet 3  . buPROPion (WELLBUTRIN SR) 100 MG 12 hr tablet take 1 tablet by mouth twice a day 60 tablet 2  . cholecalciferol (VITAMIN D) 1000 UNITS tablet Take 1,000 Units by mouth daily.      . Cyanocobalamin 1000 MCG SUBL Place 1 tablet (1,000 mcg total) under the tongue daily. 90 tablet 3  . Docusate Calcium (STOOL SOFTENER PO) Take by mouth.      . estradiol (ESTRACE) 1 MG tablet Take 1 mg by mouth daily.      . fluticasone (FLONASE) 50 MCG/ACT nasal spray instill 2 sprays into each nostril once daily 16 g 2  . losartan (COZAAR) 100 MG tablet take 1 tablet by mouth once daily 90 tablet 0  . PREVIDENT 5000 DRY MOUTH 1.1 % GEL dental gel     . vitamin E 400 UNIT capsule Take 400 Units by mouth daily.      . B-12 TR 1000 MCG TBCR place 1 tablet UNDER THE TONGUE daily (Patient not taking: Reported on 11/19/2016) 90 tablet 1  . buPROPion (WELLBUTRIN) 75 MG tablet take 1 tablet by mouth twice a day (Patient not taking: Reported on 11/19/2016) 180 tablet 1  . ciprofloxacin (CIPRO) 250 MG tablet Take 1 tablet (250 mg total) by mouth 2 (two) times daily. (Patient not taking: Reported on 11/19/2016) 10 tablet 0  . traZODone (DESYREL) 50 MG tablet Take 0.5-1 tablets (25-50 mg total) by mouth at bedtime as needed for sleep. (Patient not taking: Reported on 06/25/2016) 90 tablet 1   Facility-Administered Medications Prior to Visit  Medication Dose Route Frequency Provider Last Rate Last Dose  . methylPREDNISolone acetate (DEPO-MEDROL) injection 40 mg  40 mg Intramuscular Once Sherlene Shams, MD        Review of Systems   Patient denies headache, fevers, malaise, unintentional weight loss, skin rash, eye pain, sinus congestion and sinus pain, sore throat, dysphagia,  hemoptysis , cough, dyspnea, wheezing, chest pain, palpitations, orthopnea, edema, abdominal pain, nausea, melena, diarrhea, constipation, flank pain, dysuria, hematuria, urinary  Frequency, nocturia, numbness, tingling, seizures,  Focal weakness, Loss of consciousness,  Tremor, insomnia, depression, anxiety, and suicidal ideation.      Objective:  BP 126/74 (BP Location: Left Arm, Patient Position: Sitting, Cuff Size: Normal)   Pulse 74   Temp 98.1 F (36.7 C) (Oral)   Resp 16   Ht 5\' 4"  (1.626 m)   Wt 160 lb 3.2 oz (72.7 kg)   SpO2 98%   BMI 27.50 kg/m   Physical Exam   General appearance: alert, cooperative and appears stated age Head: Normocephalic, without obvious abnormality, atraumatic Eyes: conjunctivae/corneas clear. PERRL, EOM's intact. Fundi benign. Ears: normal TM's and external ear  canals both ears Nose: Nares normal. Septum midline. Mucosa normal. No drainage or sinus tenderness. Throat: lips, mucosa, and tongue normal; teeth and gums normal Neck: no adenopathy, no carotid bruit, no JVD, supple, symmetrical, trachea midline and thyroid not enlarged, symmetric, no tenderness/mass/nodules Lungs: clear to auscultation bilaterally Breasts: normal appearance, no masses or tenderness Heart: regular rate and rhythm, S1, S2 normal, no murmur, click, rub or gallop Abdomen: soft, non-tender; bowel sounds normal; no masses,  no organomegaly Extremities: extremities normal, atraumatic, no cyanosis or edema Pulses: 2+ and symmetric Skin: Skin color, texture, turgor normal. No rashes or lesions Neurologic: Alert and oriented X 3, normal strength and tone. Normal symmetric reflexes. Normal coordination and gait.      Assessment & Plan:   Problem List Items Addressed This Visit     Hypertension    Well controlled on current regimen. Renal function stable, no changes today.  Lab Results  Component Value Date   CREATININE 0.85 11/12/2016   Lab Results  Component Value Date   NA 140 11/12/2016   K 4.3 11/12/2016   CL 102 11/12/2016   CO2 32 11/12/2016         Mixed hyperlipidemia    LDL and triglycerides are at goal on current medications. She has no side effects and liver enzymes are normal. No changes today.    Lab Results  Component Value Date   CHOL 170 11/12/2016   HDL 66.90 11/12/2016   LDLCALC 74 11/12/2016   LDLDIRECT 72.0 05/15/2016   TRIG 144.0 11/12/2016   CHOLHDL 3 11/12/2016   Lab Results  Component Value Date   ALT 16 11/12/2016   AST 14 11/12/2016   ALKPHOS 54 11/12/2016   BILITOT 0.6 11/12/2016          Routine general medical examination at a health care facility    Annual comprehensive preventive exam was done as well as an evaluation and management of chronic conditions .  During the course of the visit the patient was educated and counseled about appropriate screening and preventive services including :  diabetes screening, lipid analysis with projected  10 year  risk for CAD , nutrition counseling, breast, cervical and colorectal cancer screening, and recommended immunizations.  Printed recommendations for health maintenance screenings was given      B12 neuropathy (HCC)    Managed with sublingual vitamin supplement.   Lab Results  Component Value Date   VITAMINB12 1,070 (H) 11/12/2016         Impaired fasting glucose    A1c is normal.  Low GI diet and regular exercise recommended   Lab Results  Component Value Date   HGBA1C 5.4 11/19/2016         Relevant Orders   POCT HgB A1C (Completed)   Dizziness - Primary     orthostatics today note a drop in diastolic  from 78 to 58 with upright position.  Advised to split bp medications up and take amlodipine in the evening and losartan in the morning.          I  have discontinued Ms. Aldaba's B-12 TR, traZODone, and ciprofloxacin. I am also having her maintain her cholecalciferol, estradiol, vitamin E, aspirin, Docusate Calcium (STOOL SOFTENER PO), PREVIDENT 5000 DRY MOUTH, Cyanocobalamin, amLODipine, buPROPion, losartan, atorvastatin, and fluticasone. We will continue to administer methylPREDNISolone acetate.  No orders of the defined types were placed in this encounter.   Medications Discontinued During This Encounter  Medication Reason  . B-12 TR 1000 MCG  TBCR Patient has not taken in last 30 days  . buPROPion (WELLBUTRIN) 75 MG tablet Patient has not taken in last 30 days  . ciprofloxacin (CIPRO) 250 MG tablet Therapy completed  . traZODone (DESYREL) 50 MG tablet Patient has not taken in last 30 days    Follow-up: Return in about 6 months (around 05/22/2017).   Sherlene Shams, MD

## 2016-11-19 NOTE — Telephone Encounter (Signed)
Labs have been ordered and lab appt has been scheduled. Pt is aware of appt date and time.  

## 2016-11-21 DIAGNOSIS — M5386 Other specified dorsopathies, lumbar region: Secondary | ICD-10-CM | POA: Insufficient documentation

## 2016-11-21 DIAGNOSIS — R42 Dizziness and giddiness: Secondary | ICD-10-CM | POA: Insufficient documentation

## 2016-11-21 NOTE — Assessment & Plan Note (Signed)
A1c is normal.  Low GI diet and regular exercise recommended   Lab Results  Component Value Date   HGBA1C 5.4 11/19/2016

## 2016-11-21 NOTE — Assessment & Plan Note (Signed)
orthostatics today note a drop in diastolic  from 78 to 58 with upright position.  Advised to split bp medications up and take amlodipine in the evening and losartan in the morning.

## 2016-11-21 NOTE — Assessment & Plan Note (Signed)
Well controlled on current regimen. Renal function stable, no changes today.  Lab Results  Component Value Date   CREATININE 0.85 11/12/2016   Lab Results  Component Value Date   NA 140 11/12/2016   K 4.3 11/12/2016   CL 102 11/12/2016   CO2 32 11/12/2016

## 2016-11-21 NOTE — Assessment & Plan Note (Signed)
Managed with sublingual vitamin supplement.   Lab Results  Component Value Date   VITAMINB12 1,070 (H) 11/12/2016

## 2016-11-21 NOTE — Assessment & Plan Note (Signed)
Intermittent ,  Brought on with ligting her grandson.  Prn gabapentin offered

## 2016-11-21 NOTE — Assessment & Plan Note (Signed)
Annual comprehensive preventive exam was done as well as an evaluation and management of chronic conditions .  During the course of the visit the patient was educated and counseled about appropriate screening and preventive services including :  diabetes screening, lipid analysis with projected  10 year  risk for CAD , nutrition counseling, breast, cervical and colorectal cancer screening, and recommended immunizations.  Printed recommendations for health maintenance screenings was given 

## 2016-11-21 NOTE — Assessment & Plan Note (Signed)
LDL and triglycerides are at goal on current medications. She has no side effects and liver enzymes are normal. No changes today.    Lab Results  Component Value Date   CHOL 170 11/12/2016   HDL 66.90 11/12/2016   LDLCALC 74 11/12/2016   LDLDIRECT 72.0 05/15/2016   TRIG 144.0 11/12/2016   CHOLHDL 3 11/12/2016   Lab Results  Component Value Date   ALT 16 11/12/2016   AST 14 11/12/2016   ALKPHOS 54 11/12/2016   BILITOT 0.6 11/12/2016

## 2016-11-24 ENCOUNTER — Other Ambulatory Visit: Payer: Self-pay | Admitting: Internal Medicine

## 2016-11-25 ENCOUNTER — Other Ambulatory Visit: Payer: Self-pay | Admitting: Internal Medicine

## 2017-01-13 ENCOUNTER — Other Ambulatory Visit: Payer: Self-pay | Admitting: Internal Medicine

## 2017-02-10 NOTE — Telephone Encounter (Signed)
Error

## 2017-02-19 ENCOUNTER — Other Ambulatory Visit: Payer: Self-pay | Admitting: Internal Medicine

## 2017-02-24 ENCOUNTER — Other Ambulatory Visit: Payer: Self-pay | Admitting: Internal Medicine

## 2017-04-20 ENCOUNTER — Other Ambulatory Visit: Payer: Self-pay | Admitting: Internal Medicine

## 2017-05-17 ENCOUNTER — Other Ambulatory Visit (INDEPENDENT_AMBULATORY_CARE_PROVIDER_SITE_OTHER): Payer: Medicare Other

## 2017-05-17 DIAGNOSIS — R7301 Impaired fasting glucose: Secondary | ICD-10-CM

## 2017-05-17 DIAGNOSIS — R5383 Other fatigue: Secondary | ICD-10-CM

## 2017-05-17 DIAGNOSIS — E782 Mixed hyperlipidemia: Secondary | ICD-10-CM | POA: Diagnosis not present

## 2017-05-17 LAB — COMPREHENSIVE METABOLIC PANEL
ALBUMIN: 3.8 g/dL (ref 3.5–5.2)
ALT: 15 U/L (ref 0–35)
AST: 14 U/L (ref 0–37)
Alkaline Phosphatase: 49 U/L (ref 39–117)
BUN: 13 mg/dL (ref 6–23)
CHLORIDE: 103 meq/L (ref 96–112)
CO2: 29 meq/L (ref 19–32)
CREATININE: 0.79 mg/dL (ref 0.40–1.20)
Calcium: 8.8 mg/dL (ref 8.4–10.5)
GFR: 77.08 mL/min (ref >60.00)
GLUCOSE: 117 mg/dL — AB (ref 70–99)
POTASSIUM: 3.7 meq/L (ref 3.5–5.1)
SODIUM: 139 meq/L (ref 135–145)
Total Bilirubin: 0.4 mg/dL (ref 0.2–1.2)
Total Protein: 6.6 g/dL (ref 6.0–8.3)

## 2017-05-17 LAB — CBC WITH DIFFERENTIAL/PLATELET
BASOS PCT: 1.1 % (ref 0.0–3.0)
Basophils Absolute: 0.1 10*3/uL (ref 0.0–0.1)
EOS ABS: 0.2 10*3/uL (ref 0.0–0.7)
EOS PCT: 3.3 % (ref 0.0–5.0)
HCT: 40.5 % (ref 36.0–46.0)
HEMOGLOBIN: 13.7 g/dL (ref 12.0–15.0)
LYMPHS ABS: 1.4 10*3/uL (ref 0.7–4.0)
Lymphocytes Relative: 25.4 % (ref 12.0–46.0)
MCHC: 33.9 g/dL (ref 30.0–36.0)
MCV: 96.5 fl (ref 78.0–100.0)
MONO ABS: 0.5 10*3/uL (ref 0.1–1.0)
Monocytes Relative: 9.5 % (ref 3.0–12.0)
NEUTROS PCT: 60.7 % (ref 43.0–77.0)
Neutro Abs: 3.3 10*3/uL (ref 1.4–7.7)
Platelets: 246 10*3/uL (ref 150.0–400.0)
RBC: 4.2 Mil/uL (ref 3.87–5.11)
RDW: 12.4 % (ref 11.5–15.5)
WBC: 5.5 10*3/uL (ref 4.0–10.5)

## 2017-05-17 LAB — LIPID PANEL
CHOL/HDL RATIO: 2
Cholesterol: 150 mg/dL (ref 0–200)
HDL: 62.5 mg/dL (ref >39.00)
LDL CALC: 62 mg/dL (ref 0–99)
NONHDL: 87.79
Triglycerides: 128 mg/dL (ref 0.0–149.0)
VLDL: 25.6 mg/dL (ref 0.0–40.0)

## 2017-05-17 LAB — TSH: TSH: 1.78 u[IU]/mL (ref 0.35–4.50)

## 2017-05-17 LAB — HEMOGLOBIN A1C: HEMOGLOBIN A1C: 5.7 % (ref 4.6–6.5)

## 2017-05-18 ENCOUNTER — Encounter: Payer: Self-pay | Admitting: Internal Medicine

## 2017-05-19 ENCOUNTER — Other Ambulatory Visit: Payer: Self-pay | Admitting: Internal Medicine

## 2017-05-24 ENCOUNTER — Encounter: Payer: Self-pay | Admitting: Internal Medicine

## 2017-05-24 ENCOUNTER — Ambulatory Visit: Payer: Medicare Other | Admitting: Internal Medicine

## 2017-05-24 VITALS — BP 140/78 | HR 76 | Temp 98.1°F | Resp 14 | Ht 64.0 in | Wt 161.4 lb

## 2017-05-24 DIAGNOSIS — E559 Vitamin D deficiency, unspecified: Secondary | ICD-10-CM

## 2017-05-24 DIAGNOSIS — Z1239 Encounter for other screening for malignant neoplasm of breast: Secondary | ICD-10-CM

## 2017-05-24 DIAGNOSIS — F339 Major depressive disorder, recurrent, unspecified: Secondary | ICD-10-CM | POA: Diagnosis not present

## 2017-05-24 DIAGNOSIS — R5383 Other fatigue: Secondary | ICD-10-CM

## 2017-05-24 DIAGNOSIS — R7301 Impaired fasting glucose: Secondary | ICD-10-CM | POA: Diagnosis not present

## 2017-05-24 DIAGNOSIS — I10 Essential (primary) hypertension: Secondary | ICD-10-CM | POA: Diagnosis not present

## 2017-05-24 DIAGNOSIS — F5105 Insomnia due to other mental disorder: Secondary | ICD-10-CM | POA: Diagnosis not present

## 2017-05-24 DIAGNOSIS — E782 Mixed hyperlipidemia: Secondary | ICD-10-CM

## 2017-05-24 DIAGNOSIS — R42 Dizziness and giddiness: Secondary | ICD-10-CM

## 2017-05-24 DIAGNOSIS — Z1231 Encounter for screening mammogram for malignant neoplasm of breast: Secondary | ICD-10-CM | POA: Diagnosis not present

## 2017-05-24 DIAGNOSIS — E538 Deficiency of other specified B group vitamins: Secondary | ICD-10-CM | POA: Diagnosis not present

## 2017-05-24 DIAGNOSIS — G63 Polyneuropathy in diseases classified elsewhere: Secondary | ICD-10-CM | POA: Diagnosis not present

## 2017-05-24 DIAGNOSIS — F409 Phobic anxiety disorder, unspecified: Secondary | ICD-10-CM | POA: Diagnosis not present

## 2017-05-24 MED ORDER — ALPRAZOLAM 0.25 MG PO TABS
0.2500 mg | ORAL_TABLET | Freq: Every evening | ORAL | 0 refills | Status: DC | PRN
Start: 2017-05-24 — End: 2017-11-22

## 2017-05-24 MED ORDER — ZOSTER VAC RECOMB ADJUVANTED 50 MCG/0.5ML IM SUSR: 0.5000 mL | Freq: Once | INTRAMUSCULAR | 1 refills | Status: AC

## 2017-05-24 MED ORDER — ZOSTER VAC RECOMB ADJUVANTED 50 MCG/0.5ML IM SUSR: 0.5000 mL | Freq: Once | INTRAMUSCULAR | 1 refills | Status: DC

## 2017-05-24 NOTE — Assessment & Plan Note (Signed)
She has frequent nights of early waking around 2 am.  Did not tolerate prir trials of trazodone and melatonin due ot excessive sedation.  Trial of 1/2 talbet alprazolam prn early waking

## 2017-05-24 NOTE — Assessment & Plan Note (Signed)
Symptoms are controlled on wellbutrin, with the exdeption of fatigeu in mid afternoon,  Which is likely deit or activity related,  Or due to interrupted sleep . No changes today

## 2017-05-24 NOTE — Assessment & Plan Note (Signed)
he reports compliance with medication regimen of amlodipine 5 mg and losartan 100 mg .  She  has an elevated reading today in office.  sHe has been asked to check his BP at work and  submit readings for evaluation. Renal function will be checked today

## 2017-05-24 NOTE — Patient Instructions (Signed)
  The new goals for optimal blood pressure management are 120/70.  Please check your blood pressure a few times at home and send me the readings so I can determine if you need a change in medication .  As long are you are under 130/80,  No changes will be needed   Alprazolam 1/2 tablet as needed for early waking   I will order your mAMMOGRAM   The ShingRx vaccine is now available in local pharmacies and is much more protective thant Zostavaxs,  It is therefore ADVISED for all interested adults over 50 to prevent shingles   SEE YOU IN 6 MONTHS FOR YOUR cpe,  IF yOU WOULD LIKE TO HAVE LABS PRIOR TO YOUR VISIT,  CALL AND ARRANGE

## 2017-05-24 NOTE — Assessment & Plan Note (Signed)
Multifactorial,  Due to interrupted sleep (early waking) and lack of exercise .  Encouraged to walk in the early afternoon, trial of alprazolam prn early waking

## 2017-05-24 NOTE — Progress Notes (Signed)
Subjective:  Patient ID: Shelley Zuniga, female    DOB: [birth year]  Age: 67 y.o. MRN: [id number]  CC: The primary encounter diagnosis was Screening for breast cancer. Diagnoses of Vitamin D deficiency, Dizziness, Essential hypertension, Mixed hyperlipidemia, Impaired fasting glucose, Vitamin B12 deficiency, Major depressive disorder, recurrent episode with melancholic features (HCC), Insomnia due to anxiety and fear, Other fatigue, and B12 neuropathy (HCC) were also pertinent to this visit.  HPI Shelley Zuniga presents for follow up on depression ,  hypertension and hyperlipidemia  Her depressive symptoms have resolved, but she continues to have periods of low energy in the afternoon.  Occurs around 3 pm.  Not sleeping well due to frequent wakenings. .     Patient is taking her medications as prescribed and notes no adverse effects.  Home BP readings have been done about once per week and are  generally < 130/80 .  She is avoiding added salt in her diet and walking regularly about 3 times per week for exercise  .    Outpatient Medications Prior to Visit  Medication Sig Dispense Refill  . amLODipine (NORVASC) 5 MG tablet Take 1 tablet (5 mg total) by mouth daily. 90 tablet 3  . aspirin 81 MG tablet Take 81 mg by mouth daily.      Marland Kitchen atorvastatin (LIPITOR) 20 MG tablet take 1 tablet by mouth once daily 90 tablet 3  . buPROPion (WELLBUTRIN SR) 100 MG 12 hr tablet take 1 tablet by mouth twice a day 60 tablet 2  . cholecalciferol (VITAMIN D) 1000 UNITS tablet Take 1,000 Units by mouth daily.     . Cyanocobalamin 1000 MCG SUBL Place 1 tablet (1,000 mcg total) under the tongue daily. 90 tablet 3  . Docusate Calcium (STOOL SOFTENER PO) Take by mouth.      . estradiol (ESTRACE) 1 MG tablet Take 1 mg by mouth daily.      . fluticasone (FLONASE) 50 MCG/ACT nasal spray instill 2 sprays into each nostril once daily 16 g 2  . losartan (COZAAR) 100 MG tablet take 1 tablet by mouth once daily 90 tablet 0    . PREVIDENT 5000 DRY MOUTH 1.1 % GEL dental gel     . vitamin E 400 UNIT capsule Take 400 Units by mouth daily.       Facility-Administered Medications Prior to Visit  Medication Dose Route Frequency Provider Last Rate Last Dose  . methylPREDNISolone acetate (DEPO-MEDROL) injection 40 mg  40 mg Intramuscular Once Sherlene Shams, MD        Review of Systems;  Patient denies headache, fevers, malaise, unintentional weight loss, skin rash, eye pain, sinus congestion and sinus pain, sore throat, dysphagia,  hemoptysis , cough, dyspnea, wheezing, chest pain, palpitations, orthopnea, edema, abdominal pain, nausea, melena, diarrhea, constipation, flank pain, dysuria, hematuria, urinary  Frequency, nocturia, numbness, tingling, seizures,  Focal weakness, Loss of consciousness,  Tremor, insomnia, depression, anxiety, and suicidal ideation.      Objective:  BP 140/78 (BP Location: Left Arm, Patient Position: Sitting, Cuff Size: Normal)   Pulse 76   Temp 98.1 F (36.7 C) (Oral)   Resp 14   Ht 5\' 4"  (1.626 m)   Wt 161 lb 6.4 oz (73.2 kg)   SpO2 92%   BMI 27.70 kg/m   BP Readings from Last 3 Encounters:  05/24/17 140/78  11/19/16 126/74  06/25/16 (!) 160/90    Wt Readings from Last 3 Encounters:  05/24/17 161 lb 6.4 oz (73.2  kg)  11/19/16 160 lb 3.2 oz (72.7 kg)  06/25/16 161 lb (73 kg)    General appearance: alert, cooperative and appears stated age Ears: normal TM's and external ear canals both ears Throat: lips, mucosa, and tongue normal; teeth and gums normal Neck: no adenopathy, no carotid bruit, supple, symmetrical, trachea midline and thyroid not enlarged, symmetric, no tenderness/mass/nodules Back: symmetric, no curvature. ROM normal. No CVA tenderness. Lungs: clear to auscultation bilaterally Heart: regular rate and rhythm, S1, S2 normal, no murmur, click, rub or gallop Abdomen: soft, non-tender; bowel sounds normal; no masses,  no organomegaly Pulses: 2+ and  symmetric Skin: Skin color, texture, turgor normal. No rashes or lesions Lymph nodes: Cervical, supraclavicular, and axillary nodes normal.  Lab Results  Component Value Date   HGBA1C 5.7 05/17/2017   HGBA1C 5.4 11/19/2016   HGBA1C 5.7 05/15/2016    Lab Results  Component Value Date   CREATININE 0.79 05/17/2017   CREATININE 0.85 11/12/2016   CREATININE 0.81 05/15/2016    Lab Results  Component Value Date   WBC 5.5 05/17/2017   HGB 13.7 05/17/2017   HCT 40.5 05/17/2017   PLT 246.0 05/17/2017   GLUCOSE 117 (H) 05/17/2017   CHOL 150 05/17/2017   TRIG 128.0 05/17/2017   HDL 62.50 05/17/2017   LDLDIRECT 72.0 05/15/2016   LDLCALC 62 05/17/2017   ALT 15 05/17/2017   AST 14 05/17/2017   NA 139 05/17/2017   K 3.7 05/17/2017   CL 103 05/17/2017   CREATININE 0.79 05/17/2017   BUN 13 05/17/2017   CO2 29 05/17/2017   TSH 1.78 05/17/2017   HGBA1C 5.7 05/17/2017   MICROALBUR <0.7 05/15/2016    Dg Knee Complete 4 Views Left  Result Date: 10/19/2013 CLINICAL DATA:  Larey SeatFell 1 month ago with pain EXAM: LEFT KNEE - COMPLETE 4+ VIEW COMPARISON:  None. FINDINGS: The left knee joint spaces appear normal. No fracture is seen. No effusion is noted. Calcification is noted in the soft tissues of the left knee medially just below the level of the knee joint most likely vascular in origin. IMPRESSION: Negative. Electronically Signed   By: Dwyane DeePaul  Barry M.D.   On: 10/19/2013 13:07    Assessment & Plan:   Problem List Items Addressed This Visit    Screening for breast cancer - Primary   Relevant Orders   MM SCREENING BREAST TOMO BILATERAL   Vitamin D deficiency   Relevant Orders   VITAMIN D 25 Hydroxy (Vit-D Deficiency, Fractures)   Mixed hyperlipidemia   Relevant Orders   Lipid panel   Fatigue    Multifactorial,  Due to interrupted sleep (early waking) and lack of exercise .  Encouraged to walk in the early afternoon, trial of alprazolam prn early waking       Hypertension    he reports  compliance with medication regimen of amlodipine 5 mg and losartan 100 mg .  She  has an elevated reading today in office.  sHe has been asked to check his BP at work and  submit readings for evaluation. Renal function will be checked today      Relevant Orders   Comprehensive metabolic panel   Microalbumin / creatinine urine ratio   RESOLVED: Insomnia due to anxiety and fear    She has frequent nights of early waking around 2 am.  Did not tolerate prir trials of trazodone and melatonin due ot excessive sedation.  Trial of 1/2 talbet alprazolam prn early waking      RESOLVED:  Major depressive disorder, recurrent episode with melancholic features (HCC)    Symptoms are controlled on wellbutrin, with the exdeption of fatigeu in mid afternoon,  Which is likely deit or activity related,  Or due to interrupted sleep . No changes today      Relevant Medications   ALPRAZolam (XANAX) 0.25 MG tablet   B12 neuropathy (HCC)    Managed with sublingual vitamin supplement.   Lab Results  Component Value Date   VITAMINB12 1,070 (H) 11/12/2016         RESOLVED: Dizziness   Relevant Orders   CBC with Differential/Platelet   Impaired fasting glucose    A1c is normal.  Low GI diet and regular exercise recommended   Lab Results  Component Value Date   HGBA1C 5.7 05/17/2017         Relevant Orders   Hemoglobin A1c    Other Visit Diagnoses    Vitamin B12 deficiency       Relevant Orders   Vitamin B12     A total of 40 minutes of face to face time was spent with patient more than half of which was spent in counselling and coordination of care   I am having Ikeya Brockel. Neva Seat start on ALPRAZolam. I am also having her maintain her cholecalciferol, estradiol, vitamin E, aspirin, Docusate Calcium (STOOL SOFTENER PO), PREVIDENT 5000 DRY MOUTH, Cyanocobalamin, amLODipine, atorvastatin, losartan, fluticasone, and buPROPion. We will continue to administer methylPREDNISolone acetate.  Meds ordered  this encounter  Medications  . ALPRAZolam (XANAX) 0.25 MG tablet    Sig: Take 1 tablet (0.25 mg total) by mouth at bedtime as needed for anxiety.    Dispense:  30 tablet    Refill:  0  . DISCONTD: Zoster Vaccine Adjuvanted (SHINGRIX) injection    Sig: Inject 0.5 mLs into the muscle once for 1 dose.    Dispense:  1 each    Refill:  1  . Zoster Vaccine Adjuvanted [facility]) injection    Sig: Inject 0.5 mLs into the muscle once for 1 dose.    Dispense:  1 each    Refill:  1    Medications Discontinued During This Encounter  Medication Reason  . Zoster Vaccine Adjuvanted [facility]) injection Reorder    Follow-up: Return in about 6 months (around 11/21/2017) for cpe.   Sherlene Shams, MD

## 2017-05-26 NOTE — Assessment & Plan Note (Signed)
A1c is normal.  Low GI diet and regular exercise recommended   Lab Results  Component Value Date   HGBA1C 5.7 05/17/2017

## 2017-05-26 NOTE — Assessment & Plan Note (Signed)
Managed with sublingual vitamin supplement.   Lab Results  Component Value Date   VITAMINB12 1,070 (H) 11/12/2016

## 2017-05-27 ENCOUNTER — Other Ambulatory Visit: Payer: Self-pay | Admitting: Internal Medicine

## 2017-06-06 ENCOUNTER — Other Ambulatory Visit: Payer: Self-pay | Admitting: Internal Medicine

## 2017-07-26 ENCOUNTER — Other Ambulatory Visit: Payer: Self-pay | Admitting: Internal Medicine

## 2017-08-13 LAB — HM MAMMOGRAPHY

## 2017-08-16 ENCOUNTER — Telehealth: Payer: Self-pay | Admitting: Internal Medicine

## 2017-08-16 ENCOUNTER — Other Ambulatory Visit: Payer: Self-pay | Admitting: *Deleted

## 2017-08-16 MED ORDER — FLUTICASONE PROPIONATE 50 MCG/ACT NA SUSP: NASAL | 2 refills | Status: DC

## 2017-08-16 NOTE — Telephone Encounter (Signed)
Copied from CRM [id number]. Topic: Quick Communication - Rx Refill/Question >> Aug 16, 2017  9:24 AM Landry MellowFoltz, Melissa J wrote: Medication: fluticasone (FLONASE) 50 MCG/ACT nasal spray Has the patient contacted their pharmacy? Yes.   (Agent: If no, request that the patient contact the pharmacy for the refill.) Preferred Pharmacy (with phone number or street name): [facility] - [phone] Agent: Please be advised that RX refills may take up to 3 business days. We ask that you follow-up with your pharmacy.   Pharm called

## 2017-08-16 NOTE — Telephone Encounter (Signed)
Rx refilled per protocol: LOV: 05/24/17 

## 2017-08-26 ENCOUNTER — Encounter: Payer: Self-pay | Admitting: Internal Medicine

## 2017-08-28 ENCOUNTER — Other Ambulatory Visit: Payer: Self-pay | Admitting: Internal Medicine

## 2017-08-29 ENCOUNTER — Other Ambulatory Visit: Payer: Self-pay | Admitting: Internal Medicine

## 2017-08-30 MED ORDER — LOSARTAN POTASSIUM 100 MG PO TABS: 100.0000 mg | ORAL_TABLET | Freq: Every day | ORAL | 1 refills | Status: DC

## 2017-10-13 ENCOUNTER — Other Ambulatory Visit: Payer: Self-pay | Admitting: Internal Medicine

## 2017-10-13 DIAGNOSIS — E785 Hyperlipidemia, unspecified: Secondary | ICD-10-CM

## 2017-11-15 ENCOUNTER — Other Ambulatory Visit (INDEPENDENT_AMBULATORY_CARE_PROVIDER_SITE_OTHER): Payer: Medicare Other

## 2017-11-15 DIAGNOSIS — E559 Vitamin D deficiency, unspecified: Secondary | ICD-10-CM | POA: Diagnosis not present

## 2017-11-15 DIAGNOSIS — E538 Deficiency of other specified B group vitamins: Secondary | ICD-10-CM

## 2017-11-15 DIAGNOSIS — R42 Dizziness and giddiness: Secondary | ICD-10-CM | POA: Diagnosis not present

## 2017-11-15 DIAGNOSIS — R7301 Impaired fasting glucose: Secondary | ICD-10-CM

## 2017-11-15 DIAGNOSIS — I10 Essential (primary) hypertension: Secondary | ICD-10-CM | POA: Diagnosis not present

## 2017-11-15 DIAGNOSIS — E782 Mixed hyperlipidemia: Secondary | ICD-10-CM

## 2017-11-15 LAB — CBC WITH DIFFERENTIAL/PLATELET
BASOS PCT: 1.1 % (ref 0.0–3.0)
Basophils Absolute: 0.1 10*3/uL (ref 0.0–0.1)
EOS PCT: 3.1 % (ref 0.0–5.0)
Eosinophils Absolute: 0.2 10*3/uL (ref 0.0–0.7)
HEMATOCRIT: 40.3 % (ref 36.0–46.0)
HEMOGLOBIN: 14.1 g/dL (ref 12.0–15.0)
Lymphocytes Relative: 24.8 % (ref 12.0–46.0)
Lymphs Abs: 1.4 10*3/uL (ref 0.7–4.0)
MCHC: 35 g/dL (ref 30.0–36.0)
MCV: 95.5 fl (ref 78.0–100.0)
MONO ABS: 0.5 10*3/uL (ref 0.1–1.0)
Monocytes Relative: 8.6 % (ref 3.0–12.0)
NEUTROS ABS: 3.4 10*3/uL (ref 1.4–7.7)
Neutrophils Relative %: 62.4 % (ref 43.0–77.0)
Platelets: 242 10*3/uL (ref 150.0–400.0)
RBC: 4.23 Mil/uL (ref 3.87–5.11)
RDW: 12.4 % (ref 11.5–15.5)
WBC: 5.5 10*3/uL (ref 4.0–10.5)

## 2017-11-15 LAB — LIPID PANEL
Cholesterol: 146 mg/dL (ref 0–200)
HDL: 63.1 mg/dL (ref >39.00)
LDL Cholesterol: 65 mg/dL (ref 0–99)
NonHDL: 83.2
Total CHOL/HDL Ratio: 2
Triglycerides: 89 mg/dL (ref 0.0–149.0)
VLDL: 17.8 mg/dL (ref 0.0–40.0)

## 2017-11-15 LAB — COMPREHENSIVE METABOLIC PANEL WITH GFR
ALT: 19 U/L (ref 0–35)
AST: 14 U/L (ref 0–37)
Albumin: 3.9 g/dL (ref 3.5–5.2)
Alkaline Phosphatase: 60 U/L (ref 39–117)
BUN: 12 mg/dL (ref 6–23)
CO2: 30 meq/L (ref 19–32)
Calcium: 8.9 mg/dL (ref 8.4–10.5)
Chloride: 103 meq/L (ref 96–112)
Creatinine, Ser: 0.81 mg/dL (ref 0.40–1.20)
GFR: 74.78 mL/min (ref >60.00)
Glucose, Bld: 117 mg/dL — ABNORMAL HIGH (ref 70–99)
Potassium: 3.7 meq/L (ref 3.5–5.1)
Sodium: 139 meq/L (ref 135–145)
Total Bilirubin: 0.4 mg/dL (ref 0.2–1.2)
Total Protein: 6.5 g/dL (ref 6.0–8.3)

## 2017-11-15 LAB — MICROALBUMIN / CREATININE URINE RATIO
Creatinine,U: 128.6 mg/dL
Microalb Creat Ratio: 0.5 mg/g (ref 0.0–30.0)
Microalb, Ur: <0.7 mg/dL (ref 0.0–1.9)

## 2017-11-15 LAB — VITAMIN D 25 HYDROXY (VIT D DEFICIENCY, FRACTURES): VITD: 34.2 ng/mL (ref 30.00–100.00)

## 2017-11-15 LAB — VITAMIN B12: Vitamin B-12: >1500 pg/mL — ABNORMAL HIGH (ref 211–911)

## 2017-11-15 LAB — HEMOGLOBIN A1C: Hgb A1c MFr Bld: 5.8 % (ref 4.6–6.5)

## 2017-11-22 ENCOUNTER — Ambulatory Visit (INDEPENDENT_AMBULATORY_CARE_PROVIDER_SITE_OTHER): Payer: Medicare Other | Admitting: Internal Medicine

## 2017-11-22 ENCOUNTER — Encounter: Payer: Self-pay | Admitting: Internal Medicine

## 2017-11-22 VITALS — BP 140/78 | HR 77 | Temp 98.4°F | Resp 14 | Ht 64.0 in | Wt 163.8 lb

## 2017-11-22 DIAGNOSIS — R7301 Impaired fasting glucose: Secondary | ICD-10-CM | POA: Diagnosis not present

## 2017-11-22 DIAGNOSIS — I10 Essential (primary) hypertension: Secondary | ICD-10-CM

## 2017-11-22 DIAGNOSIS — Z0000 Encounter for general adult medical examination without abnormal findings: Secondary | ICD-10-CM

## 2017-11-22 DIAGNOSIS — E782 Mixed hyperlipidemia: Secondary | ICD-10-CM

## 2017-11-22 MED ORDER — ALPRAZOLAM 0.25 MG PO TABS: 0.2500 mg | ORAL_TABLET | Freq: Every evening | ORAL | 3 refills | Status: DC | PRN

## 2017-11-22 MED ORDER — BUPROPION HCL ER (SR) 100 MG PO TB12: 100.0000 mg | ORAL_TABLET | Freq: Two times a day (BID) | ORAL | 3 refills | Status: DC

## 2017-11-22 NOTE — Patient Instructions (Signed)
  To make a low carb chip :  Take the Joseph's Lavash or Pita bread,  Or the [place] Low carb whole wheat tortilla   Place on metal cookie sheet  Brush with olive oil  Sprinkle garlic powder (NOT garlic salt), grated parmesan cheese, mediterranean seasoning , or all of them?  Bake at 275 for 30 minutes   We have substitutions for your potatoes!!  Try the mashed cauliflower and riced cauliflower dishes instead of rice and mashed potatoes  Mashed turnips are also very low carb!   For desserts :  Try the Dannon Lt n Fit greek yogurt dessert flavors and top with reddi Whip .  8 carbs,  80 calories  Try Oikos Triple Zero Greek Yogurt in the salted caramel, and the coffee flavors  With Whipped Cream for dessert  breyer's low carb ice cream, available in bars (on a stick, better ) or scoopable ice cream  HERE ARE THE LOW CARB  BREAD CHOICES        

## 2017-11-22 NOTE — Progress Notes (Signed)
Patient ID: Shelley Zuniga, female    DOB: Jan 21, 1951  Age: 67 y.o. MRN: [id number]  The patient is here for annual Medicare wellness examination and management of other chronic and acute problems.  GYN exam done in march at [facility]  FIT test sent to home by insurance  cologuard was negative 2018 mammogram normal [facility] DXA normal Jan 2018.   The risk factors are reflected in the social history.  The roster of all physicians providing medical care to patient - is listed in the Snapshot section of the chart.  Activities of daily living:  The patient is 100% independent in all ADLs: dressing, toileting, feeding as well as independent mobility  Home safety : The patient has smoke detectors in the home. They wear seatbelts.  There are no firearms at home. There is no violence in the home.   There is no risks for hepatitis, STDs or HIV. There is no   history of blood transfusion. They have no travel history to infectious disease endemic areas of the world.  The patient has seen their dentist in the last six month. They have seen their eye doctor in the last year. They admit to slight hearing difficulty with regard to whispered voices and some television programs.  They have deferred audiologic testing in the last year.  They do not  have excessive sun exposure. Discussed the need for sun protection: hats, long sleeves and use of sunscreen if there is significant sun exposure.   Diet: the importance of a healthy diet is discussed. They do have a healthy diet.  The benefits of regular aerobic exercise were discussed. She walks 4 times per week ,  20 minutes.   Depression screen: there are no signs or vegative symptoms of depression- irritability, change in appetite, anhedonia, sadness/tearfullness.  Cognitive assessment: the patient manages all their financial and personal affairs and is actively engaged. They could relate day,date,year and events; recalled 2/3 objects at 3 minutes; performed  clock-face test normally.  The following portions of the patient's history were reviewed and updated as appropriate: allergies, current medications, past family history, past medical history,  past surgical history, past social history  and problem list.  Visual acuity was not assessed per patient preference since she has regular follow up with her ophthalmologist. Hearing and body mass index were assessed and reviewed.   During the course of the visit the patient was educated and counseled about appropriate screening and preventive services including : fall prevention , diabetes screening, nutrition counseling, colorectal cancer screening, and recommended immunizations.    CC: Diagnoses of Mixed hyperlipidemia, Essential hypertension, Impaired fasting glucose, and Routine general medical examination at a health care facility were pertinent to this visit.  follow up on hypertension, hyperlipidemia and depression  OAB: Started taking myrbetriq 50 ,g for the past week this is her 3rd trial (trospium made her violently ill   And tolteradine did not work )    After having a urolgy eval for incontinence. Trying to avoid surgery for  Prolapsed perineum.  Trying to use a pessary ,  size has been progressively upgraded.  currently using size #8 and cranberry for OAB and recurrent UTI,  UTIs less frequent,  Still having incontinence.   Marland Kitchen    History Alaiyah has a past medical history of Colon polyp (2001) and Strabismus (age 57).   She has a past surgical history that includes Hemorrhoid surgery; Gallbladder surgery; Eye surgery (1956); Bilateral oophorectomy (2000); Abdominal hysterectomy (2000); and Excision  basal cell carcinoma (Left, 2013).   Her family history includes Cancer in her brother, father, maternal grandmother, and mother.She reports that she has never smoked. She has never used smokeless tobacco. She reports that she does not drink alcohol or use drugs.  Outpatient Medications Prior to Visit   Medication Sig Dispense Refill  . amLODipine (NORVASC) 5 MG tablet take 1 tablet by mouth once daily 90 tablet 3  . aspirin 81 MG tablet Take 81 mg by mouth daily.      Marland Kitchen atorvastatin (LIPITOR) 20 MG tablet TAKE 1 TABLET BY MOUTH ONCE DAILY 90 tablet 1  . cholecalciferol (VITAMIN D) 1000 UNITS tablet Take 1,000 Units by mouth daily.     . Cranberry 500 MG CAPS     . Docusate Calcium (STOOL SOFTENER PO) Take by mouth.      . estradiol (ESTRACE) 1 MG tablet Take 1 mg by mouth daily.      . fluticasone (FLONASE) 50 MCG/ACT nasal spray instill 2 sprays into each nostril once daily 16 g 2  . losartan (COZAAR) 100 MG tablet Take 1 tablet (100 mg total) by mouth daily. 90 tablet 1  . mirabegron ER (MYRBETRIQ) 50 MG TB24 tablet Take by mouth.    Marland Kitchen PREVIDENT 5000 DRY MOUTH 1.1 % GEL dental gel     . vitamin B-12 (CYANOCOBALAMIN) 1000 MCG tablet Take 1,000 mcg by mouth daily.    . vitamin E 400 UNIT capsule Take 400 Units by mouth daily.      Marland Kitchen ALPRAZolam (XANAX) 0.25 MG tablet Take 1 tablet (0.25 mg total) by mouth at bedtime as needed for anxiety. 30 tablet 0  . buPROPion (WELLBUTRIN SR) 100 MG 12 hr tablet TAKE 1 TABLET BY MOUTH TWICE A DAY 180 tablet 1  . Cyanocobalamin 1000 MCG SUBL Place 1 tablet (1,000 mcg total) under the tongue daily. (Patient not taking: Reported on 11/22/2017) 90 tablet 3   Facility-Administered Medications Prior to Visit  Medication Dose Route Frequency Provider Last Rate Last Dose  . methylPREDNISolone acetate (DEPO-MEDROL) injection 40 mg  40 mg Intramuscular Once Sherlene Shams, MD        Review of Systems   Patient denies headache, fevers, malaise, unintentional weight loss, skin rash, eye pain, sinus congestion and sinus pain, sore throat, dysphagia,  hemoptysis , cough, dyspnea, wheezing, chest pain, palpitations, orthopnea, edema, abdominal pain, nausea, melena, diarrhea, constipation, flank pain, dysuria, hematuria, urinary  Frequency, nocturia, numbness,  tingling, seizures,  Focal weakness, Loss of consciousness,  Tremor, insomnia, depression, anxiety, and suicidal ideation.      Objective:  BP 140/78 (BP Location: Left Arm, Patient Position: Sitting, Cuff Size: Normal)   Pulse 77   Temp 98.4 F (36.9 C) (Oral)   Resp 14   Ht 5\' 4"  (1.626 m)   Wt 163 lb 12.8 oz (74.3 kg)   SpO2 98%   BMI 28.12 kg/m   Physical Exam   General appearance: alert, cooperative and appears stated age Head: Normocephalic, without obvious abnormality, atraumatic Eyes: conjunctivae/corneas clear. PERRL, EOM's intact. Fundi benign. Ears: normal TM's and external ear canals both ears Nose: Nares normal. Septum midline. Mucosa normal. No drainage or sinus tenderness. Throat: lips, mucosa, and tongue normal; teeth and gums normal Neck: no adenopathy, no carotid bruit, no JVD, supple, symmetrical, trachea midline and thyroid not enlarged, symmetric, no tenderness/mass/nodules Lungs: clear to auscultation bilaterally Breasts: normal appearance, no masses or tenderness Heart: regular rate and rhythm, S1, S2 normal, no murmur,  click, rub or gallop Abdomen: soft, non-tender; bowel sounds normal; no masses,  no organomegaly Extremities: extremities normal, atraumatic, no cyanosis or edema Pulses: 2+ and symmetric Skin: Skin color, texture, turgor normal. No rashes or lesions Neurologic: Alert and oriented X 3, normal strength and tone. Normal symmetric reflexes. Normal coordination and gait.      Assessment & Plan:   Problem List Items Addressed This Visit    Hypertension    Well controlled on current regimen. Renal function stable, no changes today.  Lab Results  Component Value Date   CREATININE 0.81 11/15/2017   Lab Results  Component Value Date   NA 139 11/15/2017   K 3.7 11/15/2017   CL 103 11/15/2017   CO2 30 11/15/2017         Impaired fasting glucose    A1c is now prediabetic range .  Low GI diet and regular exercise recommended   Lab  Results  Component Value Date   HGBA1C 5.8 11/15/2017         Mixed hyperlipidemia    LDL and triglycerides are at goal on current medications. She has no side effects and liver enzymes are normal. No changes today.    Lab Results  Component Value Date   CHOL 146 11/15/2017   HDL 63.10 11/15/2017   LDLCALC 65 11/15/2017   LDLDIRECT 72.0 05/15/2016   TRIG 89.0 11/15/2017   CHOLHDL 2 11/15/2017   Lab Results  Component Value Date   ALT 19 11/15/2017   AST 14 11/15/2017   ALKPHOS 60 11/15/2017   BILITOT 0.4 11/15/2017          Routine general medical examination at a health care facility    Annual comprehensive preventive exam was done as well as an evaluation and management of chronic conditions .  During the course of the visit the patient was educated and counseled about appropriate screening and preventive services including :  diabetes screening, lipid analysis with projected  10 year  risk for CAD , nutrition counseling, breast, cervical and colorectal cancer screening, and recommended immunizations.  Printed recommendations for health maintenance screenings was given         I have discontinued Pierce CraneKaren K. Azeez's Cyanocobalamin. I have also changed her buPROPion. Additionally, I am having her maintain her cholecalciferol, estradiol, vitamin E, aspirin, Docusate Calcium (STOOL SOFTENER PO), PREVIDENT 5000 DRY MOUTH, amLODipine, fluticasone, losartan, atorvastatin, Cranberry, mirabegron ER, vitamin B-12, and ALPRAZolam. We will continue to administer methylPREDNISolone acetate.  Meds ordered this encounter  Medications  . ALPRAZolam (XANAX) 0.25 MG tablet    Sig: Take 1 tablet (0.25 mg total) by mouth at bedtime as needed for anxiety.    Dispense:  30 tablet    Refill:  3  . buPROPion (WELLBUTRIN SR) 100 MG 12 hr tablet    Sig: Take 1 tablet (100 mg total) by mouth 2 (two) times daily.    Dispense:  180 tablet    Refill:  3    **Patient requests 90 days supply**     Medications Discontinued During This Encounter  Medication Reason  . Cyanocobalamin 1000 MCG SUBL Patient has not taken in last 30 days  . ALPRAZolam (XANAX) 0.25 MG tablet Reorder  . buPROPion (WELLBUTRIN SR) 100 MG 12 hr tablet Reorder    Follow-up: Return in about 6 months (around 05/25/2018) for follow up htn,  hyperlipidemia prediabetes.   Sherlene Shamseresa L Jock Mahon, MD

## 2017-11-23 NOTE — Assessment & Plan Note (Signed)
LDL and triglycerides are at goal on current medications. She has no side effects and liver enzymes are normal. No changes today.    Lab Results  Component Value Date   CHOL 146 11/15/2017   HDL 63.10 11/15/2017   LDLCALC 65 11/15/2017   LDLDIRECT 72.0 05/15/2016   TRIG 89.0 11/15/2017   CHOLHDL 2 11/15/2017   Lab Results  Component Value Date   ALT 19 11/15/2017   AST 14 11/15/2017   ALKPHOS 60 11/15/2017   BILITOT 0.4 11/15/2017

## 2017-11-23 NOTE — Assessment & Plan Note (Signed)
Well controlled on current regimen. Renal function stable, no changes today.  Lab Results  Component Value Date   CREATININE 0.81 11/15/2017   Lab Results  Component Value Date   NA 139 11/15/2017   K 3.7 11/15/2017   CL 103 11/15/2017   CO2 30 11/15/2017

## 2017-11-23 NOTE — Assessment & Plan Note (Signed)
A1c is now prediabetic range .  Low GI diet and regular exercise recommended   Lab Results  Component Value Date   HGBA1C 5.8 11/15/2017

## 2017-11-23 NOTE — Assessment & Plan Note (Signed)
Annual comprehensive preventive exam was done as well as an evaluation and management of chronic conditions .  During the course of the visit the patient was educated and counseled about appropriate screening and preventive services including :  diabetes screening, lipid analysis with projected  10 year  risk for CAD , nutrition counseling, breast, cervical and colorectal cancer screening, and recommended immunizations.  Printed recommendations for health maintenance screenings was given 

## 2017-12-06 ENCOUNTER — Other Ambulatory Visit: Payer: Self-pay | Admitting: Internal Medicine

## 2018-01-30 ENCOUNTER — Other Ambulatory Visit: Payer: Self-pay | Admitting: Internal Medicine

## 2018-02-20 ENCOUNTER — Other Ambulatory Visit: Payer: Self-pay | Admitting: Internal Medicine

## 2018-02-25 MED ORDER — LOSARTAN POTASSIUM 100 MG PO TABS: 100.0000 mg | ORAL_TABLET | Freq: Every day | ORAL | 1 refills | Status: DC

## 2018-02-25 NOTE — Telephone Encounter (Signed)
Pt states she is completely out of medication and needs this called in.

## 2018-02-25 NOTE — Addendum Note (Signed)
Addended by: Dennie Bible on: 02/25/2018 03:17 PM   Modules accepted: Orders

## 2018-02-25 NOTE — Telephone Encounter (Signed)
Refilled and patient notified 

## 2018-03-09 ENCOUNTER — Other Ambulatory Visit: Payer: Self-pay | Admitting: Internal Medicine

## 2018-04-17 ENCOUNTER — Other Ambulatory Visit: Payer: Self-pay | Admitting: Internal Medicine

## 2018-04-17 DIAGNOSIS — E785 Hyperlipidemia, unspecified: Secondary | ICD-10-CM

## 2018-04-18 DIAGNOSIS — E785 Hyperlipidemia, unspecified: Secondary | ICD-10-CM

## 2018-04-18 MED ORDER — ATORVASTATIN CALCIUM 20 MG PO TABS: 20.0000 mg | ORAL_TABLET | Freq: Every day | ORAL | 0 refills | Status: DC

## 2018-05-11 ENCOUNTER — Other Ambulatory Visit: Payer: Self-pay | Admitting: Internal Medicine

## 2018-05-30 ENCOUNTER — Ambulatory Visit: Payer: Medicare Other | Admitting: Internal Medicine

## 2018-05-30 ENCOUNTER — Encounter: Payer: Self-pay | Admitting: Internal Medicine

## 2018-05-30 VITALS — BP 122/76 | HR 76 | Temp 98.6°F | Resp 14 | Ht 64.0 in | Wt 165.4 lb

## 2018-05-30 DIAGNOSIS — R5383 Other fatigue: Secondary | ICD-10-CM | POA: Diagnosis not present

## 2018-05-30 DIAGNOSIS — E538 Deficiency of other specified B group vitamins: Secondary | ICD-10-CM | POA: Diagnosis not present

## 2018-05-30 DIAGNOSIS — G63 Polyneuropathy in diseases classified elsewhere: Secondary | ICD-10-CM

## 2018-05-30 DIAGNOSIS — I10 Essential (primary) hypertension: Secondary | ICD-10-CM | POA: Diagnosis not present

## 2018-05-30 DIAGNOSIS — E559 Vitamin D deficiency, unspecified: Secondary | ICD-10-CM | POA: Diagnosis not present

## 2018-05-30 DIAGNOSIS — Z23 Encounter for immunization: Secondary | ICD-10-CM

## 2018-05-30 DIAGNOSIS — R7301 Impaired fasting glucose: Secondary | ICD-10-CM | POA: Diagnosis not present

## 2018-05-30 DIAGNOSIS — E782 Mixed hyperlipidemia: Secondary | ICD-10-CM

## 2018-05-30 LAB — CBC WITH DIFFERENTIAL/PLATELET
BASOS ABS: 0.1 10*3/uL (ref 0.0–0.1)
Basophils Relative: 1.1 % (ref 0.0–3.0)
Eosinophils Absolute: 0.1 10*3/uL (ref 0.0–0.7)
Eosinophils Relative: 2.4 % (ref 0.0–5.0)
HCT: 42.7 % (ref 36.0–46.0)
Hemoglobin: 14.7 g/dL (ref 12.0–15.0)
LYMPHS ABS: 1.4 10*3/uL (ref 0.7–4.0)
Lymphocytes Relative: 24.1 % (ref 12.0–46.0)
MCHC: 34.5 g/dL (ref 30.0–36.0)
MCV: 95.9 fl (ref 78.0–100.0)
MONO ABS: 0.5 10*3/uL (ref 0.1–1.0)
Monocytes Relative: 9.3 % (ref 3.0–12.0)
NEUTROS PCT: 63.1 % (ref 43.0–77.0)
Neutro Abs: 3.7 10*3/uL (ref 1.4–7.7)
Platelets: 265 10*3/uL (ref 150.0–400.0)
RBC: 4.45 Mil/uL (ref 3.87–5.11)
RDW: 12.5 % (ref 11.5–15.5)
WBC: 5.9 10*3/uL (ref 4.0–10.5)

## 2018-05-30 LAB — COMPREHENSIVE METABOLIC PANEL
ALBUMIN: 4 g/dL (ref 3.5–5.2)
ALK PHOS: 59 U/L (ref 39–117)
ALT: 16 U/L (ref 0–35)
AST: 13 U/L (ref 0–37)
BILIRUBIN TOTAL: 0.6 mg/dL (ref 0.2–1.2)
BUN: 14 mg/dL (ref 6–23)
CO2: 28 mEq/L (ref 19–32)
Calcium: 9.2 mg/dL (ref 8.4–10.5)
Chloride: 103 mEq/L (ref 96–112)
Creatinine, Ser: 0.79 mg/dL (ref 0.40–1.20)
GFR: 76.85 mL/min (ref >60.00)
Glucose, Bld: 109 mg/dL — ABNORMAL HIGH (ref 70–99)
Potassium: 4.2 mEq/L (ref 3.5–5.1)
SODIUM: 138 meq/L (ref 135–145)
TOTAL PROTEIN: 7 g/dL (ref 6.0–8.3)

## 2018-05-30 LAB — MICROALBUMIN / CREATININE URINE RATIO
Creatinine,U: 99.2 mg/dL
Microalb Creat Ratio: 0.7 mg/g (ref 0.0–30.0)

## 2018-05-30 LAB — LIPID PANEL
CHOLESTEROL: 159 mg/dL (ref 0–200)
HDL: 74.3 mg/dL (ref >39.00)
LDL Cholesterol: 63 mg/dL (ref 0–99)
NonHDL: 84.65
Total CHOL/HDL Ratio: 2
Triglycerides: 110 mg/dL (ref 0.0–149.0)
VLDL: 22 mg/dL (ref 0.0–40.0)

## 2018-05-30 LAB — B12 AND FOLATE PANEL
Folate: 13.6 ng/mL (ref >5.9)
Vitamin B-12: 362 pg/mL (ref 211–911)

## 2018-05-30 LAB — HEMOGLOBIN A1C: Hgb A1c MFr Bld: 5.7 % (ref 4.6–6.5)

## 2018-05-30 LAB — TSH: TSH: 0.91 u[IU]/mL (ref 0.35–4.50)

## 2018-05-30 NOTE — Progress Notes (Signed)
Subjective:  Patient ID: Shelley Zuniga, female    DOB: August 22, 1950  Age: 68 y.o. MRN: [id number]  CC: The primary encounter diagnosis was Other fatigue. Diagnoses of Encounter for immunization, Vitamin D deficiency, Essential hypertension, Impaired fasting glucose, Mixed hyperlipidemia, and B12 neuropathy (HCC) were also pertinent to this visit.  HPI Shelley Zuniga presents for 6 month follow up on hypertension,  hyperlipidenia and IPG  Hypertension: patient checks blood pressure twice weekly at home.  Readings have been for the most part < 140/80 at rest . Patient is following a reduced salt diet most days and is taking medications as prescribed  Persistent fatigue occurring every afternoon.   Not sleepy unless watches TV.  NOCTURIA.  Told she snores. Sleep [facility] done several years ago [facility]  Large tonsils    [facility] [place] [place]ary, did not tolerate trial despite multiple multiple trials of different sizes.  Bladder issues  followed by pelvic PT helped the most.     NO LONGER TAKING B12   Outpatient Medications Prior to Visit  Medication Sig Dispense Refill  . ALPRAZolam (XANAX) 0.25 MG tablet Take 1 tablet (0.25 mg total) by mouth at bedtime as needed for anxiety. 30 tablet 3  . amLODipine (NORVASC) 5 MG tablet take 1 tablet by mouth once daily 90 tablet 3  . aspirin 81 MG tablet Take 81 mg by mouth daily.      Marland Kitchen. atorvastatin (LIPITOR) 20 MG tablet Take 1 tablet (20 mg total) by mouth daily. 90 tablet 0  . buPROPion (WELLBUTRIN SR) 100 MG 12 hr tablet Take 1 tablet (100 mg total) by mouth 2 (two) times daily. 180 tablet 3  . Cranberry 500 MG CAPS     . Docusate Calcium (STOOL SOFTENER PO) Take by mouth.      . estradiol (ESTRACE) 1 MG tablet Take 1 mg by mouth daily.      . fluticasone (FLONASE) 50 MCG/ACT nasal spray SHAKE LIQUID AND USE 2 SPRAYS IN EACH NOSTRIL EVERY DAY 16 g 1  . losartan (COZAAR) 100 MG tablet Take 1 tablet (100 mg total) by mouth daily. 90 tablet 1  .  mirabegron ER (MYRBETRIQ) 50 MG TB24 tablet Take by mouth.    Marland Kitchen. PREVIDENT 5000 DRY MOUTH 1.1 % GEL dental gel     . vitamin E 400 UNIT capsule Take 400 Units by mouth daily.      . cholecalciferol (VITAMIN D) 1000 UNITS tablet Take 1,000 Units by mouth daily.     . vitamin B-12 (CYANOCOBALAMIN) 1000 MCG tablet Take 1,000 mcg by mouth daily.     Facility-Administered Medications Prior to Visit  Medication Dose Route Frequency Provider Last Rate Last Dose  . methylPREDNISolone acetate (DEPO-MEDROL) injection 40 mg  40 mg Intramuscular Once Sherlene Shamsullo, Tyneshia Stivers L, MD        Review of Systems;  Patient denies headache, fevers, malaise, unintentional weight loss, skin rash, eye pain, sinus congestion and sinus pain, sore throat, dysphagia,  hemoptysis , cough, dyspnea, wheezing, chest pain, palpitations, orthopnea, edema, abdominal pain, nausea, melena, diarrhea, constipation, flank pain, dysuria, hematuria, urinary  Frequency, nocturia, numbness, tingling, seizures,  Focal weakness, Loss of consciousness,  Tremor, insomnia, depression, anxiety, and suicidal ideation.      Objective:  BP 122/76 (BP Location: Left Arm, Patient Position: Sitting, Cuff Size: Normal)   Pulse 76   Temp 98.6 F (37 C) (Oral)   Resp 14   Ht 5\' 4"  (1.626 m)  Wt 165 lb 6.4 oz (75 kg)   SpO2 97%   BMI 28.39 kg/m   BP Readings from Last 3 Encounters:  05/30/18 122/76  11/22/17 140/78  05/24/17 140/78    Wt Readings from Last 3 Encounters:  05/30/18 165 lb 6.4 oz (75 kg)  11/22/17 163 lb 12.8 oz (74.3 kg)  05/24/17 161 lb 6.4 oz (73.2 kg)    General appearance: alert, cooperative and appears stated age Ears: normal TM's and external ear canals both ears Throat: lips, mucosa, and tongue normal; teeth and gums normal Neck: no adenopathy, no carotid bruit, supple, symmetrical, trachea midline and thyroid not enlarged, symmetric, no tenderness/mass/nodules Back: symmetric, no curvature. ROM normal. No CVA  tenderness. Lungs: clear to auscultation bilaterally Heart: regular rate and rhythm, S1, S2 normal, no murmur, click, rub or gallop Abdomen: soft, non-tender; bowel sounds normal; no masses,  no organomegaly Pulses: 2+ and symmetric Skin: Skin color, texture, turgor normal. No rashes or lesions Lymph nodes: Cervical, supraclavicular, and axillary nodes normal.  Lab Results  Component Value Date   HGBA1C 5.7 05/30/2018   HGBA1C 5.8 11/15/2017   HGBA1C 5.7 05/17/2017    Lab Results  Component Value Date   CREATININE 0.79 05/30/2018   CREATININE 0.81 11/15/2017   CREATININE 0.79 05/17/2017    Lab Results  Component Value Date   WBC 5.9 05/30/2018   HGB 14.7 05/30/2018   HCT 42.7 05/30/2018   PLT 265.0 05/30/2018   GLUCOSE 109 (H) 05/30/2018   CHOL 159 05/30/2018   TRIG 110.0 05/30/2018   HDL 74.30 05/30/2018   LDLDIRECT 72.0 05/15/2016   LDLCALC 63 05/30/2018   ALT 16 05/30/2018   AST 13 05/30/2018   NA 138 05/30/2018   K 4.2 05/30/2018   CL 103 05/30/2018   CREATININE 0.79 05/30/2018   BUN 14 05/30/2018   CO2 28 05/30/2018   TSH 0.91 05/30/2018   HGBA1C 5.7 05/30/2018   MICROALBUR <0.7 05/30/2018    Dg Knee Complete 4 Views Left  Result Date: 10/19/2013 CLINICAL DATA:  Larey SeatFell 1 month ago with pain EXAM: LEFT KNEE - COMPLETE 4+ VIEW COMPARISON:  None. FINDINGS: The left knee joint spaces appear normal. No fracture is seen. No effusion is noted. Calcification is noted in the soft tissues of the left knee medially just below the level of the knee joint most likely vascular in origin. IMPRESSION: Negative. Electronically Signed   By: Dwyane DeePaul  Barry M.D.   On: 10/19/2013 13:07    Assessment & Plan:   Problem List Items Addressed This Visit    B12 neuropathy (HCC)    Managed with sublingual vitamin supplement. She has stopped taking supplements and her level remains normal   Lab Results  Component Value Date   VITAMINB12 362 05/30/2018         Relevant Orders   B12  and Folate Panel (Completed)   Fatigue - Primary    With snoring reported. Screening labs normal.  Sleep study recommended.  Lab Results  Component Value Date   WBC 5.9 05/30/2018   HGB 14.7 05/30/2018   HCT 42.7 05/30/2018   MCV 95.9 05/30/2018   PLT 265.0 05/30/2018   Lab Results  Component Value Date   TSH 0.91 05/30/2018   Lab Results  Component Value Date   VITAMINB12 362 05/30/2018         Relevant Orders   TSH (Completed)   CBC with Differential/Platelet (Completed)   Hypertension    Well controlled on current  regimen. Renal function stable, no changes today.  Lab Results  Component Value Date   CREATININE 0.79 05/30/2018   Lab Results  Component Value Date   NA 138 05/30/2018   K 4.2 05/30/2018   CL 103 05/30/2018   CO2 28 05/30/2018         Relevant Orders   Comprehensive metabolic panel (Completed)   Microalbumin / creatinine urine ratio (Completed)   Impaired fasting glucose    A1c is now below the  prediabetic range .  Low GI diet and regular exercise recommended   Lab Results  Component Value Date   HGBA1C 5.7 05/30/2018         Relevant Orders   Microalbumin / creatinine urine ratio (Completed)   Hemoglobin A1c (Completed)   Mixed hyperlipidemia   Relevant Orders   Lipid panel (Completed)   Vitamin D deficiency    Other Visit Diagnoses    Encounter for immunization          I have discontinued Darrell Leonhardt. Leming's cholecalciferol and vitamin B-12. I am also having her maintain her estradiol, vitamin E, aspirin, Docusate Calcium (STOOL SOFTENER PO), PREVIDENT 5000 DRY MOUTH, amLODipine, Cranberry, mirabegron ER, ALPRAZolam, buPROPion, losartan, atorvastatin, and fluticasone. We will continue to administer methylPREDNISolone acetate.  No orders of the defined types were placed in this encounter.   Medications Discontinued During This Encounter  Medication Reason  . cholecalciferol (VITAMIN D) 1000 UNITS tablet Error  . vitamin B-12  (CYANOCOBALAMIN) 1000 MCG tablet Error    Follow-up: Return in about 6 months (around 11/28/2018) for CPE.   Sherlene Shams, MD

## 2018-05-30 NOTE — Patient Instructions (Signed)

## 2018-05-31 ENCOUNTER — Encounter: Payer: Self-pay | Admitting: Internal Medicine

## 2018-05-31 NOTE — Assessment & Plan Note (Signed)
A1c is now below the  prediabetic range .  Low GI diet and regular exercise recommended   Lab Results  Component Value Date   HGBA1C 5.7 05/30/2018

## 2018-05-31 NOTE — Assessment & Plan Note (Signed)
Managed with sublingual vitamin supplement. She has stopped taking supplements and her level remains normal   Lab Results  Component Value Date   VITAMINB12 362 05/30/2018

## 2018-05-31 NOTE — Assessment & Plan Note (Signed)
Well controlled on current regimen. Renal function stable, no changes today.  Lab Results  Component Value Date   CREATININE 0.79 05/30/2018   Lab Results  Component Value Date   NA 138 05/30/2018   K 4.2 05/30/2018   CL 103 05/30/2018   CO2 28 05/30/2018

## 2018-05-31 NOTE — Assessment & Plan Note (Signed)
With snoring reported. Screening labs normal.  Sleep study recommended.  Lab Results  Component Value Date   WBC 5.9 05/30/2018   HGB 14.7 05/30/2018   HCT 42.7 05/30/2018   MCV 95.9 05/30/2018   PLT 265.0 05/30/2018   Lab Results  Component Value Date   TSH 0.91 05/30/2018   Lab Results  Component Value Date   VITAMINB12 362 05/30/2018

## 2018-06-21 ENCOUNTER — Other Ambulatory Visit: Payer: Self-pay | Admitting: Internal Medicine

## 2018-08-16 ENCOUNTER — Other Ambulatory Visit: Payer: Self-pay | Admitting: Internal Medicine

## 2018-09-19 ENCOUNTER — Other Ambulatory Visit: Payer: Self-pay | Admitting: Internal Medicine

## 2018-09-19 DIAGNOSIS — E785 Hyperlipidemia, unspecified: Secondary | ICD-10-CM

## 2018-10-12 LAB — HM MAMMOGRAPHY

## 2018-11-06 ENCOUNTER — Other Ambulatory Visit: Payer: Self-pay | Admitting: Internal Medicine

## 2018-12-02 ENCOUNTER — Encounter: Payer: Medicare Other | Admitting: Internal Medicine

## 2018-12-08 ENCOUNTER — Other Ambulatory Visit: Payer: Self-pay

## 2018-12-08 ENCOUNTER — Ambulatory Visit (LOCAL_COMMUNITY_HEALTH_CENTER): Payer: Medicare Other

## 2018-12-08 DIAGNOSIS — Z23 Encounter for immunization: Secondary | ICD-10-CM | POA: Diagnosis not present

## 2018-12-08 NOTE — Progress Notes (Signed)
Pt stated she did not have any allergies, except to grasses and pollens.

## 2018-12-08 NOTE — Progress Notes (Signed)
Pt states she cut herself with a rusty wire, desired Td booster.

## 2018-12-18 ENCOUNTER — Other Ambulatory Visit: Payer: Self-pay | Admitting: Internal Medicine

## 2018-12-22 ENCOUNTER — Other Ambulatory Visit: Payer: Self-pay

## 2018-12-22 MED ORDER — LOSARTAN POTASSIUM 100 MG PO TABS: ORAL_TABLET | ORAL | 1 refills | Status: DC

## 2019-01-04 ENCOUNTER — Ambulatory Visit (INDEPENDENT_AMBULATORY_CARE_PROVIDER_SITE_OTHER): Payer: Medicare Other | Admitting: Internal Medicine

## 2019-01-04 ENCOUNTER — Other Ambulatory Visit: Payer: Self-pay

## 2019-01-04 ENCOUNTER — Encounter: Payer: Self-pay | Admitting: Internal Medicine

## 2019-01-04 DIAGNOSIS — G4700 Insomnia, unspecified: Secondary | ICD-10-CM

## 2019-01-04 DIAGNOSIS — M1990 Unspecified osteoarthritis, unspecified site: Secondary | ICD-10-CM | POA: Diagnosis not present

## 2019-01-04 DIAGNOSIS — R7301 Impaired fasting glucose: Secondary | ICD-10-CM

## 2019-01-04 DIAGNOSIS — E782 Mixed hyperlipidemia: Secondary | ICD-10-CM | POA: Diagnosis not present

## 2019-01-04 DIAGNOSIS — I10 Essential (primary) hypertension: Secondary | ICD-10-CM | POA: Diagnosis not present

## 2019-01-04 MED ORDER — TRAZODONE HCL 50 MG PO TABS: 25.0000 mg | ORAL_TABLET | Freq: Every evening | ORAL | 1 refills | Status: DC | PRN

## 2019-01-04 NOTE — Progress Notes (Signed)
Virtual Visit via [email or web address]  This visit type was conducted due to national recommendations for restrictions regarding the COVID-19 pandemic (e.g. social distancing).  This format is felt to be most appropriate for this patient at this time.  All issues noted in this document were discussed and addressed.  No physical exam was performed (except for noted visual exam findings with Video Visits).   I connected with@ on 01/04/19 at  9:30 AM EDT by a video enabled telemedicine application or telephone and verified that I am speaking with the correct person using two identifiers. Location patient: home Location provider: work or home office Persons participating in the virtual visit: patient, provider  I discussed the limitations, risks, security and privacy concerns of performing an evaluation and management service by telephone and the availability of in person appointments. I also discussed with the patient that there may be a patient responsible charge related to this service. The patient expressed understanding and agreed to proceed.  Reason for visit: follow up  HPI:  68 yr old female with hypertension , hyperlipidemia here for follow up  Hypertension: patient checks blood pressure twice weekly at home.  Readings have been for the most part < 140/80 at rest . Patient is following a reduce salt diet most days and is taking medications as prescribed  Patient  has been having some trouble sleeping.  Wakes up 2 to 3 times per night . Bedtime hygiene reviewed,  Patient has been using an electronic book to read before bed.  Does not drink caffeinated beverages after 3 PM.  Only voids  bladder once per night.  No snoring partner. Does not drink alcohol to excess.  Not exercising excessively in the evening. Patient does have a history of anxiety but does not lie awake worrying about issues that cannot be resolved. Does not take stimulants. .  The patient has no signs or symptoms of COVID 19 infection  (fever, cough, sore throat  or shortness of breath beyond what is typical for patient).  Patient denies contact with other persons with the above mentioned symptoms or with anyone confirmed to have COVID 19   ROS: See pertinent positives and negatives per HPI.  Past Medical History:  Diagnosis Date  . Colon polyp 2001   colonoscopy, [facility]   . Strabismus age 64   eye surgery    Past Surgical History:  Procedure Laterality Date  . ABDOMINAL HYSTERECTOMY  2000   [place], [facility], fibroids/menorrhagia  . BASAL CELL CARCINOMA EXCISION Left 2013   Dr. Koleen Nimrod  . BILATERAL OOPHORECTOMY  2000   fibroid cysts  . EYE SURGERY  1956   to correct strabismus  . GALLBLADDER SURGERY    . HEMORRHOID SURGERY      Family History  Problem Relation Age of Onset  . Cancer Mother        melanoma, squamous cell on nose  . Cancer Father        lung, tobacco user  . Cancer Brother        throat, tobacco user  . Cancer Maternal Grandmother        uterine/ovarian    SOCIAL HX:  reports that she has never smoked. She has never used smokeless tobacco. She reports that she does not drink alcohol or use drugs.   Current Outpatient Medications:  .  ALPRAZolam (XANAX) 0.25 MG tablet, Take 1 tablet (0.25 mg total) by mouth at bedtime as needed for anxiety., Disp: 30 tablet, Rfl: 3 .  amLODipine (NORVASC)  5 MG tablet, TAKE 1 TABLET BY MOUTH ONCE DAILY, Disp: 90 tablet, Rfl: 3 .  aspirin 81 MG tablet, Take 81 mg by mouth daily.  , Disp: , Rfl:  .  atorvastatin (LIPITOR) 20 MG tablet, TAKE 1 TABLET(20 MG) BY MOUTH DAILY, Disp: 90 tablet, Rfl: 1 .  buPROPion (WELLBUTRIN SR) 100 MG 12 hr tablet, TAKE 1 TABLET BY MOUTH TWICE DAILY, Disp: 180 tablet, Rfl: 3 .  Cranberry 500 MG CAPS, , Disp: , Rfl:  .  Docusate Calcium (STOOL SOFTENER PO), Take by mouth.  , Disp: , Rfl:  .  estradiol (ESTRACE) 1 MG tablet, Take 1 mg by mouth daily.  , Disp: , Rfl:  .  fluticasone (FLONASE) 50 MCG/ACT nasal spray, SPRAY 2  SPRAYS IN EACH NOSTRIL EVERY DAY, Disp: 48 g, Rfl: 0 .  losartan (COZAAR) 100 MG tablet, TAKE 1 TABLET(100 MG) BY MOUTH DAILY, Disp: 90 tablet, Rfl: 1 .  PREVIDENT 5000 DRY MOUTH 1.1 % GEL dental gel, , Disp: , Rfl:  .  VITAMIN D PO, Take 400 mg by mouth daily., Disp: , Rfl:  .  vitamin E 400 UNIT capsule, Take 400 Units by mouth daily.  , Disp: , Rfl:  .  traZODone (DESYREL) 50 MG tablet, Take 0.5-1 tablets (25-50 mg total) by mouth at bedtime as needed for sleep., Disp: 90 tablet, Rfl: 1  Current Facility-Administered Medications:  .  methylPREDNISolone acetate (DEPO-MEDROL) injection 40 mg, 40 mg, Intramuscular, Once, Darrick Huntsmanullo, Mar Daringeresa L, MD  EXAM:  VITALS per patient if applicable:  GENERAL: alert, oriented, appears well and in no acute distress  HEENT: atraumatic, conjunttiva clear, no obvious abnormalities on inspection of external nose and ears  NECK: normal movements of the head and neck  LUNGS: on inspection no signs of respiratory distress, breathing rate appears normal, no obvious gross SOB, gasping or wheezing  CV: no obvious cyanosis  MS: moves all visible extremities without noticeable abnormality  PSYCH/NEURO: pleasant and cooperative, no obvious depression or anxiety, speech and thought processing grossly intact  ASSESSMENT AND PLAN:   Hypertension Well controlled on current regimen. Renal function is due, no changes today.  Lab Results  Component Value Date   CREATININE 0.79 05/30/2018   Lab Results  Component Value Date   NA 138 05/30/2018   K 4.2 05/30/2018   CL 103 05/30/2018   CO2 28 05/30/2018     Impaired fasting glucose A1c  was  below the  prediabetic range at last check.  Repeat due .  Low GI diet and regular exercise recommended   Lab Results  Component Value Date   HGBA1C 5.7 05/30/2018     Mixed hyperlipidemia LDL and triglycerides have been at goal on current medications. She has no side effects and liver enzymes are due for repeat  assessment.  .    Lab Results  Component Value Date   CHOL 159 05/30/2018   HDL 74.30 05/30/2018   LDLCALC 63 05/30/2018   LDLDIRECT 72.0 05/15/2016   TRIG 110.0 05/30/2018   CHOLHDL 2 05/30/2018   Lab Results  Component Value Date   ALT 16 05/30/2018   AST 13 05/30/2018   ALKPHOS 59 05/30/2018   BILITOT 0.6 05/30/2018      Insomnia Discussed natural remedies for insomnia including herbal tea and melatonin.  Reviewd principles of good sleep hygiene.  Trial of trazodone     I discussed the assessment and treatment plan with the patient. The patient was provided an opportunity  to ask questions and all were answered. The patient agreed with the plan and demonstrated an understanding of the instructions.   The patient was advised to call back or seek an in-person evaluation if the symptoms worsen or if the condition fails to improve as anticipated.  I provided 25 minutes of non-face-to-face time during this encounter.   Sherlene Shamseresa L , MD

## 2019-01-07 DIAGNOSIS — G4700 Insomnia, unspecified: Secondary | ICD-10-CM | POA: Insufficient documentation

## 2019-01-07 NOTE — Assessment & Plan Note (Signed)
Discussed natural remedies for insomnia including herbal tea and melatonin.  Reviewd principles of good sleep hygiene.  Trial of trazodone

## 2019-01-07 NOTE — Assessment & Plan Note (Signed)
A1c  was  below the  prediabetic range at last check.  Repeat due .  Low GI diet and regular exercise recommended   Lab Results  Component Value Date   HGBA1C 5.7 05/30/2018

## 2019-01-07 NOTE — Assessment & Plan Note (Signed)
Well controlled on current regimen. Renal function is due, no changes today.  Lab Results  Component Value Date   CREATININE 0.79 05/30/2018   Lab Results  Component Value Date   NA 138 05/30/2018   K 4.2 05/30/2018   CL 103 05/30/2018   CO2 28 05/30/2018

## 2019-01-07 NOTE — Assessment & Plan Note (Signed)
LDL and triglycerides have been at goal on current medications. She has no side effects and liver enzymes are due for repeat assessment.  .    Lab Results  Component Value Date   CHOL 159 05/30/2018   HDL 74.30 05/30/2018   LDLCALC 63 05/30/2018   LDLDIRECT 72.0 05/15/2016   TRIG 110.0 05/30/2018   CHOLHDL 2 05/30/2018   Lab Results  Component Value Date   ALT 16 05/30/2018   AST 13 05/30/2018   ALKPHOS 59 05/30/2018   BILITOT 0.6 05/30/2018

## 2019-01-24 ENCOUNTER — Other Ambulatory Visit: Payer: Self-pay

## 2019-01-24 ENCOUNTER — Other Ambulatory Visit (INDEPENDENT_AMBULATORY_CARE_PROVIDER_SITE_OTHER): Payer: Medicare Other

## 2019-01-24 DIAGNOSIS — I10 Essential (primary) hypertension: Secondary | ICD-10-CM

## 2019-01-24 DIAGNOSIS — R7301 Impaired fasting glucose: Secondary | ICD-10-CM | POA: Diagnosis not present

## 2019-01-24 DIAGNOSIS — E782 Mixed hyperlipidemia: Secondary | ICD-10-CM | POA: Diagnosis not present

## 2019-01-24 DIAGNOSIS — M1990 Unspecified osteoarthritis, unspecified site: Secondary | ICD-10-CM

## 2019-01-24 LAB — CBC WITH DIFFERENTIAL/PLATELET
Basophils Absolute: 0.1 10*3/uL (ref 0.0–0.1)
Basophils Relative: 1 % (ref 0.0–3.0)
Eosinophils Absolute: 0.3 10*3/uL (ref 0.0–0.7)
Eosinophils Relative: 4.9 % (ref 0.0–5.0)
HCT: 40.9 % (ref 36.0–46.0)
Hemoglobin: 14.1 g/dL (ref 12.0–15.0)
Lymphocytes Relative: 25.6 % (ref 12.0–46.0)
Lymphs Abs: 1.6 10*3/uL (ref 0.7–4.0)
MCHC: 34.4 g/dL (ref 30.0–36.0)
MCV: 95.2 fl (ref 78.0–100.0)
Monocytes Absolute: 0.6 10*3/uL (ref 0.1–1.0)
Monocytes Relative: 9 % (ref 3.0–12.0)
Neutro Abs: 3.7 10*3/uL (ref 1.4–7.7)
Neutrophils Relative %: 59.5 % (ref 43.0–77.0)
Platelets: 297 10*3/uL (ref 150.0–400.0)
RBC: 4.3 Mil/uL (ref 3.87–5.11)
RDW: 12.6 % (ref 11.5–15.5)
WBC: 6.2 10*3/uL (ref 4.0–10.5)

## 2019-01-24 LAB — C-REACTIVE PROTEIN: CRP: <1 mg/dL (ref 0.5–20.0)

## 2019-01-24 LAB — COMPREHENSIVE METABOLIC PANEL
ALT: 14 U/L (ref 0–35)
AST: 12 U/L (ref 0–37)
Albumin: 4.1 g/dL (ref 3.5–5.2)
Alkaline Phosphatase: 70 U/L (ref 39–117)
BUN: 17 mg/dL (ref 6–23)
CO2: 30 mEq/L (ref 19–32)
Calcium: 9.2 mg/dL (ref 8.4–10.5)
Chloride: 102 mEq/L (ref 96–112)
Creatinine, Ser: 0.86 mg/dL (ref 0.40–1.20)
GFR: 65.43 mL/min (ref >60.00)
Glucose, Bld: 121 mg/dL — ABNORMAL HIGH (ref 70–99)
Potassium: 4.2 mEq/L (ref 3.5–5.1)
Sodium: 139 mEq/L (ref 135–145)
Total Bilirubin: 0.5 mg/dL (ref 0.2–1.2)
Total Protein: 6.9 g/dL (ref 6.0–8.3)

## 2019-01-24 LAB — LIPID PANEL
Cholesterol: 169 mg/dL (ref 0–200)
HDL: 65.2 mg/dL (ref >39.00)
LDL Cholesterol: 83 mg/dL (ref 0–99)
NonHDL: 103.57
Total CHOL/HDL Ratio: 3
Triglycerides: 101 mg/dL (ref 0.0–149.0)
VLDL: 20.2 mg/dL (ref 0.0–40.0)

## 2019-01-24 LAB — HEMOGLOBIN A1C: Hgb A1c MFr Bld: 6 % (ref 4.6–6.5)

## 2019-01-24 LAB — URIC ACID: Uric Acid, Serum: 4 mg/dL (ref 2.4–7.0)

## 2019-01-24 LAB — SEDIMENTATION RATE: Sed Rate: 14 mm/hr (ref 0–30)

## 2019-01-26 LAB — ANTI-NUCLEAR AB-TITER (ANA TITER)
ANA TITER: 1:40 {titer} — ABNORMAL HIGH
ANA Titer 1: 1:40 {titer} — ABNORMAL HIGH

## 2019-01-26 LAB — ANA: Anti Nuclear Antibody (ANA): POSITIVE — AB

## 2019-01-27 ENCOUNTER — Other Ambulatory Visit: Payer: Self-pay | Admitting: Internal Medicine

## 2019-01-27 DIAGNOSIS — M130 Polyarthritis, unspecified: Secondary | ICD-10-CM

## 2019-01-27 DIAGNOSIS — R768 Other specified abnormal immunological findings in serum: Secondary | ICD-10-CM

## 2019-01-27 NOTE — Progress Notes (Signed)
amb ref to rheumatology in progress for positive ana

## 2019-02-13 ENCOUNTER — Other Ambulatory Visit: Payer: Self-pay | Admitting: Internal Medicine

## 2019-02-21 MED ORDER — LOSARTAN POTASSIUM-HCTZ 100-25 MG PO TABS: 1.0000 | ORAL_TABLET | Freq: Every day | ORAL | 3 refills | Status: DC

## 2019-02-21 MED ORDER — AMLODIPINE BESYLATE 5 MG PO TABS: 5.0000 mg | ORAL_TABLET | Freq: Every day | ORAL | 3 refills | Status: DC

## 2019-05-01 ENCOUNTER — Other Ambulatory Visit: Payer: Self-pay

## 2019-05-01 DIAGNOSIS — E785 Hyperlipidemia, unspecified: Secondary | ICD-10-CM

## 2019-05-01 MED ORDER — ATORVASTATIN CALCIUM 20 MG PO TABS: ORAL_TABLET | ORAL | 3 refills | Status: DC

## 2019-05-20 ENCOUNTER — Other Ambulatory Visit: Payer: Self-pay | Admitting: Internal Medicine

## 2019-05-30 ENCOUNTER — Telehealth: Payer: Self-pay | Admitting: Internal Medicine

## 2019-05-30 NOTE — Telephone Encounter (Signed)
Pt spouse will have pt call back to schedule Medicare Annual Wellness Visit (AWV) either virtually/audio only.  Last AWV 7.5.18 ; please schedule at anytime with Denisa O'Brien-Blaney at [facility] for [facility] to schedule

## 2019-06-13 ENCOUNTER — Ambulatory Visit (INDEPENDENT_AMBULATORY_CARE_PROVIDER_SITE_OTHER): Payer: Medicare PPO

## 2019-06-13 ENCOUNTER — Other Ambulatory Visit: Payer: Self-pay

## 2019-06-13 VITALS — Ht 64.0 in | Wt 165.0 lb

## 2019-06-13 DIAGNOSIS — Z1159 Encounter for screening for other viral diseases: Secondary | ICD-10-CM

## 2019-06-13 DIAGNOSIS — Z0000 Encounter for general adult medical examination without abnormal findings: Secondary | ICD-10-CM | POA: Diagnosis not present

## 2019-06-13 NOTE — Patient Instructions (Addendum)
  Shelley Zuniga , Thank you for taking time to come for your Medicare Wellness Visit. I appreciate your ongoing commitment to your health goals. Please review the following plan we discussed and let me know if I can assist you in the future.   These are the goals we discussed: Goals      Patient Stated   . Healthy Lifestyle (pt-stated)     Eat a healthy diet Stay active       This is a list of the screening recommended for you and due dates:  Health Maintenance  Topic Date Due  .  Hepatitis C: One time screening is recommended by Center for Disease Control  (CDC) for  adults born from [birth year] through 1966.   1950-09-02  . Pneumonia vaccines (2 of 2 - PPSV23) 05/15/2017  . Mammogram  10/12/2019  . Cologuard (Stool DNA test)  11/17/2019  . Tetanus Vaccine  12/07/2028  . Flu Shot  Completed  . DEXA scan (bone density measurement)  Completed

## 2019-06-13 NOTE — Progress Notes (Addendum)
Subjective:   SARABELLE GENSON is a 69 y.o. female who presents for Medicare Annual (Subsequent) preventive examination.  Review of Systems:  No ROS.  Medicare Wellness Virtual Visit.  Visual/audio telehealth visit, UTA vital signs.   Ht/Wt provided.  See social history for additional risk factors.   Cardiac Risk Factors include: advanced age (>69men, >35 women);hypertension     Objective:     Vitals: Ht 5\' 4"  (1.626 m)   Wt 165 lb (74.8 kg)   BMI 28.32 kg/m   Body mass index is 28.32 kg/m.  Advanced Directives 06/13/2019  Does Patient Have a Medical Advance Directive? No  Would patient like information on creating a medical advance directive? No - Patient declined    Tobacco Social History   Tobacco Use  Smoking Status Never Smoker  Smokeless Tobacco Never Used     Counseling given: Not Answered   Clinical Intake:  Pre-visit preparation completed: Yes        Diabetes: No  How often do you need to have someone help you when you read instructions, pamphlets, or other written materials from your doctor or pharmacy?: 1 - Never  Interpreter Needed?: No     Past Medical History:  Diagnosis Date  . Colon polyp 2001   colonoscopy, [facility]   . Strabismus age 59   eye surgery   Past Surgical History:  Procedure Laterality Date  . ABDOMINAL HYSTERECTOMY  2000   [facility], [facility], fibroids/menorrhagia  . BASAL CELL CARCINOMA EXCISION Left 2013   Dr. 2014  . BILATERAL OOPHORECTOMY  2000   fibroid cysts  . EYE SURGERY  1956   to correct strabismus  . GALLBLADDER SURGERY    . HEMORRHOID SURGERY     Family History  Problem Relation Age of Onset  . Cancer Mother        melanoma, squamous cell on nose  . Cancer Father        lung, tobacco user  . Cancer Brother        throat, tobacco user  . Cancer Maternal Grandmother        uterine/ovarian   Social History   Socioeconomic History  . Marital status: Married    Spouse name: Not on file  . Number  of children: Not on file  . Years of education: Not on file  . Highest education level: Not on file  Occupational History  . Not on file  Tobacco Use  . Smoking status: Never Smoker  . Smokeless tobacco: Never Used  Substance and Sexual Activity  . Alcohol use: No  . Drug use: No  . Sexual activity: Yes    Birth control/protection: Post-menopausal  Other Topics Concern  . Not on file  Social History Narrative  . Not on file   Social Determinants of Health   Financial Resource Strain:   . Difficulty of Paying Living Expenses: Not on file  Food Insecurity:   . Worried About Orson Aloe in the Last Year: Not on file  . Ran Out of Food in the Last Year: Not on file  Transportation Needs:   . Lack of Transportation (Medical): Not on file  . Lack of Transportation (Non-Medical): Not on file  Physical Activity:   . Days of Exercise per Week: Not on file  . Minutes of Exercise per Session: Not on file  Stress:   . Feeling of Stress : Not on file  Social Connections:   . Frequency of Communication with Friends  and Family: Not on file  . Frequency of Social Gatherings with Friends and Family: Not on file  . Attends Religious Services: Not on file  . Active Member of Clubs or Organizations: Not on file  . Attends Banker Meetings: Not on file  . Marital Status: Not on file    Outpatient Encounter Medications as of 06/13/2019  Medication Sig  . ALPRAZolam (XANAX) 0.25 MG tablet Take 1 tablet (0.25 mg total) by mouth at bedtime as needed for anxiety.  Marland Kitchen amLODipine (NORVASC) 5 MG tablet Take 1 tablet (5 mg total) by mouth daily.  . Ascorbic Acid (VITAMIN C PO) Take 500 mg by mouth daily.  Marland Kitchen aspirin 81 MG tablet Take 81 mg by mouth daily.    Marland Kitchen atorvastatin (LIPITOR) 20 MG tablet TAKE 1 TABLET(20 MG) BY MOUTH DAILY  . buPROPion (WELLBUTRIN SR) 100 MG 12 hr tablet TAKE 1 TABLET BY MOUTH TWICE DAILY  . Cranberry 500 MG CAPS   . Docusate Calcium (STOOL SOFTENER PO)  Take by mouth.    . estradiol (ESTRACE) 1 MG tablet Take 1 mg by mouth daily.    . fluticasone (FLONASE) 50 MCG/ACT nasal spray SPRAY 2 SPRAYS IN EACH NOSTRIL EVERY DAY  . losartan-hydrochlorothiazide (HYZAAR) 100-25 MG tablet Take 1 tablet by mouth daily.  Marland Kitchen PREVIDENT 5000 DRY MOUTH 1.1 % GEL dental gel   . VITAMIN D PO Take 400 mg by mouth daily.  . vitamin E 400 UNIT capsule Take 400 Units by mouth daily.    . Zinc 25 MG TABS Take 1 tablet by mouth.  . traZODone (DESYREL) 50 MG tablet Take 0.5-1 tablets (25-50 mg total) by mouth at bedtime as needed for sleep. (Patient not taking: Reported on 06/13/2019)   Facility-Administered Encounter Medications as of 06/13/2019  Medication  . methylPREDNISolone acetate (DEPO-MEDROL) injection 40 mg    Activities of Daily Living In your present state of health, do you have any difficulty performing the following activities: 06/13/2019  Hearing? Y  Comment Hearing aid, crossover  Vision? N  Difficulty concentrating or making decisions? N  Walking or climbing stairs? N  Dressing or bathing? N  Doing errands, shopping? N  Preparing Food and eating ? N  Using the Toilet? N  In the past six months, have you accidently leaked urine? N  Do you have problems with loss of bowel control? N  Managing your Medications? N  Managing your Finances? N  Housekeeping or managing your Housekeeping? N  Some recent data might be hidden    Patient Care Team: Sherlene Shams, MD as PCP - General (Internal Medicine)    Assessment:   This is a routine wellness examination for [place].  Nurse connected with patient 06/13/19 at 11:00 AM EST by a telephone enabled telemedicine application and verified that I am speaking with the correct person using two identifiers. Patient stated full name and DOB. Patient gave permission to continue with virtual visit. Patient's location was at home and Nurse's location was at [place] office.   Patient is alert and oriented  x3. Patient denies difficulty focusing or concentrating. Patient likes to read and play card games for brain stimulation.   Health Maintenance Due: -PNA vaccine- discussed; to be completed with doctor in visit or local pharmacy.  -Hep C Screening- consent given. See completed HM at the end of note.   Eye: Visual acuity not assessed. Virtual visit. Followed by their ophthalmologist.  Dental: UTD.    Hearing: Hearing aids-  yes, cross trainer.  Safety:  Patient feels safe at home- yes Patient does have smoke detectors at home- yes Patient does wear sunscreen or protective clothing when in direct sunlight - yes Patient does wear seat belt when in a moving vehicle - yes Patient drives- yes Adequate lighting in walkways free from debris- yes Grab bars and handrails used as appropriate- yes Ambulates with an assistive device- no Cell phone on person when ambulating outside of the home- yes  Social: Alcohol intake - no      Smoking history- never   Smokers in home? none Illicit drug use? none  Medication: Taking as directed and without issues.  Self managed - yes   Covid-19: Precautions and sickness symptoms discussed. Wears mask, social distancing, hand hygiene as appropriate.   Activities of Daily Living Patient denies needing assistance with: household chores, feeding themselves, getting from bed to chair, getting to the toilet, bathing/showering, dressing, managing money, or preparing meals.   Discussed the importance of a healthy diet, water intake and the benefits of aerobic exercise.   Physical activity- plans to walk for exercise.   Diet:  Regular Water: good intake  Other Providers Patient Care Team: Crecencio Mc, MD as PCP - General (Internal Medicine)  Exercise Activities and Dietary recommendations    Goals      Patient Stated   . Healthy Lifestyle (pt-stated)     Eat a healthy diet Stay active       Fall Risk Fall Risk  06/13/2019 05/30/2018  05/24/2017 05/15/2016  Falls in the past year? 0 0 No No  Follow up Falls evaluation completed - - -   Timed Get Up and Go performed: no, virtual visit  Depression Screen PHQ 2/9 Scores 06/13/2019 05/30/2018 05/24/2017 05/15/2016  PHQ - 2 Score 0 2 1 0  PHQ- 9 Score - 9 8 -     Cognitive Function     6CIT Screen 06/13/2019  What Year? 0 points  What month? 0 points  What time? 0 points  Count back from 20 0 points  Months in reverse 0 points    Immunization History  Administered Date(s) Administered  . Influenza Split 03/26/2012  . Influenza, High Dose Seasonal PF 02/22/2019  . Influenza,inj,Quad PF,6+ Mos 02/22/2013, 02/10/2014  . Influenza-Unspecified 04/04/2016, 03/13/2017, 01/31/2018  . PFIZER SARS-COV-2 Vaccination 05/25/2019  . Pneumococcal Conjugate-13 05/15/2016  . Td 12/08/2018  . Tdap 01/20/2010  . Zoster 10/19/2013   Screening Tests Health Maintenance  Topic Date Due  . Hepatitis C Screening  Jul 28, 1950  . PNA vac Low Risk Adult (2 of 2 - PPSV23) 05/15/2017  . MAMMOGRAM  10/12/2019  . Fecal DNA (Cologuard)  11/17/2019  . TETANUS/TDAP  12/07/2028  . INFLUENZA VACCINE  Completed  . DEXA SCAN  Completed       Plan:   Keep all routine maintenance appointments.   Follow up 06/23/19   Medicare Attestation I have personally reviewed: The patient's medical and social history Their use of alcohol, tobacco or illicit drugs Their current medications and supplements The patient's functional ability including ADLs,fall risks, home safety risks, cognitive, and hearing and visual impairment Diet and physical activities Evidence for depression   I have reviewed and discussed with patient certain preventive protocols, quality metrics, and best practice recommendations.      Varney Biles, LPN  06/17/8655  Agree  TMS

## 2019-06-16 DIAGNOSIS — H2513 Age-related nuclear cataract, bilateral: Secondary | ICD-10-CM | POA: Diagnosis not present

## 2019-06-16 DIAGNOSIS — H35372 Puckering of macula, left eye: Secondary | ICD-10-CM | POA: Diagnosis not present

## 2019-06-23 ENCOUNTER — Encounter: Payer: Self-pay | Admitting: Internal Medicine

## 2019-06-23 ENCOUNTER — Other Ambulatory Visit: Payer: Self-pay

## 2019-06-23 ENCOUNTER — Telehealth: Payer: Self-pay | Admitting: Internal Medicine

## 2019-06-23 ENCOUNTER — Ambulatory Visit (INDEPENDENT_AMBULATORY_CARE_PROVIDER_SITE_OTHER): Payer: Medicare PPO | Admitting: Internal Medicine

## 2019-06-23 VITALS — Ht 64.0 in | Wt 165.0 lb

## 2019-06-23 DIAGNOSIS — Z20822 Contact with and (suspected) exposure to covid-19: Secondary | ICD-10-CM | POA: Diagnosis not present

## 2019-06-23 DIAGNOSIS — R7301 Impaired fasting glucose: Secondary | ICD-10-CM

## 2019-06-23 DIAGNOSIS — E782 Mixed hyperlipidemia: Secondary | ICD-10-CM

## 2019-06-23 DIAGNOSIS — I10 Essential (primary) hypertension: Secondary | ICD-10-CM

## 2019-06-23 DIAGNOSIS — F5104 Psychophysiologic insomnia: Secondary | ICD-10-CM | POA: Diagnosis not present

## 2019-06-23 DIAGNOSIS — E559 Vitamin D deficiency, unspecified: Secondary | ICD-10-CM | POA: Diagnosis not present

## 2019-06-23 MED ORDER — ALPRAZOLAM 0.25 MG PO TABS: 0.2500 mg | ORAL_TABLET | Freq: Every evening | ORAL | 5 refills | Status: DC | PRN

## 2019-06-23 NOTE — Assessment & Plan Note (Signed)
Negative tet x 3 last one jan 23  Husband has recovered.  Vaccination completed.

## 2019-06-23 NOTE — Assessment & Plan Note (Signed)
A1c  was  Elevated into the  prediabetic range at last check.  Repeat due .  Low GI diet and regular exercise recommended   Lab Results  Component Value Date   HGBA1C 6.0 01/24/2019

## 2019-06-23 NOTE — Assessment & Plan Note (Signed)
Discussed natural remedies for insomnia including herbal tea and melatonin.  Reviewed principles of good sleep hygiene.  Trial of trazodone was not tolerated.  Aggravating factors: care of 70 yr old mother,  Husband's recent illnesss ,  Ongoing COVID 19 EPIDEMIC,   Continue minimal use of alprazolam.  The risks and benefits of benzodiazepine use were reviewed with patient today including excessive sedation leading to respiratory depression,  impaired thinking/driving, and addiction.  Patient was advised to avoid concurrent use with alcohol, to use medication only as needed and not to share with others  .

## 2019-06-23 NOTE — Assessment & Plan Note (Signed)
LDL and triglycerides have been at goal on current medications. She has no side effects and liver enzymes are due for repeat assessment.  .    Lab Results  Component Value Date   CHOL 169 01/24/2019   HDL 65.20 01/24/2019   LDLCALC 83 01/24/2019   LDLDIRECT 72.0 05/15/2016   TRIG 101.0 01/24/2019   CHOLHDL 3 01/24/2019   Lab Results  Component Value Date   ALT 14 01/24/2019   AST 12 01/24/2019   ALKPHOS 70 01/24/2019   BILITOT 0.5 01/24/2019

## 2019-06-23 NOTE — Progress Notes (Signed)
Virtual Visit via [email or web address]  This visit type was conducted due to national recommendations for restrictions regarding the COVID-19 pandemic (e.g. social distancing).  This format is felt to be most appropriate for this patient at this time.  All issues noted in this document were discussed and addressed.  No physical exam was performed (except for noted visual exam findings with Video Visits).   I connected with@ on 06/23/19 at  8:00 AM EST by a video enabled telemedicine application  and verified that I am speaking with the correct person using two identifiers. Location patient: home Location provider: work or home office Persons participating in the virtual visit: patient, provider  I discussed the limitations, risks, security and privacy concerns of performing an evaluation and management service by telephone and the availability of in person appointments. I also discussed with the patient that there may be a patient responsible charge related to this service. The patient expressed understanding and agreed to proceed.  Reason for visit: follow up  HPI:  69 yr old female with history of hypertension, hyperlipidemia and prediabetes presents for follow up.  Joint pain,  Positive ANA:  Rheumatology eval reviewed.  Diagnosed with OA of CMC joints, false positive ANA. No progression,  Doing the exercises daily , not using any meds on a daily basis.   Patient is taking her medications as prescribed and notes no adverse effects.  Home BP readings have been done about once per week and are  generally <140/90 .  She is avoiding added salt in her diet and has not been walking regularly due to increased caregiver burden of caring for mother and husband   The patient has no signs or symptoms of COVID 19 infection (fever, cough, sore throat  or shortness of breath beyond what is typical for patient) despite caring for husband who was diagnosed with COVID 19 IN early January, she has been tested x 3, all  negative, last one Jan .23.  She has received both doses of the Pfizer COVID 19 vaccine  Her anxiety manifests as insomnia  is triggered by the serious health conditions of her husband and mother son.  She is using the alprazolam every 2-3 days 1/2 tablet . Marland Kitchen  The patient feels generally well, was walking several times per week for exercise and weighing weekly .  Following a carbohydrate modified diet 5 days per week. Denies any significant weight change, and has no symptoms suggestive of progression to diabetes (polyuria, polyphagia, polydipsia).     ROS: See pertinent positives and negatives per HPI.  Past Medical History:  Diagnosis Date  . Colon polyp 2001   colonoscopy, [facility]   . Strabismus age 65   eye surgery    Past Surgical History:  Procedure Laterality Date  . ABDOMINAL HYSTERECTOMY  2000   [facility], [facility], fibroids/menorrhagia  . BASAL CELL CARCINOMA EXCISION Left 2013   Dr. Orson Aloe  . BILATERAL OOPHORECTOMY  2000   fibroid cysts  . EYE SURGERY  1956   to correct strabismus  . GALLBLADDER SURGERY    . HEMORRHOID SURGERY      Family History  Problem Relation Age of Onset  . Cancer Mother        melanoma, squamous cell on nose  . Cancer Father        lung, tobacco user  . Cancer Brother        throat, tobacco user  . Cancer Maternal Grandmother        uterine/ovarian  SOCIAL HX:  reports that she has never smoked. She has never used smokeless tobacco. She reports that she does not drink alcohol or use drugs.   Current Outpatient Medications:  .  ALPRAZolam (XANAX) 0.25 MG tablet, Take 1 tablet (0.25 mg total) by mouth at bedtime as needed for anxiety., Disp: 30 tablet, Rfl: 3 .  amLODipine (NORVASC) 5 MG tablet, Take 1 tablet (5 mg total) by mouth daily., Disp: 90 tablet, Rfl: 3 .  Ascorbic Acid (VITAMIN C PO), Take 500 mg by mouth daily., Disp: , Rfl:  .  aspirin 81 MG tablet, Take 81 mg by mouth daily.  , Disp: , Rfl:  .  atorvastatin (LIPITOR) 20 MG  tablet, TAKE 1 TABLET(20 MG) BY MOUTH DAILY, Disp: 90 tablet, Rfl: 3 .  buPROPion (WELLBUTRIN SR) 100 MG 12 hr tablet, TAKE 1 TABLET BY MOUTH TWICE DAILY, Disp: 180 tablet, Rfl: 3 .  Cranberry 500 MG CAPS, , Disp: , Rfl:  .  Docusate Calcium (STOOL SOFTENER PO), Take by mouth.  , Disp: , Rfl:  .  estradiol (ESTRACE) 1 MG tablet, Take 1 mg by mouth daily.  , Disp: , Rfl:  .  fluticasone (FLONASE) 50 MCG/ACT nasal spray, SPRAY 2 SPRAYS IN EACH NOSTRIL EVERY DAY, Disp: 48 g, Rfl: 0 .  losartan-hydrochlorothiazide (HYZAAR) 100-25 MG tablet, Take 1 tablet by mouth daily., Disp: 90 tablet, Rfl: 3 .  PREVIDENT 5000 DRY MOUTH 1.1 % GEL dental gel, , Disp: , Rfl:  .  traZODone (DESYREL) 50 MG tablet, Take 0.5-1 tablets (25-50 mg total) by mouth at bedtime as needed for sleep., Disp: 90 tablet, Rfl: 1 .  VITAMIN D PO, Take 400 mg by mouth daily., Disp: , Rfl:  .  vitamin E 400 UNIT capsule, Take 400 Units by mouth daily.  , Disp: , Rfl:  .  Zinc 25 MG TABS, Take 1 tablet by mouth., Disp: , Rfl:   Current Facility-Administered Medications:  .  methylPREDNISolone acetate (DEPO-MEDROL) injection 40 mg, 40 mg, Intramuscular, Once, Derrel Nip, Aris Everts, MD  EXAM:  VITALS per patient if applicable:  GENERAL: alert, oriented, appears well and in no acute distress  HEENT: atraumatic, conjunttiva clear, no obvious abnormalities on inspection of external nose and ears  NECK: normal movements of the head and neck  LUNGS: on inspection no signs of respiratory distress, breathing rate appears normal, no obvious gross SOB, gasping or wheezing  CV: no obvious cyanosis  MS: moves all visible extremities without noticeable abnormality  PSYCH/NEURO: pleasant and cooperative, no obvious depression or anxiety, speech and thought processing grossly intact  ASSESSMENT AND PLAN:  Discussed the following assessment and plan:  Mixed hyperlipidemia - Plan: Lipid panel  Impaired fasting glucose - Plan: Hemoglobin  A1c  Vitamin D deficiency - Plan: VITAMIN D 25 Hydroxy (Vit-D Deficiency, Fractures)  Essential hypertension - Plan: Comprehensive metabolic panel, Microalbumin / creatinine urine ratio  Psychophysiological insomnia  Close exposure to COVID-19 virus  Hypertension she reports compliance with medication regimen  but has had borderline readings from memory by home readings .  She is not using NSAIDs daily.  Discussed goal of 120/70  (130/80 for patients over 70)  to preserve renal function.  She has been asked to check her  BP  at home and  submit readings for evaluation. Renal function, electrolytes and screen for proteinuria have been normal and will be rechecked   Impaired fasting glucose A1c  was  Elevated into the  prediabetic range at  last check.  Repeat due .  Low GI diet and regular exercise recommended   Lab Results  Component Value Date   HGBA1C 6.0 01/24/2019     Insomnia Discussed natural remedies for insomnia including herbal tea and melatonin.  Reviewed principles of good sleep hygiene.  Trial of trazodone was not tolerated.  Aggravating factors: care of 85 yr old mother,  Husband's recent illnesss ,  Ongoing COVID 19 EPIDEMIC,   Continue minimal use of alprazolam.  The risks and benefits of benzodiazepine use were reviewed with patient today including excessive sedation leading to respiratory depression,  impaired thinking/driving, and addiction.  Patient was advised to avoid concurrent use with alcohol, to use medication only as needed and not to share with others  .   Mixed hyperlipidemia LDL and triglycerides have been at goal on current medications. She has no side effects and liver enzymes are due for repeat assessment.  .    Lab Results  Component Value Date   CHOL 169 01/24/2019   HDL 65.20 01/24/2019   LDLCALC 83 01/24/2019   LDLDIRECT 72.0 05/15/2016   TRIG 101.0 01/24/2019   CHOLHDL 3 01/24/2019   Lab Results  Component Value Date   ALT 14 01/24/2019    AST 12 01/24/2019   ALKPHOS 70 01/24/2019   BILITOT 0.5 01/24/2019      Close exposure to COVID-19 virus Negative tet x 3 last one jan 23  Husband has recovered.  Vaccination completed.     I discussed the assessment and treatment plan with the patient. The patient was provided an opportunity to ask questions and all were answered. The patient agreed with the plan and demonstrated an understanding of the instructions.   The patient was advised to call back or seek an in-person evaluation if the symptoms worsen or if the condition fails to improve as anticipated.  I provided 05 minutes of non-face-to-face time during this encounter reviewing patient's current problems and past evaluation by rheumatology , providing counseling on the above mentioned problems , and coordination  of care .   Sherlene Shams, MD

## 2019-06-23 NOTE — Assessment & Plan Note (Signed)
she reports compliance with medication regimen  but has had borderline readings from memory by home readings .  She is not using NSAIDs daily.  Discussed goal of 120/70  (130/80 for patients over 70)  to preserve renal function.  She has been asked to check her  BP  at home and  submit readings for evaluation. Renal function, electrolytes and screen for proteinuria have been normal and will be rechecked

## 2019-06-23 NOTE — Telephone Encounter (Signed)
Lm with husband to call office and set up a 3 hour fasting lab in March, per Dr. Darrick Huntsman.

## 2019-06-28 DIAGNOSIS — I10 Essential (primary) hypertension: Secondary | ICD-10-CM | POA: Diagnosis not present

## 2019-06-28 DIAGNOSIS — H2512 Age-related nuclear cataract, left eye: Secondary | ICD-10-CM | POA: Diagnosis not present

## 2019-07-04 ENCOUNTER — Other Ambulatory Visit: Payer: Self-pay

## 2019-07-04 ENCOUNTER — Encounter: Payer: Self-pay | Admitting: Ophthalmology

## 2019-07-07 ENCOUNTER — Other Ambulatory Visit
Admission: RE | Admit: 2019-07-07 | Discharge: 2019-07-07 | Disposition: A | Discharge status: Home / Self Care | Payer: Medicare PPO | Source: Ambulatory Visit | Attending: Ophthalmology | Admitting: Ophthalmology

## 2019-07-07 DIAGNOSIS — Z20822 Contact with and (suspected) exposure to covid-19: Secondary | ICD-10-CM | POA: Diagnosis not present

## 2019-07-07 DIAGNOSIS — Z01812 Encounter for preprocedural laboratory examination: Secondary | ICD-10-CM | POA: Diagnosis not present

## 2019-07-07 LAB — SARS CORONAVIRUS 2 (TAT 6-24 HRS): SARS Coronavirus 2: NEGATIVE

## 2019-07-07 NOTE — Discharge Instructions (Signed)

## 2019-07-11 ENCOUNTER — Encounter
Admission: RE | Disposition: A | Discharge status: Home / Self Care | Payer: Self-pay | Source: Ambulatory Visit | Attending: Ophthalmology

## 2019-07-11 ENCOUNTER — Ambulatory Visit: Payer: Medicare PPO | Admitting: Anesthesiology

## 2019-07-11 ENCOUNTER — Ambulatory Visit
Admission: RE | Admit: 2019-07-11 | Discharge: 2019-07-11 | Disposition: A | Discharge status: Home / Self Care | Payer: Medicare PPO | Source: Ambulatory Visit | Attending: Ophthalmology | Admitting: Ophthalmology

## 2019-07-11 ENCOUNTER — Encounter: Payer: Self-pay | Admitting: Ophthalmology

## 2019-07-11 ENCOUNTER — Other Ambulatory Visit: Payer: Self-pay

## 2019-07-11 DIAGNOSIS — F419 Anxiety disorder, unspecified: Secondary | ICD-10-CM | POA: Diagnosis not present

## 2019-07-11 DIAGNOSIS — Z85828 Personal history of other malignant neoplasm of skin: Secondary | ICD-10-CM | POA: Insufficient documentation

## 2019-07-11 DIAGNOSIS — R0601 Orthopnea: Secondary | ICD-10-CM | POA: Insufficient documentation

## 2019-07-11 DIAGNOSIS — Z9049 Acquired absence of other specified parts of digestive tract: Secondary | ICD-10-CM | POA: Diagnosis not present

## 2019-07-11 DIAGNOSIS — E7800 Pure hypercholesterolemia, unspecified: Secondary | ICD-10-CM | POA: Insufficient documentation

## 2019-07-11 DIAGNOSIS — I10 Essential (primary) hypertension: Secondary | ICD-10-CM | POA: Insufficient documentation

## 2019-07-11 DIAGNOSIS — R0602 Shortness of breath: Secondary | ICD-10-CM | POA: Diagnosis not present

## 2019-07-11 DIAGNOSIS — J45909 Unspecified asthma, uncomplicated: Secondary | ICD-10-CM | POA: Insufficient documentation

## 2019-07-11 DIAGNOSIS — Z882 Allergy status to sulfonamides status: Secondary | ICD-10-CM | POA: Insufficient documentation

## 2019-07-11 DIAGNOSIS — H2512 Age-related nuclear cataract, left eye: Secondary | ICD-10-CM | POA: Insufficient documentation

## 2019-07-11 DIAGNOSIS — R519 Headache, unspecified: Secondary | ICD-10-CM | POA: Diagnosis not present

## 2019-07-11 DIAGNOSIS — M5432 Sciatica, left side: Secondary | ICD-10-CM | POA: Insufficient documentation

## 2019-07-11 DIAGNOSIS — E538 Deficiency of other specified B group vitamins: Secondary | ICD-10-CM | POA: Diagnosis not present

## 2019-07-11 DIAGNOSIS — R42 Dizziness and giddiness: Secondary | ICD-10-CM | POA: Insufficient documentation

## 2019-07-11 DIAGNOSIS — Z885 Allergy status to narcotic agent status: Secondary | ICD-10-CM | POA: Insufficient documentation

## 2019-07-11 DIAGNOSIS — Z90710 Acquired absence of both cervix and uterus: Secondary | ICD-10-CM | POA: Insufficient documentation

## 2019-07-11 DIAGNOSIS — H25812 Combined forms of age-related cataract, left eye: Secondary | ICD-10-CM | POA: Diagnosis not present

## 2019-07-11 HISTORY — DX: Cardiac murmur, unspecified: R01.1

## 2019-07-11 HISTORY — DX: Motion sickness, initial encounter: T75.3XXA

## 2019-07-11 HISTORY — DX: Essential (primary) hypertension: I10

## 2019-07-11 HISTORY — DX: Headache, unspecified: R51.9

## 2019-07-11 HISTORY — DX: Other specified postprocedural states: R11.2

## 2019-07-11 HISTORY — DX: Presence of external hearing-aid: Z97.4

## 2019-07-11 HISTORY — DX: Other specified postprocedural states: Z98.890

## 2019-07-11 HISTORY — PX: CATARACT EXTRACTION W/PHACO: SHX586

## 2019-07-11 SURGERY — PHACOEMULSIFICATION, CATARACT, WITH IOL INSERTION
Anesthesia: Monitor Anesthesia Care | Site: Eye | Laterality: Left

## 2019-07-11 MED ORDER — MIDAZOLAM HCL 2 MG/2ML IJ SOLN
INTRAMUSCULAR | Status: DC | PRN
  Administered 2019-07-11: 1.5 mg via INTRAVENOUS

## 2019-07-11 MED ORDER — ARMC OPHTHALMIC DILATING DROPS
1.0000 "application " | OPHTHALMIC | Status: DC | PRN
  Administered 2019-07-11 (×3): 1 via OPHTHALMIC

## 2019-07-11 MED ORDER — TETRACAINE HCL 0.5 % OP SOLN
1.0000 [drp] | OPHTHALMIC | Status: DC | PRN
  Administered 2019-07-11 (×3): 1 [drp] via OPHTHALMIC

## 2019-07-11 MED ORDER — NA CHONDROIT SULF-NA HYALURON 40-17 MG/ML IO SOLN
INTRAOCULAR | Status: DC | PRN
  Administered 2019-07-11: 1 mL via INTRAOCULAR

## 2019-07-11 MED ORDER — EPINEPHRINE PF 1 MG/ML IJ SOLN
INTRAOCULAR | Status: DC | PRN
  Administered 2019-07-11: 47 mL via OPHTHALMIC

## 2019-07-11 MED ORDER — LIDOCAINE HCL (PF) 2 % IJ SOLN
INTRAOCULAR | Status: DC | PRN
  Administered 2019-07-11: 2 mL

## 2019-07-11 MED ORDER — FENTANYL CITRATE (PF) 100 MCG/2ML IJ SOLN
INTRAMUSCULAR | Status: DC | PRN
  Administered 2019-07-11: 50 ug via INTRAVENOUS

## 2019-07-11 MED ORDER — LACTATED RINGERS IV SOLN: INTRAVENOUS | Status: DC

## 2019-07-11 MED ORDER — BRIMONIDINE TARTRATE-TIMOLOL 0.2-0.5 % OP SOLN
OPHTHALMIC | Status: DC | PRN
  Administered 2019-07-11: 1 [drp] via OPHTHALMIC

## 2019-07-11 MED ORDER — MOXIFLOXACIN HCL 0.5 % OP SOLN
OPHTHALMIC | Status: DC | PRN
  Administered 2019-07-11: 0.2 mL via OPHTHALMIC

## 2019-07-11 SURGICAL SUPPLY — 21 items
CANNULA ANT/CHMB 27G (MISCELLANEOUS) ×2 IMPLANT
CANNULA ANT/CHMB 27GA (MISCELLANEOUS) ×6 IMPLANT
GLOVE SURG LX 8.0 MICRO (GLOVE) ×2
GLOVE SURG LX STRL 8.0 MICRO (GLOVE) ×1 IMPLANT
GLOVE SURG TRIUMPH 8.0 PF LTX (GLOVE) ×3 IMPLANT
GOWN STRL REUS W/ TWL LRG LVL3 (GOWN DISPOSABLE) ×2 IMPLANT
GOWN STRL REUS W/TWL LRG LVL3 (GOWN DISPOSABLE) ×4
LENS IOL ACRSF IQ ULTRA 8.5 (Intraocular Lens) IMPLANT
LENS IOL ACRYSOF IQ 8.5 (Intraocular Lens) ×3 IMPLANT
MARKER SKIN DUAL TIP RULER LAB (MISCELLANEOUS) ×3 IMPLANT
NDL FILTER BLUNT 18X1 1/2 (NEEDLE) ×1 IMPLANT
NDL RETROBULBAR .5 NSTRL (NEEDLE) ×3 IMPLANT
NEEDLE FILTER BLUNT 18X 1/2SAF (NEEDLE) ×2
NEEDLE FILTER BLUNT 18X1 1/2 (NEEDLE) ×1 IMPLANT
PACK EYE AFTER SURG (MISCELLANEOUS) ×3 IMPLANT
PACK OPTHALMIC (MISCELLANEOUS) ×3 IMPLANT
PACK PORFILIO (MISCELLANEOUS) ×3 IMPLANT
SYR 3ML LL SCALE MARK (SYRINGE) ×3 IMPLANT
SYR TB 1ML LUER SLIP (SYRINGE) ×3 IMPLANT
WATER STERILE IRR 250ML POUR (IV SOLUTION) ×3 IMPLANT
WIPE NON LINTING 3.25X3.25 (MISCELLANEOUS) ×3 IMPLANT

## 2019-07-11 NOTE — Anesthesia Procedure Notes (Signed)
Procedure Name: MAC Performed by: Selam Pietsch, CRNA Pre-anesthesia Checklist: Patient identified, Emergency Drugs available, Suction available, Timeout performed and Patient being monitored Patient Re-evaluated:Patient Re-evaluated prior to induction Oxygen Delivery Method: Nasal cannula Placement Confirmation: positive ETCO2       

## 2019-07-11 NOTE — H&P (Signed)
All labs reviewed. Abnormal studies sent to patients PCP when indicated.  Previous H&P reviewed, patient examined, there are NO CHANGES.  Shelley Mcclune Porfilio2/23/202112:39 PM

## 2019-07-11 NOTE — Transfer of Care (Signed)
Immediate Anesthesia Transfer of Care Note  Patient: Shelley Zuniga  Procedure(s) Performed: CATARACT EXTRACTION PHACO AND INTRAOCULAR LENS PLACEMENT (IOC) LEFT (Left Eye)  Patient Location: PACU  Anesthesia Type: MAC  Level of Consciousness: awake, alert  and patient cooperative  Airway and Oxygen Therapy: Patient Spontanous Breathing and Patient connected to supplemental oxygen  Post-op Assessment: Post-op Vital signs reviewed, Patient's Cardiovascular Status Stable, Respiratory Function Stable, Patent Airway and No signs of Nausea or vomiting  Post-op Vital Signs: Reviewed and stable  Complications: No apparent anesthesia complications

## 2019-07-11 NOTE — Anesthesia Preprocedure Evaluation (Signed)
Anesthesia Evaluation  Patient identified by MRN, date of birth, ID band Patient awake    Reviewed: NPO status   History of Anesthesia Complications (+) PONV and history of anesthetic complications  Airway Mallampati: II  TM Distance: >3 FB Neck ROM: full    Dental no notable dental hx.    Pulmonary    Pulmonary exam normal        Cardiovascular Exercise Tolerance: Good hypertension, Normal cardiovascular exam+ Valvular Problems/Murmurs (benign murmur)      Neuro/Psych  Headaches, Anxiety Sciatica L leg  negative psych ROS   GI/Hepatic negative GI ROS, Neg liver ROS,   Endo/Other  negative endocrine ROS  Renal/GU negative Renal ROS  negative genitourinary   Musculoskeletal   Abdominal   Peds  Hematology negative hematology ROS (+)   Anesthesia Other Findings Covid; NEG.  Reproductive/Obstetrics                             Anesthesia Physical Anesthesia Plan  ASA: II  Anesthesia Plan: MAC   Post-op Pain Management:    Induction:   PONV Risk Score and Plan: 3 and TIVA, Midazolam and Ondansetron  Airway Management Planned:   Additional Equipment:   Intra-op Plan:   Post-operative Plan:   Informed Consent: I have reviewed the patients History and Physical, chart, labs and discussed the procedure including the risks, benefits and alternatives for the proposed anesthesia with the patient or authorized representative who has indicated his/her understanding and acceptance.       Plan Discussed with: CRNA  Anesthesia Plan Comments:         Anesthesia Quick Evaluation

## 2019-07-11 NOTE — Op Note (Signed)
PREOPERATIVE DIAGNOSIS:  Nuclear sclerotic cataract of the left eye.   POSTOPERATIVE DIAGNOSIS:  Nuclear sclerotic cataract of the left eye.   OPERATIVE PROCEDURE:@   SURGEON:  Galen Manila, MD.   ANESTHESIA:  Anesthesiologist: Orrin Brigham, MD CRNA: Jimmy Picket, CRNA  1.      Managed anesthesia care. 2.     0.7ml of Shugarcaine was instilled following the paracentesis   COMPLICATIONS:  None.   TECHNIQUE:   Stop and chop   DESCRIPTION OF PROCEDURE:  The patient was examined and consented in the preoperative holding area where the aforementioned topical anesthesia was applied to the left eye and then brought back to the Operating Room where the left eye was prepped and draped in the usual sterile ophthalmic fashion and a lid speculum was placed. A paracentesis was created with the side port blade and the anterior chamber was filled with viscoelastic. A near clear corneal incision was performed with the steel keratome. A continuous curvilinear capsulorrhexis was performed with a cystotome followed by the capsulorrhexis forceps. Hydrodissection and hydrodelineation were carried out with BSS on a blunt cannula. The lens was removed in a stop and chop  technique and the remaining cortical material was removed with the irrigation-aspiration handpiece. The capsular bag was inflated with viscoelastic and the Technis [id number] lens was placed in the capsular bag without complication. The remaining viscoelastic was removed from the eye with the irrigation-aspiration handpiece. The wounds were hydrated. The anterior chamber was flushed with BSS and the eye was inflated to physiologic pressure. 0.1ml Vigamox was placed in the anterior chamber. The wounds were found to be water tight. The eye was dressed with Combigan. The patient was given protective glasses to wear throughout the day and a shield with which to sleep tonight. The patient was also given drops with which to begin a drop regimen today and  will follow-up with me in one day. Implant Name Type Inv. Item Serial No. Manufacturer Lot No. LRB No. Used Action  LENS IOL ACRYSOF IQ 8.5 - [id number] Intraocular Lens LENS IOL ACRYSOF IQ 8.5 [id number] ALCON  Left 1 Wasted    Procedure(s): CATARACT EXTRACTION PHACO AND INTRAOCULAR LENS PLACEMENT (IOC) LEFT (Left) 8.41    00.49.8 Electronically signed: Galen Manila 07/11/2019 1:06 PM

## 2019-07-11 NOTE — Anesthesia Postprocedure Evaluation (Signed)
Anesthesia Post Note  Patient: Shelley Zuniga  Procedure(s) Performed: CATARACT EXTRACTION PHACO AND INTRAOCULAR LENS PLACEMENT (IOC) LEFT 8.41 00:49.8  (Left Eye)     Patient location during evaluation: PACU Anesthesia Type: MAC Level of consciousness: awake and alert Pain management: pain level controlled Vital Signs Assessment: post-procedure vital signs reviewed and stable Respiratory status: spontaneous breathing, nonlabored ventilation, respiratory function stable and patient connected to nasal cannula oxygen Cardiovascular status: stable and blood pressure returned to baseline Postop Assessment: no apparent nausea or vomiting Anesthetic complications: no    Kelliann Pendergraph

## 2019-07-12 ENCOUNTER — Encounter: Payer: Self-pay | Admitting: *Deleted

## 2019-07-17 ENCOUNTER — Other Ambulatory Visit: Payer: Self-pay

## 2019-07-17 ENCOUNTER — Other Ambulatory Visit (INDEPENDENT_AMBULATORY_CARE_PROVIDER_SITE_OTHER): Payer: Medicare PPO

## 2019-07-17 DIAGNOSIS — E559 Vitamin D deficiency, unspecified: Secondary | ICD-10-CM

## 2019-07-17 DIAGNOSIS — E782 Mixed hyperlipidemia: Secondary | ICD-10-CM | POA: Diagnosis not present

## 2019-07-17 DIAGNOSIS — R7301 Impaired fasting glucose: Secondary | ICD-10-CM | POA: Diagnosis not present

## 2019-07-17 DIAGNOSIS — I10 Essential (primary) hypertension: Secondary | ICD-10-CM

## 2019-07-17 LAB — LIPID PANEL
Cholesterol: 164 mg/dL (ref 0–200)
HDL: 66.5 mg/dL (ref >39.00)
LDL Cholesterol: 78 mg/dL (ref 0–99)
NonHDL: 97.96
Total CHOL/HDL Ratio: 2
Triglycerides: 98 mg/dL (ref 0.0–149.0)
VLDL: 19.6 mg/dL (ref 0.0–40.0)

## 2019-07-17 LAB — COMPREHENSIVE METABOLIC PANEL
ALT: 19 U/L (ref 0–35)
AST: 15 U/L (ref 0–37)
Albumin: 4 g/dL (ref 3.5–5.2)
Alkaline Phosphatase: 62 U/L (ref 39–117)
BUN: 14 mg/dL (ref 6–23)
CO2: 32 mEq/L (ref 19–32)
Calcium: 9.7 mg/dL (ref 8.4–10.5)
Chloride: 98 mEq/L (ref 96–112)
Creatinine, Ser: 0.8 mg/dL (ref 0.40–1.20)
GFR: 71.02 mL/min (ref >60.00)
Glucose, Bld: 88 mg/dL (ref 70–99)
Potassium: 3.8 mEq/L (ref 3.5–5.1)
Sodium: 138 mEq/L (ref 135–145)
Total Bilirubin: 0.4 mg/dL (ref 0.2–1.2)
Total Protein: 7.4 g/dL (ref 6.0–8.3)

## 2019-07-17 LAB — MICROALBUMIN / CREATININE URINE RATIO
Creatinine,U: 32.8 mg/dL
Microalb Creat Ratio: 2.1 mg/g (ref 0.0–30.0)
Microalb, Ur: <0.7 mg/dL (ref 0.0–1.9)

## 2019-07-17 LAB — HEMOGLOBIN A1C: Hgb A1c MFr Bld: 6 % (ref 4.6–6.5)

## 2019-07-17 LAB — VITAMIN D 25 HYDROXY (VIT D DEFICIENCY, FRACTURES): VITD: 34.73 ng/mL (ref 30.00–100.00)

## 2019-07-18 ENCOUNTER — Encounter: Payer: Self-pay | Admitting: Ophthalmology

## 2019-07-20 DIAGNOSIS — I10 Essential (primary) hypertension: Secondary | ICD-10-CM | POA: Diagnosis not present

## 2019-07-20 DIAGNOSIS — H2511 Age-related nuclear cataract, right eye: Secondary | ICD-10-CM | POA: Diagnosis not present

## 2019-07-21 ENCOUNTER — Other Ambulatory Visit
Admission: RE | Admit: 2019-07-21 | Discharge: 2019-07-21 | Disposition: A | Discharge status: Home / Self Care | Payer: Medicare PPO | Source: Ambulatory Visit | Attending: Ophthalmology | Admitting: Ophthalmology

## 2019-07-21 DIAGNOSIS — Z20822 Contact with and (suspected) exposure to covid-19: Secondary | ICD-10-CM | POA: Insufficient documentation

## 2019-07-21 DIAGNOSIS — Z01812 Encounter for preprocedural laboratory examination: Secondary | ICD-10-CM | POA: Diagnosis not present

## 2019-07-21 LAB — SARS CORONAVIRUS 2 (TAT 6-24 HRS): SARS Coronavirus 2: NEGATIVE

## 2019-07-24 NOTE — Discharge Instructions (Signed)

## 2019-07-25 ENCOUNTER — Other Ambulatory Visit: Payer: Self-pay

## 2019-07-25 ENCOUNTER — Encounter
Admission: RE | Disposition: A | Discharge status: Home / Self Care | Payer: Self-pay | Source: Ambulatory Visit | Attending: Ophthalmology

## 2019-07-25 ENCOUNTER — Ambulatory Visit: Payer: Medicare PPO | Admitting: Anesthesiology

## 2019-07-25 ENCOUNTER — Encounter: Payer: Self-pay | Admitting: Ophthalmology

## 2019-07-25 ENCOUNTER — Ambulatory Visit
Admission: RE | Admit: 2019-07-25 | Discharge: 2019-07-25 | Disposition: A | Discharge status: Home / Self Care | Payer: Medicare PPO | Source: Ambulatory Visit | Attending: Ophthalmology | Admitting: Ophthalmology

## 2019-07-25 DIAGNOSIS — Z90710 Acquired absence of both cervix and uterus: Secondary | ICD-10-CM | POA: Diagnosis not present

## 2019-07-25 DIAGNOSIS — Z885 Allergy status to narcotic agent status: Secondary | ICD-10-CM | POA: Diagnosis not present

## 2019-07-25 DIAGNOSIS — H2511 Age-related nuclear cataract, right eye: Secondary | ICD-10-CM | POA: Insufficient documentation

## 2019-07-25 DIAGNOSIS — E7800 Pure hypercholesterolemia, unspecified: Secondary | ICD-10-CM | POA: Diagnosis not present

## 2019-07-25 DIAGNOSIS — Z9849 Cataract extraction status, unspecified eye: Secondary | ICD-10-CM | POA: Insufficient documentation

## 2019-07-25 DIAGNOSIS — R011 Cardiac murmur, unspecified: Secondary | ICD-10-CM | POA: Insufficient documentation

## 2019-07-25 DIAGNOSIS — Z882 Allergy status to sulfonamides status: Secondary | ICD-10-CM | POA: Insufficient documentation

## 2019-07-25 DIAGNOSIS — I10 Essential (primary) hypertension: Secondary | ICD-10-CM | POA: Insufficient documentation

## 2019-07-25 DIAGNOSIS — R519 Headache, unspecified: Secondary | ICD-10-CM | POA: Diagnosis not present

## 2019-07-25 DIAGNOSIS — Z85828 Personal history of other malignant neoplasm of skin: Secondary | ICD-10-CM | POA: Diagnosis not present

## 2019-07-25 DIAGNOSIS — F419 Anxiety disorder, unspecified: Secondary | ICD-10-CM | POA: Diagnosis not present

## 2019-07-25 DIAGNOSIS — M5432 Sciatica, left side: Secondary | ICD-10-CM | POA: Diagnosis not present

## 2019-07-25 DIAGNOSIS — Z9049 Acquired absence of other specified parts of digestive tract: Secondary | ICD-10-CM | POA: Diagnosis not present

## 2019-07-25 DIAGNOSIS — R42 Dizziness and giddiness: Secondary | ICD-10-CM | POA: Diagnosis not present

## 2019-07-25 DIAGNOSIS — H25811 Combined forms of age-related cataract, right eye: Secondary | ICD-10-CM | POA: Diagnosis not present

## 2019-07-25 HISTORY — PX: CATARACT EXTRACTION W/PHACO: SHX586

## 2019-07-25 SURGERY — PHACOEMULSIFICATION, CATARACT, WITH IOL INSERTION
Anesthesia: Monitor Anesthesia Care | Site: Eye | Laterality: Right

## 2019-07-25 MED ORDER — NA CHONDROIT SULF-NA HYALURON 40-17 MG/ML IO SOLN
INTRAOCULAR | Status: DC | PRN
  Administered 2019-07-25: 1 mL via INTRAOCULAR

## 2019-07-25 MED ORDER — FENTANYL CITRATE (PF) 100 MCG/2ML IJ SOLN
INTRAMUSCULAR | Status: DC | PRN
  Administered 2019-07-25: 50 ug via INTRAVENOUS

## 2019-07-25 MED ORDER — MOXIFLOXACIN HCL 0.5 % OP SOLN
OPHTHALMIC | Status: DC | PRN
  Administered 2019-07-25: 0.2 mL via OPHTHALMIC

## 2019-07-25 MED ORDER — BRIMONIDINE TARTRATE-TIMOLOL 0.2-0.5 % OP SOLN
OPHTHALMIC | Status: DC | PRN
  Administered 2019-07-25: 1 [drp] via OPHTHALMIC

## 2019-07-25 MED ORDER — EPINEPHRINE PF 1 MG/ML IJ SOLN
INTRAOCULAR | Status: DC | PRN
  Administered 2019-07-25: 82 mL via OPHTHALMIC

## 2019-07-25 MED ORDER — ARMC OPHTHALMIC DILATING DROPS
1.0000 "application " | OPHTHALMIC | Status: DC | PRN
  Administered 2019-07-25 (×3): 1 via OPHTHALMIC

## 2019-07-25 MED ORDER — LACTATED RINGERS IV SOLN: 100.0000 mL/h | INTRAVENOUS | Status: DC

## 2019-07-25 MED ORDER — LACTATED RINGERS IV SOLN: 10.0000 mL/h | INTRAVENOUS | Status: DC

## 2019-07-25 MED ORDER — LIDOCAINE HCL (PF) 2 % IJ SOLN
INTRAOCULAR | Status: DC | PRN
  Administered 2019-07-25: 1 mL

## 2019-07-25 MED ORDER — TETRACAINE HCL 0.5 % OP SOLN
1.0000 [drp] | OPHTHALMIC | Status: DC | PRN
  Administered 2019-07-25 (×3): 1 [drp] via OPHTHALMIC

## 2019-07-25 MED ORDER — MIDAZOLAM HCL 2 MG/2ML IJ SOLN
INTRAMUSCULAR | Status: DC | PRN
  Administered 2019-07-25: 1.5 mg via INTRAVENOUS

## 2019-07-25 SURGICAL SUPPLY — 24 items
CANNULA ANT/CHMB 27G (MISCELLANEOUS) ×2 IMPLANT
CANNULA ANT/CHMB 27GA (MISCELLANEOUS) ×6 IMPLANT
GLOVE SURG LX 8.0 MICRO (GLOVE) ×4
GLOVE SURG LX STRL 8.0 MICRO (GLOVE) ×1 IMPLANT
GLOVE SURG TRIUMPH 8.0 PF LTX (GLOVE) ×3 IMPLANT
GOWN STRL REUS W/ TWL LRG LVL3 (GOWN DISPOSABLE) ×2 IMPLANT
GOWN STRL REUS W/TWL LRG LVL3 (GOWN DISPOSABLE) ×4
LENS IOL ACRSF IQ TRC 4 11.5 IMPLANT
LENS IOL ACRYSOF IQ TORIC 11.5 ×2 IMPLANT
LENS IOL IQ TORIC 4 11.5 ×1 IMPLANT
MARKER SKIN DUAL TIP RULER LAB (MISCELLANEOUS) ×3 IMPLANT
NDL FILTER BLUNT 18X1 1/2 (NEEDLE) ×1 IMPLANT
NDL RETROBULBAR .5 NSTRL (NEEDLE) ×3 IMPLANT
NEEDLE FILTER BLUNT 18X 1/2SAF (NEEDLE) ×2
NEEDLE FILTER BLUNT 18X1 1/2 (NEEDLE) ×1 IMPLANT
PACK EYE AFTER SURG (MISCELLANEOUS) ×3 IMPLANT
PACK OPTHALMIC (MISCELLANEOUS) ×3 IMPLANT
PACK PORFILIO (MISCELLANEOUS) ×3 IMPLANT
SUT ETHILON 10-0 CS-B-6CS-B-6 (SUTURE)
SUTURE EHLN 10-0 CS-B-6CS-B-6 (SUTURE) IMPLANT
SYR 3ML LL SCALE MARK (SYRINGE) ×3 IMPLANT
SYR TB 1ML LUER SLIP (SYRINGE) ×3 IMPLANT
WATER STERILE IRR 250ML POUR (IV SOLUTION) ×3 IMPLANT
WIPE NON LINTING 3.25X3.25 (MISCELLANEOUS) ×3 IMPLANT

## 2019-07-25 NOTE — Transfer of Care (Signed)
Immediate Anesthesia Transfer of Care Note  Patient: Shelley Zuniga  Procedure(s) Performed: CATARACT EXTRACTION PHACO AND INTRAOCULAR LENS PLACEMENT (IOC) RIGHT TORIC LENS (Right Eye)  Patient Location: PACU  Anesthesia Type: MAC  Level of Consciousness: awake, alert  and patient cooperative  Airway and Oxygen Therapy: Patient Spontanous Breathing and Patient connected to supplemental oxygen  Post-op Assessment: Post-op Vital signs reviewed, Patient's Cardiovascular Status Stable, Respiratory Function Stable, Patent Airway and No signs of Nausea or vomiting  Post-op Vital Signs: Reviewed and stable  Complications: No apparent anesthesia complications

## 2019-07-25 NOTE — Anesthesia Procedure Notes (Signed)
Procedure Name: MAC Performed by: Randalyn Ahmed, CRNA Pre-anesthesia Checklist: Patient identified, Emergency Drugs available, Suction available, Timeout performed and Patient being monitored Patient Re-evaluated:Patient Re-evaluated prior to induction Oxygen Delivery Method: Nasal cannula Placement Confirmation: positive ETCO2       

## 2019-07-25 NOTE — H&P (Signed)
All labs reviewed. Abnormal studies sent to patients PCP when indicated.  Previous H&P reviewed, patient examined, there are NO CHANGES.  Shelley Manville Porfilio3/9/20218:50 AM

## 2019-07-25 NOTE — Anesthesia Preprocedure Evaluation (Signed)
Anesthesia Evaluation  Patient identified by MRN, date of birth, ID band Patient awake    Reviewed: Allergy & Precautions, H&P , NPO status , Patient's Chart, lab work & pertinent test results  History of Anesthesia Complications (+) PONV and history of anesthetic complications  Airway Mallampati: II  TM Distance: >3 FB Neck ROM: full    Dental no notable dental hx.    Pulmonary    Pulmonary exam normal breath sounds clear to auscultation       Cardiovascular Exercise Tolerance: Good hypertension, Normal cardiovascular exam+ Valvular Problems/Murmurs (benign murmur)  Rhythm:regular Rate:Normal     Neuro/Psych  Headaches, Anxiety Sciatica L leg  negative psych ROS   GI/Hepatic negative GI ROS, Neg liver ROS,   Endo/Other  negative endocrine ROS  Renal/GU negative Renal ROS  negative genitourinary   Musculoskeletal   Abdominal   Peds  Hematology negative hematology ROS (+)   Anesthesia Other Findings Covid; NEG.  Reproductive/Obstetrics                             Anesthesia Physical  Anesthesia Plan  ASA: II  Anesthesia Plan: MAC   Post-op Pain Management:    Induction:   PONV Risk Score and Plan: 3 and Treatment may vary due to age or medical condition, TIVA and Midazolam  Airway Management Planned:   Additional Equipment:   Intra-op Plan:   Post-operative Plan:   Informed Consent: I have reviewed the patients History and Physical, chart, labs and discussed the procedure including the risks, benefits and alternatives for the proposed anesthesia with the patient or authorized representative who has indicated his/her understanding and acceptance.     Dental Advisory Given  Plan Discussed with: CRNA  Anesthesia Plan Comments:         Anesthesia Quick Evaluation

## 2019-07-25 NOTE — Op Note (Signed)
PREOPERATIVE DIAGNOSIS:  Nuclear sclerotic cataract of the right eye.   POSTOPERATIVE DIAGNOSIS:  Nuclear sclerotic cataract of the right eye.   OPERATIVE PROCEDURE: Procedure(s): CATARACT EXTRACTION PHACO AND INTRAOCULAR LENS PLACEMENT (IOC) RIGHT TORIC LENS   SURGEON:  Galen Manila, MD.   ANESTHESIA: 1.      Managed anesthesia care. 2.     0.22ml of Shugarcaine was instilled following the paracentesis  Anesthesiologist: Ranee Gosselin, MD CRNA: Jimmy Picket, CRNA  COMPLICATIONS:  None.   TECHNIQUE:   Stop and chop    DESCRIPTION OF PROCEDURE:  The patient was examined and consented in the preoperative holding area where the aforementioned topical anesthesia was applied to the right eye.  The patient was brought back to the Operating Room where he was sat upright on the gurney and given a target to fixate upon while the eye was marked at the 3:00 and 9:00 position.  The patient was then reclined on the operating table.  The eye was prepped and draped in the usual sterile ophthalmic fashion and a lid speculum was placed. A paracentesis was created with the side port blade and the anterior chamber was filled with viscoelastic. A near clear corneal incision was performed with the steel keratome. A continuous curvilinear capsulorrhexis was performed with a cystotome followed by the capsulorrhexis forceps. Hydrodissection and hydrodelineation were carried out with BSS on a blunt cannula. The lens was removed in a stop and chop technique and the remaining cortical material was removed with the irrigation-aspiration handpiece. The eye was inflated with viscoelastic and the [id number]  lens  was placed in the eye and rotated to within a few degrees of the predetermined orientation.  The remaining viscoelastic was removed from the eye.  The Sinskey hook was used to rotate the toric lens into its final resting place at 170 degrees.  0. The eye was inflated to a physiologic pressure and found to be  watertight. 0.37ml of Vigamox was placed in the anterior chamber.  The eye was dressed with Vigamox. The patient was given protective glasses to wear throughout the day and a shield with which to sleep tonight. The patient was also given drops with which to begin a drop regimen today and will follow-up with me in one day. Implant Name Type Inv. Item Serial No. Manufacturer Lot No. LRB No. Used Action  LENS IOL TORIC 11.5 - [id number]  LENS IOL TORIC 11.5 [id number] ALCON  Right 1 Implanted   Procedure(s) with comments: CATARACT EXTRACTION PHACO AND INTRAOCULAR LENS PLACEMENT (IOC) RIGHT TORIC LENS (Right) - CDE 4.84 U/S 0:35.7  Electronically signed: Galen Manila 07/25/2019 9:19 AM

## 2019-07-25 NOTE — H&P (Signed)
All labs reviewed. Abnormal studies sent to patients PCP when indicated.  Previous H&P reviewed, patient examined, there are NO CHANGES.  Shelley Leeper Porfilio3/9/20218:44 AM

## 2019-07-25 NOTE — Anesthesia Postprocedure Evaluation (Signed)
Anesthesia Post Note  Patient: Shelley Zuniga  Procedure(s) Performed: CATARACT EXTRACTION PHACO AND INTRAOCULAR LENS PLACEMENT (IOC) RIGHT TORIC LENS (Right Eye)     Patient location during evaluation: PACU Anesthesia Type: MAC Level of consciousness: awake and alert and oriented Pain management: satisfactory to patient Vital Signs Assessment: post-procedure vital signs reviewed and stable Respiratory status: spontaneous breathing, nonlabored ventilation and respiratory function stable Cardiovascular status: blood pressure returned to baseline and stable Postop Assessment: Adequate PO intake and No signs of nausea or vomiting Anesthetic complications: no    Raliegh Ip

## 2019-07-26 ENCOUNTER — Encounter: Payer: Self-pay | Admitting: *Deleted

## 2019-08-10 DIAGNOSIS — M19049 Primary osteoarthritis, unspecified hand: Secondary | ICD-10-CM | POA: Diagnosis not present

## 2019-08-23 ENCOUNTER — Other Ambulatory Visit: Payer: Self-pay | Admitting: Internal Medicine

## 2019-09-26 DIAGNOSIS — Z961 Presence of intraocular lens: Secondary | ICD-10-CM | POA: Diagnosis not present

## 2019-10-12 ENCOUNTER — Ambulatory Visit (INDEPENDENT_AMBULATORY_CARE_PROVIDER_SITE_OTHER): Payer: Medicare PPO | Admitting: Internal Medicine

## 2019-10-12 ENCOUNTER — Encounter: Payer: Self-pay | Admitting: Internal Medicine

## 2019-10-12 ENCOUNTER — Other Ambulatory Visit: Payer: Self-pay

## 2019-10-12 VITALS — BP 132/80 | HR 84 | Temp 97.5°F | Ht 64.0 in | Wt 166.6 lb

## 2019-10-12 DIAGNOSIS — N390 Urinary tract infection, site not specified: Secondary | ICD-10-CM | POA: Diagnosis not present

## 2019-10-12 DIAGNOSIS — N3000 Acute cystitis without hematuria: Secondary | ICD-10-CM

## 2019-10-12 MED ORDER — CIPROFLOXACIN HCL 500 MG PO TABS: 500.0000 mg | ORAL_TABLET | Freq: Two times a day (BID) | ORAL | 0 refills | Status: DC

## 2019-10-12 NOTE — Progress Notes (Signed)
Chief Complaint  Patient presents with  . Urinary Tract Infection    onset of this past tuesday. Pt having frequency and pelvic pain. Pain extending to arms and fingers.    Acute visit  1. Recurrent UTI she reports when she gets UTI her arms tingle, she is having urgency, dysuria, frequency and lower ab pain/pressure like her cycle is about to start. On cranberry pills tried to increase water and using orally estrace pills since her hysterectomy. She has had E coli UTI in the past  Sx's started Tuesday with frequency but since sx's have gotten worse   Review of Systems  Respiratory: Negative for shortness of breath.   Gastrointestinal: Positive for abdominal pain.  Genitourinary: Positive for dysuria, frequency and urgency.   Past Medical History:  Diagnosis Date  . Colon polyp 2001   colonoscopy, [facility]   . Headache    regularly, not migraines  . Heart murmur   . Hypertension   . Motion sickness   . PONV (postoperative nausea and vomiting)   . Strabismus age 69   eye surgery  . Wears hearing aid in both ears    Past Surgical History:  Procedure Laterality Date  . ABDOMINAL HYSTERECTOMY  2000   [place], [facility], fibroids/menorrhagia  . BASAL CELL CARCINOMA EXCISION Left 2013   Dr. Orson Aloe  . BILATERAL OOPHORECTOMY  2000   fibroid cysts  . CATARACT EXTRACTION W/PHACO Left 07/11/2019   Procedure: CATARACT EXTRACTION PHACO AND INTRAOCULAR LENS PLACEMENT (IOC) LEFT 8.41 00:49.8 ;  Surgeon: Galen Manila, MD;  Location: [facility];  Service: Ophthalmology;  Laterality: Left;  . CATARACT EXTRACTION W/PHACO Right 07/25/2019   Procedure: CATARACT EXTRACTION PHACO AND INTRAOCULAR LENS PLACEMENT (IOC) RIGHT TORIC LENS;  Surgeon: Galen Manila, MD;  Location: [facility];  Service: Ophthalmology;  Laterality: Right;  CDE 4.84 U/S 0:35.7  . EYE SURGERY  1956   to correct strabismus  . GALLBLADDER SURGERY    . HEMORRHOID SURGERY     Family History  Problem  Relation Age of Onset  . Cancer Mother        melanoma, squamous cell on nose  . Cancer Father        lung, tobacco user  . Cancer Brother        throat, tobacco user  . Cancer Maternal Grandmother        uterine/ovarian   Social History   Socioeconomic History  . Marital status: Married    Spouse name: Not on file  . Number of children: Not on file  . Years of education: Not on file  . Highest education level: Not on file  Occupational History  . Not on file  Tobacco Use  . Smoking status: Never Smoker  . Smokeless tobacco: Never Used  Substance and Sexual Activity  . Alcohol use: No  . Drug use: No  . Sexual activity: Yes    Birth control/protection: Post-menopausal  Other Topics Concern  . Not on file  Social History Narrative  . Not on file   Social Determinants of Health   Financial Resource Strain:   . Difficulty of Paying Living Expenses:   Food Insecurity:   . Worried About Programme researcher, broadcasting/film/video in the Last Year:   . Barista in the Last Year:   Transportation Needs:   . Freight forwarder (Medical):   Marland Kitchen Lack of Transportation (Non-Medical):   Physical Activity:   . Days of Exercise per Week:   .  Minutes of Exercise per Session:   Stress:   . Feeling of Stress :   Social Connections:   . Frequency of Communication with Friends and Family:   . Frequency of Social Gatherings with Friends and Family:   . Attends Religious Services:   . Active Member of Clubs or Organizations:   . Attends Archivist Meetings:   Marland Kitchen Marital Status:   Intimate Partner Violence:   . Fear of Current or Ex-Partner:   . Emotionally Abused:   Marland Kitchen Physically Abused:   . Sexually Abused:    Current Meds  Medication Sig  . ALPRAZolam (XANAX) 0.25 MG tablet Take 1 tablet (0.25 mg total) by mouth at bedtime as needed for anxiety.  Marland Kitchen amLODipine (NORVASC) 5 MG tablet Take 1 tablet (5 mg total) by mouth daily.  Marland Kitchen aspirin 81 MG tablet Take 81 mg by mouth daily.     Marland Kitchen atorvastatin (LIPITOR) 20 MG tablet TAKE 1 TABLET(20 MG) BY MOUTH DAILY  . buPROPion (WELLBUTRIN SR) 100 MG 12 hr tablet TAKE 1 TABLET BY MOUTH TWICE DAILY  . ciprofloxacin (CIPRO) 500 MG tablet Take 1 tablet (500 mg total) by mouth 2 (two) times daily. With food  . Cranberry 500 MG CAPS   . Docusate Calcium (STOOL SOFTENER PO) Take by mouth.    . estradiol (ESTRACE) 1 MG tablet Take 1 mg by mouth daily.    . fluocinonide cream (LIDEX) 1.96 % Apply 1 application topically as needed.  . fluticasone (FLONASE) 50 MCG/ACT nasal spray SPRAY 2 SPRAYS IN EACH NOSTRIL EVERY DAY  . hydrocortisone cream 0.5 % Apply 1 application topically 2 (two) times daily as needed for itching.  . losartan-hydrochlorothiazide (HYZAAR) 100-25 MG tablet Take 1 tablet by mouth daily.  Marland Kitchen nystatin (NYSTATIN) powder Apply 1 application topically as needed.  Marland Kitchen PREVIDENT 5000 DRY MOUTH 1.1 % GEL dental gel   . VITAMIN D PO Take 400 mg by mouth daily.  . vitamin E 400 UNIT capsule Take 400 Units by mouth daily.    . Zinc 25 MG TABS Take 1 tablet by mouth.   Current Facility-Administered Medications for the 10/12/19 encounter (Office Visit) with McLean-Scocuzza, Nino Glow, MD  Medication  . methylPREDNISolone acetate (DEPO-MEDROL) injection 40 mg   Allergies  Allergen Reactions  . Morphine And Related Itching  . Sulfa Antibiotics Swelling    Tongue   Recent Results (from the past 2160 hour(s))  Microalbumin / creatinine urine ratio     Status: None   Collection Time: 07/17/19  1:56 PM  Result Value Ref Range   Microalb, Ur <0.7 0.0 - 1.9 mg/dL   Creatinine,U 32.8 mg/dL   Microalb Creat Ratio 2.1 0.0 - 30.0 mg/g  VITAMIN D 25 Hydroxy (Vit-D Deficiency, Fractures)     Status: None   Collection Time: 07/17/19  1:56 PM  Result Value Ref Range   VITD 34.73 30.00 - 100.00 ng/mL  Lipid panel     Status: None   Collection Time: 07/17/19  1:56 PM  Result Value Ref Range   Cholesterol 164 0 - 200 mg/dL    Comment: ATP  III Classification       Desirable:  < 200 mg/dL               Borderline High:  200 - 239 mg/dL          High:  > = 240 mg/dL   Triglycerides 98.0 0.0 - 149.0 mg/dL    Comment: Normal:  <  150 mg/dLBorderline High:  150 - 199 mg/dL   HDL 14.48 >18.56 mg/dL   VLDL 31.4 0.0 - 97.0 mg/dL   LDL Cholesterol 78 0 - 99 mg/dL   Total CHOL/HDL Ratio 2     Comment:                Men          Women1/2 Average Risk     3.4          3.3Average Risk          5.0          4.42X Average Risk          9.6          7.13X Average Risk          15.0          11.0                       NonHDL 97.96     Comment: NOTE:  Non-HDL goal should be 30 mg/dL higher than patient's LDL goal (i.e. LDL goal of < 70 mg/dL, would have non-HDL goal of < 100 mg/dL)  Comprehensive metabolic panel     Status: None   Collection Time: 07/17/19  1:56 PM  Result Value Ref Range   Sodium 138 135 - 145 mEq/L   Potassium 3.8 3.5 - 5.1 mEq/L   Chloride 98 96 - 112 mEq/L   CO2 32 19 - 32 mEq/L   Glucose, Bld 88 70 - 99 mg/dL   BUN 14 6 - 23 mg/dL   Creatinine, Ser 2.63 0.40 - 1.20 mg/dL   Total Bilirubin 0.4 0.2 - 1.2 mg/dL   Alkaline Phosphatase 62 39 - 117 U/L   AST 15 0 - 37 U/L   ALT 19 0 - 35 U/L   Total Protein 7.4 6.0 - 8.3 g/dL   Albumin 4.0 3.5 - 5.2 g/dL   GFR 78.58 >85.02 mL/min   Calcium 9.7 8.4 - 10.5 mg/dL  Hemoglobin D7A     Status: None   Collection Time: 07/17/19  1:56 PM  Result Value Ref Range   Hgb A1c MFr Bld 6.0 4.6 - 6.5 %    Comment: Glycemic Control Guidelines for People with Diabetes:Non Diabetic:  <6%Goal of Therapy: <7%Additional Action Suggested:  >8%   SARS CORONAVIRUS 2 (TAT 6-24 HRS) Nasopharyngeal Nasopharyngeal Swab     Status: None   Collection Time: 07/21/19 11:36 AM   Specimen: Nasopharyngeal Swab  Result Value Ref Range   SARS Coronavirus 2 NEGATIVE NEGATIVE    Comment: (NOTE) SARS-CoV-2 target nucleic acids are NOT DETECTED. The SARS-CoV-2 RNA is generally detectable in upper and  lower respiratory specimens during the acute phase of infection. Negative results do not preclude SARS-CoV-2 infection, do not rule out co-infections with other pathogens, and should not be used as the sole basis for treatment or other patient management decisions. Negative results must be combined with clinical observations, patient history, and epidemiological information. The expected result is Negative. Fact Sheet for Patients: HairSlick.no Fact Sheet for Healthcare Providers: quierodirigir.com This test is not yet approved or cleared by the [place] FDA and  has been authorized for detection and/or diagnosis of SARS-CoV-2 by FDA under an Emergency Use Authorization (EUA). This EUA will remain  in effect (meaning this test can be used) for the duration of the COVID-19 declaration under Section 56 4(b)(1) of the Act, 21 U.S.C.  section 360bbb-3(b)(1), unless the authorization is terminated or revoked sooner. Performed at [facility], [address], [place], [place] [address]    Objective  Body mass index is 28.6 kg/m. Wt Readings from Last 3 Encounters:  10/12/19 166 lb 9.6 oz (75.6 kg)  07/25/19 167 lb (75.8 kg)  07/11/19 167 lb (75.8 kg)   Temp Readings from Last 3 Encounters:  10/12/19 (!) 97.5 F (36.4 C)  07/25/19 97.8 F (36.6 C)  07/11/19 97.7 F (36.5 C)   BP Readings from Last 3 Encounters:  10/12/19 132/80  07/25/19 116/77  07/11/19 125/74   Pulse Readings from Last 3 Encounters:  10/12/19 84  07/25/19 69  07/11/19 80    Physical Exam Vitals and nursing note reviewed.  Constitutional:      Appearance: Normal appearance. She is well-developed and well-groomed.  HENT:     Head: Normocephalic and atraumatic.  Eyes:     Conjunctiva/sclera: Conjunctivae normal.     Pupils: Pupils are equal, round, and reactive to light.  Cardiovascular:     Rate and Rhythm: Normal rate and regular  rhythm.     Heart sounds: Normal heart sounds. No murmur.  Pulmonary:     Effort: Pulmonary effort is normal.     Breath sounds: Normal breath sounds.  Abdominal:     General: Abdomen is flat. Bowel sounds are normal.     Tenderness: There is no abdominal tenderness. There is no right CVA tenderness or left CVA tenderness.  Skin:    General: Skin is warm and dry.  Neurological:     General: No focal deficit present.     Mental Status: She is alert and oriented to person, place, and time. Mental status is at baseline.     Gait: Gait normal.  Psychiatric:        Attention and Perception: Attention and perception normal.        Mood and Affect: Mood and affect normal.        Speech: Speech normal.        Behavior: Behavior normal. Behavior is cooperative.        Thought Content: Thought content normal.        Cognition and Memory: Cognition and memory normal.        Judgment: Judgment normal.     Assessment  Plan  Acute cystitis without hematuria - Plan: Urinalysis, Routine w reflex microscopic, Urine Culture, Urine Culture, Urinalysis, Routine w reflex microscopic, ciprofloxacin (CIPRO) 500 MG tablet bid x 5 days   Recurrent UTI  Disc d mannose with cranberry supplement  Proper wiping  Hydration  Consider urology if continues to happen Provider: Dr. French Ana McLean-Scocuzza-Internal Medicine

## 2019-10-14 LAB — URINALYSIS, ROUTINE W REFLEX MICROSCOPIC
Bilirubin Urine: NEGATIVE
Glucose, UA: NEGATIVE
Hyaline Cast: NONE SEEN /LPF
Ketones, ur: NEGATIVE
Nitrite: POSITIVE — AB
Specific Gravity, Urine: 1.02 (ref 1.001–1.03)
WBC, UA: >=60 /HPF — AB (ref 0–5)
pH: <=5 (ref 5.0–8.0)

## 2019-10-14 LAB — URINE CULTURE
MICRO NUMBER:: 10527476
SPECIMEN QUALITY:: ADEQUATE

## 2019-10-20 DIAGNOSIS — Z01419 Encounter for gynecological examination (general) (routine) without abnormal findings: Secondary | ICD-10-CM | POA: Diagnosis not present

## 2019-11-17 DIAGNOSIS — Z1231 Encounter for screening mammogram for malignant neoplasm of breast: Secondary | ICD-10-CM | POA: Diagnosis not present

## 2019-11-17 LAB — HM MAMMOGRAPHY

## 2019-11-23 ENCOUNTER — Other Ambulatory Visit: Payer: Self-pay | Admitting: Internal Medicine

## 2019-12-13 ENCOUNTER — Other Ambulatory Visit: Payer: Self-pay | Admitting: Internal Medicine

## 2019-12-18 ENCOUNTER — Encounter: Payer: Self-pay | Admitting: Internal Medicine

## 2019-12-18 ENCOUNTER — Ambulatory Visit (INDEPENDENT_AMBULATORY_CARE_PROVIDER_SITE_OTHER): Payer: Medicare PPO | Admitting: Internal Medicine

## 2019-12-18 ENCOUNTER — Other Ambulatory Visit: Payer: Self-pay

## 2019-12-18 VITALS — BP 120/86 | Resp 15 | Ht 64.0 in | Wt 166.0 lb

## 2019-12-18 DIAGNOSIS — R7303 Prediabetes: Secondary | ICD-10-CM

## 2019-12-18 DIAGNOSIS — E538 Deficiency of other specified B group vitamins: Secondary | ICD-10-CM

## 2019-12-18 DIAGNOSIS — G63 Polyneuropathy in diseases classified elsewhere: Secondary | ICD-10-CM

## 2019-12-18 DIAGNOSIS — E782 Mixed hyperlipidemia: Secondary | ICD-10-CM | POA: Diagnosis not present

## 2019-12-18 DIAGNOSIS — E559 Vitamin D deficiency, unspecified: Secondary | ICD-10-CM | POA: Diagnosis not present

## 2019-12-18 DIAGNOSIS — Z1211 Encounter for screening for malignant neoplasm of colon: Secondary | ICD-10-CM

## 2019-12-18 DIAGNOSIS — I10 Essential (primary) hypertension: Secondary | ICD-10-CM | POA: Diagnosis not present

## 2019-12-18 DIAGNOSIS — F339 Major depressive disorder, recurrent, unspecified: Secondary | ICD-10-CM | POA: Diagnosis not present

## 2019-12-18 DIAGNOSIS — M1811 Unilateral primary osteoarthritis of first carpometacarpal joint, right hand: Secondary | ICD-10-CM | POA: Insufficient documentation

## 2019-12-18 DIAGNOSIS — M1812 Unilateral primary osteoarthritis of first carpometacarpal joint, left hand: Secondary | ICD-10-CM | POA: Diagnosis not present

## 2019-12-18 NOTE — Assessment & Plan Note (Signed)
Well controlled on current regimen. Renal function stable, no changes today. 

## 2019-12-18 NOTE — Assessment & Plan Note (Signed)
LDL and triglycerides have been at goal on current medications. She has no side effects and liver enzymes remain normal on surveillance    Lab Results  Component Value Date   CHOL 164 07/17/2019   HDL 66.50 07/17/2019   LDLCALC 78 07/17/2019   LDLDIRECT 72.0 05/15/2016   TRIG 98.0 07/17/2019   CHOLHDL 2 07/17/2019   Lab Results  Component Value Date   ALT 19 07/17/2019   AST 15 07/17/2019   ALKPHOS 62 07/17/2019   BILITOT 0.4 07/17/2019

## 2019-12-18 NOTE — Assessment & Plan Note (Signed)
Advised to try diclofenac gel and salon pas with lidocaine . Follow up with Dr patel

## 2019-12-18 NOTE — Progress Notes (Addendum)
Subjective:  Patient ID: Shelley Zuniga, female    DOB: [birth year]  Age: 69 y.o. MRN: [id number]  CC: The primary encounter diagnosis was Colon cancer screening. Diagnoses of B12 neuropathy (HCC), Major depressive disorder, recurrent episode with melancholic features (HCC), Vitamin D deficiency, Prediabetes, Mixed hyperlipidemia, Essential hypertension, Osteoarthritis of thumb, left, and Polyneuropathy in other diseases classified elsewhere [facility]) were also pertinent to this visit.  HPI ZALIKA TIESZEN presents for follow up on chronic issues  This visit occurred during the SARS-CoV-2 public health emergency.  Safety protocols were in place, including screening questions prior to the visit, additional usage of staff PPE, and extensive cleaning of exam room while observing appropriate contact time as indicated for disinfecting solutions.   H/o depression. Treatment started initially by GYN over 5 years ago.  still talking wellbutrin.Marland Kitchen took a 2 year break from medication several years ago but symptoms returned.  Taking care of ailing mother  Stressful.  Still feels that the medication is keeping symptoms from returning.   Left thumb still painful..  Weak grip.  Dr Renard Matter  Has left [facility] no prior thumb injection. Diagnosed as OA.  Exercises diclofenac 1% gel recommended but she has been using aspercreme instead.  wonders if she should see the new rheum  Dr patel   Hypertension: patient checks blood pressure twice weekly at home.  Readings have been for the most part <130/80  at rest . Patient is following a reduce salt diet most days and is taking medications as prescribed  Outpatient Medications Prior to Visit  Medication Sig Dispense Refill  . ALPRAZolam (XANAX) 0.25 MG tablet Take 1 tablet (0.25 mg total) by mouth at bedtime as needed for anxiety. 30 tablet 5  . amLODipine (NORVASC) 5 MG tablet TAKE 1 TABLET(5 MG) BY MOUTH DAILY 90 tablet 3  . aspirin 81 MG tablet Take 81 mg by mouth daily.       Marland Kitchen atorvastatin (LIPITOR) 20 MG tablet TAKE 1 TABLET(20 MG) BY MOUTH DAILY 90 tablet 3  . buPROPion (WELLBUTRIN SR) 100 MG 12 hr tablet TAKE 1 TABLET BY MOUTH TWICE DAILY 180 tablet 3  . ciprofloxacin (CIPRO) 500 MG tablet Take 1 tablet (500 mg total) by mouth 2 (two) times daily. With food 10 tablet 0  . Cranberry 500 MG CAPS     . Docusate Calcium (STOOL SOFTENER PO) Take by mouth.      . estradiol (ESTRACE) 1 MG tablet Take 1 mg by mouth daily.      . fluocinonide cream (LIDEX) 0.05 % Apply 1 application topically as needed.    . fluticasone (FLONASE) 50 MCG/ACT nasal spray SPRAY 2 SPRAYS IN EACH NOSTRIL EVERY DAY 48 g 0  . hydrocortisone cream 0.5 % Apply 1 application topically 2 (two) times daily as needed for itching.    . losartan-hydrochlorothiazide (HYZAAR) 100-25 MG tablet Take 1 tablet by mouth daily. 90 tablet 3  . PREVIDENT 5000 DRY MOUTH 1.1 % GEL dental gel     . VITAMIN D PO Take 400 mg by mouth daily.    . vitamin E 400 UNIT capsule Take 400 Units by mouth daily.       Facility-Administered Medications Prior to Visit  Medication Dose Route Frequency Provider Last Rate Last Admin  . methylPREDNISolone acetate (DEPO-MEDROL) injection 40 mg  40 mg Intramuscular Once Sherlene Shams, MD        Review of Systems;  Patient denies headache, fevers, malaise, unintentional weight loss, skin  rash, eye pain, sinus congestion and sinus pain, sore throat, dysphagia,  hemoptysis , cough, dyspnea, wheezing, chest pain, palpitations, orthopnea, edema, abdominal pain, nausea, melena, diarrhea, constipation, flank pain, dysuria, hematuria, urinary  Frequency, nocturia, numbness, tingling, seizures,  Focal weakness, Loss of consciousness,  Tremor, insomnia, depression, anxiety, and suicidal ideation.      Objective:  BP (!) 120/86 (BP Location: Left Arm, Patient Position: Sitting, Cuff Size: Normal)   Resp 15   Ht 5\' 4"  (1.626 m)   Wt 166 lb (75.3 kg)   BMI 28.49 kg/m   BP Readings  from Last 3 Encounters:  12/18/19 (!) 120/86  10/12/19 132/80  07/25/19 116/77    Wt Readings from Last 3 Encounters:  12/18/19 166 lb (75.3 kg)  10/12/19 166 lb 9.6 oz (75.6 kg)  07/25/19 167 lb (75.8 kg)    General appearance: alert, cooperative and appears stated age Ears: normal TM's and external ear canals both ears Throat: lips, mucosa, and tongue normal; teeth and gums normal Neck: no adenopathy, no carotid bruit, supple, symmetrical, trachea midline and thyroid not enlarged, symmetric, no tenderness/mass/nodules Back: symmetric, no curvature. ROM normal. No CVA tenderness. Lungs: clear to auscultation bilaterally Heart: regular rate and rhythm, S1, S2 normal, no murmur, click, rub or gallop Abdomen: soft, non-tender; bowel sounds normal; no masses,  no organomegaly Pulses: 2+ and symmetric Skin: Skin color, texture, turgor normal. No rashes or lesions Lymph nodes: Cervical, supraclavicular, and axillary nodes normal.  Lab Results  Component Value Date   HGBA1C 6.0 07/17/2019   HGBA1C 6.0 01/24/2019   HGBA1C 5.7 05/30/2018    Lab Results  Component Value Date   CREATININE 0.80 07/17/2019   CREATININE 0.86 01/24/2019   CREATININE 0.79 05/30/2018    Lab Results  Component Value Date   WBC 6.2 01/24/2019   HGB 14.1 01/24/2019   HCT 40.9 01/24/2019   PLT 297.0 01/24/2019   GLUCOSE 88 07/17/2019   CHOL 164 07/17/2019   TRIG 98.0 07/17/2019   HDL 66.50 07/17/2019   LDLDIRECT 72.0 05/15/2016   LDLCALC 78 07/17/2019   ALT 19 07/17/2019   AST 15 07/17/2019   NA 138 07/17/2019   K 3.8 07/17/2019   CL 98 07/17/2019   CREATININE 0.80 07/17/2019   BUN 14 07/17/2019   CO2 32 07/17/2019   TSH 0.91 05/30/2018   HGBA1C 6.0 07/17/2019   MICROALBUR <0.7 07/17/2019    No results found.  Assessment & Plan:   Problem List Items Addressed This Visit      Unprioritized   RESOLVED: B12 neuropathy (HCC)   Relevant Orders   Vitamin B12   Hypertension    Well  controlled on current regimen. Renal function stable, no changes today.      Major depressive disorder, recurrent episode with melancholic features (HCC)    Has been managed for over 5 years with wellbutrin.  Symptoms .aggravated by having to spend most time with mother ans controlled with wellbutrin .   No changes today       Mixed hyperlipidemia    LDL and triglycerides have been at goal on current medications. She has no side effects and liver enzymes remain normal on surveillance    Lab Results  Component Value Date   CHOL 164 07/17/2019   HDL 66.50 07/17/2019   LDLCALC 78 07/17/2019   LDLDIRECT 72.0 05/15/2016   TRIG 98.0 07/17/2019   CHOLHDL 2 07/17/2019   Lab Results  Component Value Date   ALT 19 07/17/2019  AST 15 07/17/2019   ALKPHOS 62 07/17/2019   BILITOT 0.4 07/17/2019          Relevant Orders   Lipid panel   Osteoarthritis of thumb, left    Advised to try diclofenac gel and salon pas with lidocaine . Follow up with Dr patel       Polyneuropathy in other diseases classified elsewhere [facility])    Diagnosed in 2015 with b12 deficiency indentified as cause. Symptoms have currently resolved.  Foot exam normal       Vitamin D deficiency   Relevant Orders   VITAMIN D 25 Hydroxy (Vit-D Deficiency, Fractures)    Other Visit Diagnoses    Colon cancer screening    -  Primary   Relevant Orders   Cologuard   Prediabetes       Relevant Orders   Comprehensive metabolic panel   Hemoglobin A1c     A total of 40 minutes was spent with patient more than half of which was spent in counseling patient on the above mentioned issues , reviewing and explaining recent labs and imaging studies done, and coordination of care.  I am having Tabita Corbo. Neva Seat maintain her estradiol, vitamin E, aspirin, Docusate Calcium (STOOL SOFTENER PO), PreviDent 5000 Dry Mouth, Cranberry, VITAMIN D PO, losartan-hydrochlorothiazide, atorvastatin, ALPRAZolam, hydrocortisone cream, fluocinonide  cream, ciprofloxacin, fluticasone, buPROPion, and amLODipine. We will continue to administer methylPREDNISolone acetate.  No orders of the defined types were placed in this encounter.   There are no discontinued medications.  Follow-up: Return in about 4 weeks (around 01/15/2020).   Sherlene Shams, MD

## 2019-12-18 NOTE — Patient Instructions (Addendum)
Your thumb pain is due to osteoarthritis  OA responds to exercise and topical NSAIDS/pain relievers  Diclofenac 1% gel can be applied 4 times daily.    Salon Pas patches with 4% lidocaine can be worn at night while asleep  Dr Allena Katz may be able to inject your joint with steroid if these meds don't help.   Please return in a few weeks for your pneumonia vaccine  cologuard ordered   Fasting labs due in October

## 2019-12-18 NOTE — Assessment & Plan Note (Addendum)
Has been managed for over 5 years with wellbutrin.  Symptoms .aggravated by having to spend most time with mother ans controlled with wellbutrin .   No changes today  

## 2019-12-18 NOTE — Assessment & Plan Note (Addendum)
Diagnosed in 2015 with b12 deficiency indentified as cause. Symptoms have currently resolved.  Foot exam normal

## 2020-01-03 LAB — COLOGUARD: Cologuard: NEGATIVE

## 2020-01-15 DIAGNOSIS — Z1211 Encounter for screening for malignant neoplasm of colon: Secondary | ICD-10-CM | POA: Diagnosis not present

## 2020-01-19 LAB — COLOGUARD: COLOGUARD: NEGATIVE

## 2020-02-05 DIAGNOSIS — R040 Epistaxis: Secondary | ICD-10-CM | POA: Diagnosis not present

## 2020-02-05 DIAGNOSIS — J340 Abscess, furuncle and carbuncle of nose: Secondary | ICD-10-CM | POA: Diagnosis not present

## 2020-02-14 DIAGNOSIS — H903 Sensorineural hearing loss, bilateral: Secondary | ICD-10-CM | POA: Diagnosis not present

## 2020-02-19 DIAGNOSIS — R040 Epistaxis: Secondary | ICD-10-CM | POA: Diagnosis not present

## 2020-02-26 ENCOUNTER — Other Ambulatory Visit: Payer: Self-pay

## 2020-02-26 MED ORDER — LOSARTAN POTASSIUM-HCTZ 100-25 MG PO TABS: 1.0000 | ORAL_TABLET | Freq: Every day | ORAL | 3 refills | Status: DC

## 2020-03-12 ENCOUNTER — Other Ambulatory Visit: Payer: Self-pay | Admitting: Internal Medicine

## 2020-03-12 DIAGNOSIS — E785 Hyperlipidemia, unspecified: Secondary | ICD-10-CM

## 2020-03-13 DIAGNOSIS — H43813 Vitreous degeneration, bilateral: Secondary | ICD-10-CM | POA: Diagnosis not present

## 2020-03-14 ENCOUNTER — Other Ambulatory Visit: Payer: Self-pay

## 2020-03-14 ENCOUNTER — Ambulatory Visit (INDEPENDENT_AMBULATORY_CARE_PROVIDER_SITE_OTHER): Payer: Medicare PPO

## 2020-03-14 DIAGNOSIS — E782 Mixed hyperlipidemia: Secondary | ICD-10-CM | POA: Diagnosis not present

## 2020-03-14 DIAGNOSIS — E538 Deficiency of other specified B group vitamins: Secondary | ICD-10-CM | POA: Diagnosis not present

## 2020-03-14 DIAGNOSIS — Z1159 Encounter for screening for other viral diseases: Secondary | ICD-10-CM | POA: Diagnosis not present

## 2020-03-14 DIAGNOSIS — E559 Vitamin D deficiency, unspecified: Secondary | ICD-10-CM

## 2020-03-14 DIAGNOSIS — G63 Polyneuropathy in diseases classified elsewhere: Secondary | ICD-10-CM

## 2020-03-14 DIAGNOSIS — Z23 Encounter for immunization: Secondary | ICD-10-CM

## 2020-03-14 DIAGNOSIS — R7303 Prediabetes: Secondary | ICD-10-CM

## 2020-03-14 LAB — COMPREHENSIVE METABOLIC PANEL
ALT: 14 U/L (ref 0–35)
AST: 12 U/L (ref 0–37)
Albumin: 4.1 g/dL (ref 3.5–5.2)
Alkaline Phosphatase: 61 U/L (ref 39–117)
BUN: 14 mg/dL (ref 6–23)
CO2: 31 mEq/L (ref 19–32)
Calcium: 9.1 mg/dL (ref 8.4–10.5)
Chloride: 99 mEq/L (ref 96–112)
Creatinine, Ser: 0.82 mg/dL (ref 0.40–1.20)
GFR: 72.62 mL/min (ref >60.00)
Glucose, Bld: 121 mg/dL — ABNORMAL HIGH (ref 70–99)
Potassium: 4 mEq/L (ref 3.5–5.1)
Sodium: 137 mEq/L (ref 135–145)
Total Bilirubin: 0.7 mg/dL (ref 0.2–1.2)
Total Protein: 6.5 g/dL (ref 6.0–8.3)

## 2020-03-14 LAB — VITAMIN D 25 HYDROXY (VIT D DEFICIENCY, FRACTURES): VITD: 25.98 ng/mL — ABNORMAL LOW (ref 30.00–100.00)

## 2020-03-14 LAB — HEMOGLOBIN A1C: Hgb A1c MFr Bld: 6.1 % (ref 4.6–6.5)

## 2020-03-14 LAB — LIPID PANEL
Cholesterol: 154 mg/dL (ref 0–200)
HDL: 63.6 mg/dL (ref >39.00)
LDL Cholesterol: 61 mg/dL (ref 0–99)
NonHDL: 90.1
Total CHOL/HDL Ratio: 2
Triglycerides: 146 mg/dL (ref 0.0–149.0)
VLDL: 29.2 mg/dL (ref 0.0–40.0)

## 2020-03-14 LAB — VITAMIN B12: Vitamin B-12: 150 pg/mL — ABNORMAL LOW (ref 211–911)

## 2020-03-14 NOTE — Progress Notes (Signed)
Shelley Zuniga presents today for injection per MD orders. Pneumonia 23 injection  administered IM in left Upper Arm. Administration without incident. Patient tolerated well.  Angelique Chevalier,cma

## 2020-03-15 LAB — HEPATITIS C ANTIBODY
Hepatitis C Ab: NONREACTIVE
SIGNAL TO CUT-OFF: 0.01 (ref <1.00)

## 2020-03-18 ENCOUNTER — Other Ambulatory Visit: Payer: Self-pay | Admitting: Internal Medicine

## 2020-03-18 DIAGNOSIS — E538 Deficiency of other specified B group vitamins: Secondary | ICD-10-CM

## 2020-03-18 NOTE — Progress Notes (Signed)
Your B12 is VERY LOW AND NEEDS treatment ASAP.  Please call the office to arrange an injection.   Untreated B12 deficiency can lead to irreversible dementia and neuropathy so it is very important to treat .    Ideally we should correct this with IM injections done weekly until we can test for  intrinsic factor antibodies  , which , if present , would suggest a need for ongoing injections.  Please schedule an RN and and lab visit at your earliest convenience.  You do not need to fast.

## 2020-03-19 ENCOUNTER — Other Ambulatory Visit (INDEPENDENT_AMBULATORY_CARE_PROVIDER_SITE_OTHER): Payer: Medicare PPO

## 2020-03-19 ENCOUNTER — Ambulatory Visit (INDEPENDENT_AMBULATORY_CARE_PROVIDER_SITE_OTHER): Payer: Medicare PPO

## 2020-03-19 ENCOUNTER — Other Ambulatory Visit: Payer: Self-pay

## 2020-03-19 DIAGNOSIS — E538 Deficiency of other specified B group vitamins: Secondary | ICD-10-CM

## 2020-03-19 DIAGNOSIS — Z23 Encounter for immunization: Secondary | ICD-10-CM | POA: Diagnosis not present

## 2020-03-19 MED ORDER — CYANOCOBALAMIN 1000 MCG/ML IJ SOLN
1000.0000 ug | Freq: Once | INTRAMUSCULAR | Status: AC
  Administered 2020-03-19: 1000 ug via INTRAMUSCULAR

## 2020-03-19 NOTE — Progress Notes (Addendum)
Patient presented for B 12 injection to left deltoid, patient voiced no concerns nor showed any signs of distress during injection.  Reviewed.  Dr Scott 

## 2020-03-24 LAB — FOLATE RBC: RBC Folate: 594 ng/mL RBC (ref >280)

## 2020-03-24 LAB — INTRINSIC FACTOR ANTIBODIES: Intrinsic Factor: POSITIVE — AB

## 2020-03-24 NOTE — Progress Notes (Signed)
Your intrinisic factor antibody was positive,  So that means you will need to continue supplementing B12 with injections,  Not switch to oral supplements after 4 weekly doses.    If you would like to self administer,  The nurses can help you become comfortable doing this on your subsequent visits.  Regards,   Duncan Dull, MD

## 2020-03-25 ENCOUNTER — Telehealth: Payer: Self-pay

## 2020-03-25 NOTE — Telephone Encounter (Signed)
LMTCB in regards to lab results.  

## 2020-03-25 NOTE — Telephone Encounter (Signed)
See result note message 

## 2020-03-25 NOTE — Telephone Encounter (Signed)
Patient was returning call about lab results she stated that you could call her on her cell [phone]

## 2020-03-27 ENCOUNTER — Ambulatory Visit (INDEPENDENT_AMBULATORY_CARE_PROVIDER_SITE_OTHER): Payer: Medicare PPO

## 2020-03-27 ENCOUNTER — Other Ambulatory Visit: Payer: Self-pay

## 2020-03-27 DIAGNOSIS — E538 Deficiency of other specified B group vitamins: Secondary | ICD-10-CM | POA: Diagnosis not present

## 2020-03-27 MED ORDER — CYANOCOBALAMIN 1000 MCG/ML IJ SOLN
1000.0000 ug | Freq: Once | INTRAMUSCULAR | Status: AC
  Administered 2020-03-27: 1000 ug via INTRAMUSCULAR

## 2020-03-27 NOTE — Progress Notes (Signed)
Patient presented for B 12 injection to left deltoid, patient voiced no concerns nor showed any signs of distress during injection.  Patient also received a letter stating that her cologuard has been resulted and sent to PCP. She would like to know the results.

## 2020-04-03 ENCOUNTER — Other Ambulatory Visit: Payer: Self-pay

## 2020-04-03 ENCOUNTER — Ambulatory Visit (INDEPENDENT_AMBULATORY_CARE_PROVIDER_SITE_OTHER): Payer: Medicare PPO

## 2020-04-03 DIAGNOSIS — E538 Deficiency of other specified B group vitamins: Secondary | ICD-10-CM

## 2020-04-03 MED ORDER — CYANOCOBALAMIN 1000 MCG/ML IJ SOLN
1000.0000 ug | Freq: Once | INTRAMUSCULAR | Status: AC
  Administered 2020-04-03: 1000 ug via INTRAMUSCULAR

## 2020-04-03 NOTE — Progress Notes (Signed)
Patient presented for B 12 injection to right deltoid, patient voiced no concerns nor showed any signs of distress during injection. 

## 2020-04-10 ENCOUNTER — Ambulatory Visit (INDEPENDENT_AMBULATORY_CARE_PROVIDER_SITE_OTHER): Payer: Medicare PPO

## 2020-04-10 ENCOUNTER — Other Ambulatory Visit: Payer: Self-pay

## 2020-04-10 DIAGNOSIS — E538 Deficiency of other specified B group vitamins: Secondary | ICD-10-CM

## 2020-04-10 MED ORDER — CYANOCOBALAMIN 1000 MCG/ML IJ SOLN
1000.0000 ug | Freq: Once | INTRAMUSCULAR | Status: AC
  Administered 2020-04-10: 1000 ug via INTRAMUSCULAR

## 2020-04-10 NOTE — Progress Notes (Signed)
Patient presented for B 12 injection to left deltoid, patient voiced no concerns nor showed any signs of distress during injection. 

## 2020-05-14 ENCOUNTER — Other Ambulatory Visit: Payer: Self-pay

## 2020-05-14 ENCOUNTER — Ambulatory Visit (INDEPENDENT_AMBULATORY_CARE_PROVIDER_SITE_OTHER): Payer: Medicare PPO

## 2020-05-14 DIAGNOSIS — E538 Deficiency of other specified B group vitamins: Secondary | ICD-10-CM | POA: Diagnosis not present

## 2020-05-14 MED ORDER — CYANOCOBALAMIN 1000 MCG/ML IJ SOLN
1000.0000 ug | Freq: Once | INTRAMUSCULAR | Status: AC
  Administered 2020-05-14: 10:00:00 1000 ug via INTRAMUSCULAR

## 2020-05-14 NOTE — Progress Notes (Signed)
Patient presented for B 12 injection to left deltoid, patient voiced no concerns nor showed any signs of distress during injection. 

## 2020-06-11 ENCOUNTER — Encounter: Payer: Self-pay | Admitting: Internal Medicine

## 2020-06-11 ENCOUNTER — Telehealth (INDEPENDENT_AMBULATORY_CARE_PROVIDER_SITE_OTHER): Payer: Medicare PPO | Admitting: Internal Medicine

## 2020-06-11 DIAGNOSIS — U071 COVID-19: Secondary | ICD-10-CM | POA: Diagnosis not present

## 2020-06-11 DIAGNOSIS — Z8616 Personal history of COVID-19: Secondary | ICD-10-CM | POA: Insufficient documentation

## 2020-06-11 MED ORDER — ALBUTEROL SULFATE HFA 108 (90 BASE) MCG/ACT IN AERS: 2.0000 | INHALATION_SPRAY | Freq: Four times a day (QID) | RESPIRATORY_TRACT | 11 refills | Status: DC | PRN

## 2020-06-11 MED ORDER — BENZONATATE 200 MG PO CAPS: 200.0000 mg | ORAL_CAPSULE | Freq: Three times a day (TID) | ORAL | 1 refills | Status: DC | PRN

## 2020-06-11 MED ORDER — PREDNISONE 10 MG PO TABS: ORAL_TABLET | ORAL | 0 refills | Status: DC

## 2020-06-11 MED ORDER — AZITHROMYCIN 500 MG PO TABS: 500.0000 mg | ORAL_TABLET | Freq: Every day | ORAL | 0 refills | Status: DC

## 2020-06-11 NOTE — Patient Instructions (Signed)
Person Under Monitoring Name: Shelley Zuniga  Location: [address] [place] [address]   Infection Prevention Recommendations for Individuals Confirmed to have, or Being Evaluated for, 2019 Novel Coronavirus (COVID-19) Infection Who Receive Care at Home     HERE'S HOW YOU MANAGE COVID INFECTION :  VITAMIN C 1000 MG DAILY VITAMIN D 4000 IU DAILY  ZINC 50 MG DAILY   (stop these megadoses once you are feeling better)  I have prescribed  Prednisone, albuterol MDI,  Benzonatate cough capsules,  And azithromycin antibiotic.   You can take 2400 mg of advil daily in divided doses (600 mg every 6 hours) and/or tylenol 1000 mg every 12 hours for the body aches and fevers    Use sudafed,  Sudafed PE or Afrin nasal spray for sinus congestion.  Take a probiotic (or eat yogurt) daily for 3 weeks   DO NOT COMBINE PREDNISONE WITH ADVIL (STOP THE ADVIL IF YOU START THE PREDNISONE)  OK TO TAKE TYLENOL WITH PREDNISONE:   YOU SHOULD BE FEVER FREE FOR 24 HOURS WITHOUT THE USE OF ADVIL OR MOTRIN ,  AND ALL SYMPTOMS NEED TO BE IMPROVING BEFORE YOU CAN BREAK QUARANTINE (MINIMUM OF 7 DAYS  FROM DAY 1 OF SYMPTOMS )    Individuals who are confirmed to have, or are being evaluated for, COVID-19 should follow the prevention steps below until a healthcare provider or local or state health department says they can return to normal activities.  Stay home except to get medical care You should restrict activities outside your home, except for getting medical care. Do not go to work, school, or public areas, and do not use public transportation or taxis.  Monitor your symptoms Seek prompt medical attention if your illness is worsening (e.g., difficulty breathing).  Wear a facemask You should wear a facemask that covers your nose and mouth when you are in the same room with other people  People who live with or visit you should also wear a facemask while they are in the same room with  you.  Separate yourself from other people in your home As much as possible, you should stay in a different room from other people in your home. Also, you should use a separate bathroom, if available.  Avoid sharing household items You should not share dishes, drinking glasses, cups, eating utensils, towels, bedding, or other items with other people in your home. After using these items, you should wash them thoroughly with soap and water.  Cover your coughs and sneezes Cover your mouth and nose with a tissue when you cough or sneeze, or you can cough or sneeze into your sleeve. Throw used tissues in a lined trash can, and immediately wash your hands with soap and water for at least 20 seconds or use an alcohol-based hand rub.  Wash your [employer] your hands often and thoroughly with soap and water for at least 20 seconds. You can use an alcohol-based hand sanitizer if soap and water are not available and if your hands are not visibly dirty. Avoid touching your eyes, nose, and mouth with unwashed hands.   Prevention Steps for Caregivers and Household Members of Individuals Confirmed to have, or Being Evaluated for, COVID-19 Infection Being Cared for in the Home  If you live with, or provide care at home for, a person confirmed to have, or being evaluated for, COVID-19 infection please follow these guidelines to prevent infection:  Follow healthcare provider's instructions Make sure  that you understand and can help the patient follow any healthcare provider instructions for all care.  Provide for the patient's basic needs You should help the patient with basic needs in the home and provide support for getting groceries, prescriptions, and other personal needs.  Monitor the patient's symptoms If they are getting sicker, call his or her medical provider and tell them that the patient has, or is being evaluated for, COVID-19 infection. This will help the healthcare provider's office  take steps to keep other people from getting infected. Ask the healthcare provider to call the local or state health department.  Limit the number of people who have contact with the patient  If possible, have only one caregiver for the patient.  Other household members should stay in another home or place of residence. If this is not possible, they should stay  in another room, or be separated from the patient as much as possible. Use a separate bathroom, if available.  Restrict visitors who do not have an essential need to be in the home.  Keep older adults, very young children, and other sick people away from the patient Keep older adults, very young children, and those who have compromised immune systems or chronic health conditions away from the patient. This includes people with chronic heart, lung, or kidney conditions, diabetes, and cancer.  Ensure good ventilation Make sure that shared spaces in the home have good air flow, such as from an air conditioner or an opened window, weather permitting.  Wash your hands often  Wash your hands often and thoroughly with soap and water for at least 20 seconds. You can use an alcohol based hand sanitizer if soap and water are not available and if your hands are not visibly dirty.  Avoid touching your eyes, nose, and mouth with unwashed hands.  Use disposable paper towels to dry your hands. If not available, use dedicated cloth towels and replace them when they become wet.  Wear a facemask and gloves  Wear a disposable facemask at all times in the room and gloves when you touch or have contact with the patient's blood, body fluids, and/or secretions or excretions, such as sweat, saliva, sputum, nasal mucus, vomit, urine, or feces.  Ensure the mask fits over your nose and mouth tightly, and do not touch it during use.  Throw out disposable facemasks and gloves after using them. Do not reuse.  Wash your hands immediately after removing  your facemask and gloves.  If your personal clothing becomes contaminated, carefully remove clothing and launder. Wash your hands after handling contaminated clothing.  Place all used disposable facemasks, gloves, and other waste in a lined container before disposing them with other household waste.  Remove gloves and wash your hands immediately after handling these items.  Do not share dishes, glasses, or other household items with the patient  Avoid sharing household items. You should not share dishes, drinking glasses, cups, eating utensils, towels, bedding, or other items with a patient who is confirmed to have, or being evaluated for, COVID-19 infection.  After the person uses these items, you should wash them thoroughly with soap and water.  Wash laundry thoroughly  Immediately remove and wash clothes or bedding that have blood, body fluids, and/or secretions or excretions, such as sweat, saliva, sputum, nasal mucus, vomit, urine, or feces, on them.  Wear gloves when handling laundry from the patient.  Read and follow directions on labels of laundry or clothing items and detergent. In general,  wash and dry with the warmest temperatures recommended on the label.  Clean all areas the individual has used often  Clean all touchable surfaces, such as counters, tabletops, doorknobs, bathroom fixtures, toilets, phones, keyboards, tablets, and bedside tables, every day. Also, clean any surfaces that may have blood, body fluids, and/or secretions or excretions on them.  Wear gloves when cleaning surfaces the patient has come in contact with.  Use a diluted bleach solution (e.g., dilute bleach with 1 part bleach and 10 parts water) or a household disinfectant with a label that says EPA-registered for coronaviruses. To make a bleach solution at home, add 1 tablespoon of bleach to 1 quart (4 cups) of water. For a larger supply, add  cup of bleach to 1 gallon (16 cups) of water.  Read labels  of cleaning products and follow recommendations provided on product labels. Labels contain instructions for safe and effective use of the cleaning product including precautions you should take when applying the product, such as wearing gloves or eye protection and making sure you have good ventilation during use of the product.  Remove gloves and wash hands immediately after cleaning.  Monitor yourself for signs and symptoms of illness Caregivers and household members are considered close contacts, should monitor their health, and will be asked to limit movement outside of the home to the extent possible. Follow the monitoring steps for close contacts listed on the symptom monitoring form.   ? If you have additional questions, contact your local health department or call the epidemiologist on call at [phone] (available 24/7). ? This guidance is subject to change. For the most up-to-date guidance from [facility], please refer to their website: [employer]

## 2020-06-11 NOTE — Progress Notes (Signed)
Virtual Visit via Caregility  This visit type was conducted due to national recommendations for restrictions regarding the COVID-19 pandemic (e.g. social distancing).  This format is felt to be most appropriate for this patient at this time.  All issues noted in this document were discussed and addressed.  No physical exam was performed (except for noted visual exam findings with Video Visits).   I connected with@ on 06/11/20 at  1:00 PM EST by a video enabled telemedicine application  and verified that I am speaking with the correct person using two identifiers. Location patient: home Location provider: work or home office Persons participating in the virtual visit: patient, provider  I discussed the limitations, risks, security and privacy concerns of performing an evaluation and management service by telephone and the availability of in person appointments. I also discussed with the patient that there may be a patient responsible charge related to this service. The patient expressed understanding and agreed to proceed.   Reason for visit: COVID INFECTION   HPI:  70 YR OLD FEMALE PREVIOUSLY VACCINATED AGAINST COVID . Booster in Sept 2021.  Presents with < 24 hr history of fever, body aches,  Cough and chest tightness.  Positive home COVID TEST this morning.    Husband is asymptomatic.    ROS: See pertinent positives and negatives per HPI.  Past Medical History:  Diagnosis Date  . B12 neuropathy (HCC) 10/20/2013  . Colon polyp 2001   colonoscopy, [facility]   . Headache    regularly, not migraines  . Heart murmur   . Hypertension   . Motion sickness   . PONV (postoperative nausea and vomiting)   . Strabismus age 30   eye surgery  . Wears hearing aid in both ears     Past Surgical History:  Procedure Laterality Date  . ABDOMINAL HYSTERECTOMY  2000   [facility], [facility], fibroids/menorrhagia  . BASAL CELL CARCINOMA EXCISION Left 2013   Dr. Orson Aloe  . BILATERAL OOPHORECTOMY  2000    fibroid cysts  . CATARACT EXTRACTION W/PHACO Left 07/11/2019   Procedure: CATARACT EXTRACTION PHACO AND INTRAOCULAR LENS PLACEMENT (IOC) LEFT 8.41 00:49.8 ;  Surgeon: Galen Manila, MD;  Location: [facility];  Service: Ophthalmology;  Laterality: Left;  . CATARACT EXTRACTION W/PHACO Right 07/25/2019   Procedure: CATARACT EXTRACTION PHACO AND INTRAOCULAR LENS PLACEMENT (IOC) RIGHT TORIC LENS;  Surgeon: Galen Manila, MD;  Location: [facility];  Service: Ophthalmology;  Laterality: Right;  CDE 4.84 U/S 0:35.7  . EYE SURGERY  1956   to correct strabismus  . GALLBLADDER SURGERY    . HEMORRHOID SURGERY      Family History  Problem Relation Age of Onset  . Cancer Mother        melanoma, squamous cell on nose  . Cancer Father        lung, tobacco user  . Cancer Brother        throat, tobacco user  . Cancer Maternal Grandmother        uterine/ovarian    SOCIAL HX:  reports that she has never smoked. She has never used smokeless tobacco. She reports that she does not drink alcohol and does not use drugs.   Current Outpatient Medications:  .  albuterol (VENTOLIN HFA) 108 (90 Base) MCG/ACT inhaler, Inhale 2 puffs into the lungs every 6 (six) hours as needed for wheezing or shortness of breath., Disp: 3.7 g, Rfl: 11 .  ALPRAZolam (XANAX) 0.25 MG tablet, Take 1 tablet (0.25 mg total) by  mouth at bedtime as needed for anxiety., Disp: 30 tablet, Rfl: 5 .  amLODipine (NORVASC) 5 MG tablet, TAKE 1 TABLET(5 MG) BY MOUTH DAILY, Disp: 90 tablet, Rfl: 3 .  aspirin 81 MG tablet, Take 81 mg by mouth daily., Disp: , Rfl:  .  atorvastatin (LIPITOR) 20 MG tablet, TAKE 1 TABLET(20 MG) BY MOUTH DAILY, Disp: 90 tablet, Rfl: 3 .  azithromycin (ZITHROMAX) 500 MG tablet, Take 1 tablet (500 mg total) by mouth daily., Disp: 7 tablet, Rfl: 0 .  benzonatate (TESSALON) 200 MG capsule, Take 1 capsule (200 mg total) by mouth 3 (three) times daily as needed for cough., Disp: 60 capsule, Rfl: 1 .   buPROPion (WELLBUTRIN SR) 100 MG 12 hr tablet, TAKE 1 TABLET BY MOUTH TWICE DAILY, Disp: 180 tablet, Rfl: 3 .  Cranberry 500 MG CAPS, , Disp: , Rfl:  .  Docusate Calcium (STOOL SOFTENER PO), Take by mouth., Disp: , Rfl:  .  estradiol (ESTRACE) 1 MG tablet, Take 1 mg by mouth daily., Disp: , Rfl:  .  fluocinonide cream (LIDEX) 0.05 %, Apply 1 application topically as needed., Disp: , Rfl:  .  fluticasone (FLONASE) 50 MCG/ACT nasal spray, SPRAY 2 SPRAYS IN EACH NOSTRIL EVERY DAY, Disp: 48 g, Rfl: 0 .  hydrocortisone cream 0.5 %, Apply 1 application topically 2 (two) times daily as needed for itching., Disp: , Rfl:  .  losartan-hydrochlorothiazide (HYZAAR) 100-25 MG tablet, Take 1 tablet by mouth daily., Disp: 90 tablet, Rfl: 3 .  predniSONE (DELTASONE) 10 MG tablet, 6 tablets on Day 1 , then reduce by 1 tablet daily until gone, Disp: 21 tablet, Rfl: 0 .  PREVIDENT 5000 DRY MOUTH 1.1 % GEL dental gel, , Disp: , Rfl:  .  VITAMIN D PO, Take 400 mg by mouth daily., Disp: , Rfl:  .  vitamin E 400 UNIT capsule, Take 400 Units by mouth daily., Disp: , Rfl:   Current Facility-Administered Medications:  .  methylPREDNISolone acetate (DEPO-MEDROL) injection 40 mg, 40 mg, Intramuscular, Once, Darrick Huntsman, Mar Daring, MD  EXAM:  VITALS per patient if applicable:  GENERAL: alert, oriented, appears well and in no acute distress  HEENT: atraumatic, conjunttiva clear, no obvious abnormalities on inspection of external nose and ears  NECK: normal movements of the head and neck  LUNGS: on inspection no signs of respiratory distress, breathing rate appears normal, no obvious gross SOB, gasping or wheezing  CV: no obvious cyanosis  MS: moves all visible extremities without noticeable abnormality  PSYCH/NEURO: pleasant and cooperative, no obvious depression or anxiety, speech and thought processing grossly intact  ASSESSMENT AND PLAN:  Discussed the following assessment and plan:  COVID-19 virus infection -  Plan: predniSONE (DELTASONE) 10 MG tablet, albuterol (VENTOLIN HFA) 108 (90 Base) MCG/ACT inhaler, benzonatate (TESSALON) 200 MG capsule, azithromycin (ZITHROMAX) 500 MG tablet  COVID-19 virus infection SYMPTOMS suggest she is becoming bronchospastic.  Sending prednisone taper, abuterol MDI, azithromycin and tessalon perles to [facility].  Advised her to pick up a pulse oximetry as well .  .ttcovid    I discussed the assessment and treatment plan with the patient. The patient was provided an opportunity to ask questions and all were answered. The patient agreed with the plan and demonstrated an understanding of the instructions.   The patient was advised to call back or seek an in-person evaluation if the symptoms worsen or if the condition fails to improve as anticipated.  A total of 20 minutes of face to face time  was spent with patient more than half of which was spent in counselling about the above mentioned conditions  and coordination of care   Sherlene Shams, MD

## 2020-06-11 NOTE — Assessment & Plan Note (Addendum)
SYMPTOMS suggest she is becoming bronchospastic.  Sending prednisone taper, abuterol MDI, azithromycin and tessalon perles to [facility].  Advised her to pick up a pulse oximetry as well .  .ttcovid

## 2020-06-13 ENCOUNTER — Ambulatory Visit: Payer: Medicare PPO

## 2020-06-13 VITALS — Ht 64.0 in | Wt 166.0 lb

## 2020-06-13 DIAGNOSIS — Z0000 Encounter for general adult medical examination without abnormal findings: Secondary | ICD-10-CM | POA: Diagnosis not present

## 2020-06-13 NOTE — Progress Notes (Addendum)
Subjective:   Shelley Zuniga is a 70 y.o. female who presents for Medicare Annual (Subsequent) preventive examination.  Review of Systems    No ROS.  Medicare Wellness Virtual Visit.    Cardiac Risk Factors include: advanced age (>50men, >3 women);hypertension     Objective:    Today's Vitals   06/13/20 1135  Weight: 166 lb (75.3 kg)  Height: 5\' 4"  (1.626 m)   Body mass index is 28.49 kg/m.  Advanced Directives 06/13/2020 07/25/2019 06/13/2019  Does Patient Have a Medical Advance Directive? No No No  Would patient like information on creating a medical advance directive? No - Patient declined No - Patient declined No - Patient declined    Current Medications (verified) Outpatient Encounter Medications as of 06/13/2020  Medication Sig   albuterol (VENTOLIN HFA) 108 (90 Base) MCG/ACT inhaler Inhale 2 puffs into the lungs every 6 (six) hours as needed for wheezing or shortness of breath.   ALPRAZolam (XANAX) 0.25 MG tablet Take 1 tablet (0.25 mg total) by mouth at bedtime as needed for anxiety.   amLODipine (NORVASC) 5 MG tablet TAKE 1 TABLET(5 MG) BY MOUTH DAILY   aspirin 81 MG tablet Take 81 mg by mouth daily.   atorvastatin (LIPITOR) 20 MG tablet TAKE 1 TABLET(20 MG) BY MOUTH DAILY   azithromycin (ZITHROMAX) 500 MG tablet Take 1 tablet (500 mg total) by mouth daily.   benzonatate (TESSALON) 200 MG capsule Take 1 capsule (200 mg total) by mouth 3 (three) times daily as needed for cough.   buPROPion (WELLBUTRIN SR) 100 MG 12 hr tablet TAKE 1 TABLET BY MOUTH TWICE DAILY   Cranberry 500 MG CAPS    Docusate Calcium (STOOL SOFTENER PO) Take by mouth.   estradiol (ESTRACE) 1 MG tablet Take 1 mg by mouth daily.   fluocinonide cream (LIDEX) 0.05 % Apply 1 application topically as needed.   fluticasone (FLONASE) 50 MCG/ACT nasal spray SPRAY 2 SPRAYS IN EACH NOSTRIL EVERY DAY   hydrocortisone cream 0.5 % Apply 1 application topically 2 (two) times daily as needed for itching.    losartan-hydrochlorothiazide (HYZAAR) 100-25 MG tablet Take 1 tablet by mouth daily.   predniSONE (DELTASONE) 10 MG tablet 6 tablets on Day 1 , then reduce by 1 tablet daily until gone   PREVIDENT 5000 DRY MOUTH 1.1 % GEL dental gel    VITAMIN D PO Take 400 mg by mouth daily.   vitamin E 400 UNIT capsule Take 400 Units by mouth daily.   Facility-Administered Encounter Medications as of 06/13/2020  Medication   methylPREDNISolone acetate (DEPO-MEDROL) injection 40 mg    Allergies (verified) Morphine and related and Sulfa antibiotics   History: Past Medical History:  Diagnosis Date   B12 neuropathy (HCC) 10/20/2013   Colon polyp 2001   colonoscopy, [facility]    Headache    regularly, not migraines   Heart murmur    Hypertension    Motion sickness    PONV (postoperative nausea and vomiting)    Strabismus age 2   eye surgery   Wears hearing aid in both ears    Past Surgical History:  Procedure Laterality Date   ABDOMINAL HYSTERECTOMY  2000   [place], [facility], fibroids/menorrhagia   BASAL CELL CARCINOMA EXCISION Left 2013   Dr. 2014   BILATERAL OOPHORECTOMY  2000   fibroid cysts   CATARACT EXTRACTION W/PHACO Left 07/11/2019   Procedure: CATARACT EXTRACTION PHACO AND INTRAOCULAR LENS PLACEMENT (IOC) LEFT 8.41 00:49.8 ;  Surgeon: 07/13/2019, MD;  Location: [facility];  Service: Ophthalmology;  Laterality: Left;   CATARACT EXTRACTION W/PHACO Right 07/25/2019   Procedure: CATARACT EXTRACTION PHACO AND INTRAOCULAR LENS PLACEMENT (IOC) RIGHT TORIC LENS;  Surgeon: Galen Manila, MD;  Location: [facility];  Service: Ophthalmology;  Laterality: Right;  CDE 4.84 U/S 0:35.7   EYE SURGERY  1956   to correct strabismus   GALLBLADDER SURGERY     HEMORRHOID SURGERY     Family History  Problem Relation Age of Onset   Cancer Mother        melanoma, squamous cell on nose   Hypertension Mother    COPD Mother    Hyperlipidemia Mother    Cancer Father        lung,  tobacco user   Cancer Brother        throat, tobacco user   Cancer Maternal Grandmother        uterine/ovarian   Social History   Socioeconomic History   Marital status: Married    Spouse name: Not on file   Number of children: Not on file   Years of education: Not on file   Highest education level: Not on file  Occupational History   Not on file  Tobacco Use   Smoking status: Never Smoker   Smokeless tobacco: Never Used  Vaping Use   Vaping Use: Never used  Substance and Sexual Activity   Alcohol use: No   Drug use: No   Sexual activity: Yes    Birth control/protection: Post-menopausal  Other Topics Concern   Not on file  Social History Narrative   Not on file   Social Determinants of Health   Financial Resource Strain: Low Risk    Difficulty of Paying Living Expenses: Not hard at all  Food Insecurity: No Food Insecurity   Worried About Programme researcher, broadcasting/film/video in the Last Year: Never true   Ran Out of Food in the Last Year: Never true  Transportation Needs: No Transportation Needs   Lack of Transportation (Medical): No   Lack of Transportation (Non-Medical): No  Physical Activity: Not on file  Stress: No Stress Concern Present   Feeling of Stress : Not at all  Social Connections: Unknown   Frequency of Communication with Friends and Family: Not on file   Frequency of Social Gatherings with Friends and Family: Not on file   Attends Religious Services: Not on file   Active Member of Clubs or Organizations: Not on file   Attends Banker Meetings: Not on file   Marital Status: Married    Tobacco Counseling Counseling given: Not Answered   Clinical Intake:  Pre-visit preparation completed: Yes        Diabetes: No  How often do you need to have someone help you when you read instructions, pamphlets, or other written materials from your doctor or pharmacy?: 1 - Never  Interpreter Needed?: No      Activities of Daily Living In your present  state of health, do you have any difficulty performing the following activities: 06/13/2020 07/25/2019  Hearing? Y N  Vision? N N  Difficulty concentrating or making decisions? N N  Walking or climbing stairs? N N  Dressing or bathing? N N  Doing errands, shopping? N -  Preparing Food and eating ? N -  Using the Toilet? N -  In the past six months, have you accidently leaked urine? Y -  Comment Managed with daily pad -  Do you have problems  with loss of bowel control? N -  Managing your Medications? N -  Managing your Finances? N -  Housekeeping or managing your Housekeeping? N -  Some recent data might be hidden    Patient Care Team: Sherlene Shams, MD as PCP - General (Internal Medicine)  Indicate any recent Medical Services you may have received from other than [facility] providers in the past year (date may be approximate).     Assessment:   This is a routine wellness examination for Shelley Zuniga.  I connected with Tandra today by telephone and verified that I am speaking with the correct person using two identifiers. Location patient: home Location provider: work Persons participating in the virtual visit: patient, Engineer, civil (consulting).    I discussed the limitations, risks, security and privacy concerns of performing an evaluation and management service by telephone and the availability of in person appointments. The patient expressed understanding and verbally consented to this telephonic visit.    Interactive audio and video telecommunications were attempted between this provider and patient, however failed, due to patient having technical difficulties OR patient did not have access to video capability.  We continued and completed visit with audio only.  Some vital signs may be absent or patient reported.   Hearing/Vision screen  Hearing Screening   125Hz  250Hz  500Hz  1000Hz  2000Hz  3000Hz  4000Hz  6000Hz  8000Hz   Right ear:           Left ear:           Comments: Hearing aid, bilateral   Vision  Screening Comments: Wears corrective lenses Cataract extraction, bilateral Visual acuity not assessed, virtual visit.     Dietary issues and exercise activities discussed: Current Exercise Habits: Home exercise routine  Healthy diet Good water intake  Goals       Patient Stated     Healthy Lifestyle (pt-stated)      Eat a healthy diet Stay active       Depression Screen PHQ 2/9 Scores 06/13/2020 10/12/2019 06/13/2019 05/30/2018 05/24/2017 05/15/2016  PHQ - 2 Score 0 0 0 2 1 0  PHQ- 9 Score - - - 9 8 -    Fall Risk Fall Risk  06/13/2020 06/11/2020 12/18/2019 10/12/2019 06/23/2019  Falls in the past year? 0 0 0 0 0  Number falls in past yr: 0 - - 0 -  Injury with Fall? - - - 0 -  Follow up Falls evaluation completed Falls evaluation completed Falls evaluation completed Falls evaluation completed Falls evaluation completed    FALL RISK PREVENTION PERTAINING TO THE HOME: Handrails in use when climbing stairs? Yes Home free of loose throw rugs in walkways, pet beds, electrical cords, etc? Yes  Adequate lighting in your home to reduce risk of falls? Yes   ASSISTIVE DEVICES UTILIZED TO PREVENT FALLS: Use of a cane, walker or w/c? No   TIMED UP AND GO: Was the test performed? No . Virtual visit.   Cognitive Function:  Patient is alert and oriented x3.  Denies difficulty focusing, making decisions, memory loss.  Enjoys sodoku, Copywriter, advertising, sewing and other brain health activities.  MMSE/6CIT deferred. Normal by direct communication/observation.    6CIT Screen 06/13/2019  What Year? 0 points  What month? 0 points  What time? 0 points  Count back from 20 0 points  Months in reverse 0 points    Immunizations Immunization History  Administered Date(s) Administered   Fluad Quad(high Dose 65+) 03/19/2020   Influenza Split 03/26/2012   Influenza, High Dose Seasonal PF  02/22/2019   Influenza,inj,Quad PF,6+ Mos 02/22/2013, 02/10/2014   Influenza-Unspecified 04/04/2016, 03/13/2017,  01/31/2018   PFIZER(Purple Top)SARS-COV-2 Vaccination 05/25/2019, 06/15/2019, 02/12/2020   Pneumococcal Conjugate-13 05/15/2016   Pneumococcal Polysaccharide-23 03/14/2020   Td 12/08/2018   Tdap 01/20/2010   Zoster 10/19/2013   Health Maintenance Health Maintenance  Topic Date Due   Fecal DNA (Cologuard)  11/17/2019   COVID-19 Vaccine (4 - Booster for Pfizer series) 08/11/2020   MAMMOGRAM  11/16/2020   TETANUS/TDAP  12/07/2028   INFLUENZA VACCINE  Completed   DEXA SCAN  Completed   Hepatitis C Screening  Completed   PNA vac Low Risk Adult  Completed    Cologuard- patient reports receiving notification that screening complete. No record on file to update.   Mammogram status: Completed 11/17/19. Repeat every year  Bone Density status: Completed 06/15/16. Results reflect: Bone density results: NORMAL. Repeat every 5 years.  Lung Cancer Screening: (Low Dose CT Chest recommended if Age 65-80 years, 30 pack-year currently smoking OR have quit w/in 15years.) does not qualify.    Hepatitis C Screening: Completed 03/14/20.  Vision Screening: Recommended annual ophthalmology exams for early detection of glaucoma and other disorders of the eye. Is the patient up to date with their annual eye exam?  Yes  Who is the provider or what is the name of the office in which the patient attends annual eye exams?   Dental Screening: Recommended annual dental exams for proper oral hygiene. Visits every 6 months.   Community Resource Referral / Chronic Care Management: CRR required this visit?  No   CCM required this visit?  No      Plan:   Keep all routine maintenance appointments.   I have personally reviewed and noted the following in the patient's chart:   Medical and social history Use of alcohol, tobacco or illicit drugs  Current medications and supplements Functional ability and status Nutritional status Physical activity Advanced directives List of other  physicians Hospitalizations, surgeries, and ER visits in previous 12 months Vitals Screenings to include cognitive, depression, and falls Referrals and appointments  In addition, I have reviewed and discussed with patient certain preventive protocols, quality metrics, and best practice recommendations. A written personalized care plan for preventive services as well as general preventive health recommendations were provided to patient via mychart.     OBrien-Blaney, Taniaya Rudder L, LPN   [id number]     I have reviewed the above information and agree with above.   Duncan Dull, MD

## 2020-06-13 NOTE — Patient Instructions (Addendum)
Shelley Zuniga , Thank you for taking time to come for your Medicare Wellness Visit. I appreciate your ongoing commitment to your health goals. Please review the following plan we discussed and let me know if I can assist you in the future.   These are the goals we discussed: Goals      Patient Stated   .  Healthy Lifestyle (pt-stated)      Eat a healthy diet Stay active       This is a list of the screening recommended for you and due dates:  Health Maintenance  Topic Date Due  . Cologuard (Stool DNA test)  11/17/2019  . COVID-19 Vaccine (4 - Booster for Pfizer series) 08/11/2020  . Mammogram  11/16/2020  . Tetanus Vaccine  12/07/2028  . Flu Shot  Completed  . DEXA scan (bone density measurement)  Completed  .  Hepatitis C: One time screening is recommended by Center for Disease Control  (CDC) for  adults born from [birth year] through 1966.   Completed  . Pneumonia vaccines  Completed    Immunizations Immunization History  Administered Date(s) Administered  . Fluad Quad(high Dose 65+) 03/19/2020  . Influenza Split 03/26/2012  . Influenza, High Dose Seasonal PF 02/22/2019  . Influenza,inj,Quad PF,6+ Mos 02/22/2013, 02/10/2014  . Influenza-Unspecified 04/04/2016, 03/13/2017, 01/31/2018  . PFIZER(Purple Top)SARS-COV-2 Vaccination 05/25/2019, 06/15/2019, 02/12/2020  . Pneumococcal Conjugate-13 05/15/2016  . Pneumococcal Polysaccharide-23 03/14/2020  . Td 12/08/2018  . Tdap 01/20/2010  . Zoster 10/19/2013   Advanced directives: not yet completed  Conditions/risks identified: none new  Follow up in one year for your annual wellness visit.   Preventive Care 5 Years and Older, Female Preventive care refers to lifestyle choices and visits with your health care provider that can promote health and wellness. What does preventive care include?  A yearly physical exam. This is also called an annual well check.  Dental exams once or twice a year.  Routine eye exams. Ask your health  care provider how often you should have your eyes checked.  Personal lifestyle choices, including:  Daily care of your teeth and gums.  Regular physical activity.  Eating a healthy diet.  Avoiding tobacco and drug use.  Limiting alcohol use.  Practicing safe sex.  Taking low-dose aspirin every day.  Taking vitamin and mineral supplements as recommended by your health care provider. What happens during an annual well check? The services and screenings done by your health care provider during your annual well check will depend on your age, overall health, lifestyle risk factors, and family history of disease. Counseling  Your health care provider may ask you questions about your:  Alcohol use.  Tobacco use.  Drug use.  Emotional well-being.  Home and relationship well-being.  Sexual activity.  Eating habits.  History of falls.  Memory and ability to understand (cognition).  Work and work Astronomer.  Reproductive health. Screening  You may have the following tests or measurements:  Height, weight, and BMI.  Blood pressure.  Lipid and cholesterol levels. These may be checked every 5 years, or more frequently if you are over 70 years old.  Skin check.  Lung cancer screening. You may have this screening every year starting at age 29 if you have a 30-pack-year history of smoking and currently smoke or have quit within the past 15 years.  Fecal occult blood test (FOBT) of the stool. You may have this test every year starting at age 86.  Flexible sigmoidoscopy or colonoscopy. You may  have a sigmoidoscopy every 5 years or a colonoscopy every 10 years starting at age 42.  Hepatitis C blood test.  Hepatitis B blood test.  Sexually transmitted disease (STD) testing.  Diabetes screening. This is done by checking your blood sugar (glucose) after you have not eaten for a while (fasting). You may have this done every 1-3 years.  Bone density scan. This is done  to screen for osteoporosis. You may have this done starting at age 32.  Mammogram. This may be done every 1-2 years. Talk to your health care provider about how often you should have regular mammograms. Talk with your health care provider about your test results, treatment options, and if necessary, the need for more tests. Vaccines  Your health care provider may recommend certain vaccines, such as:  Influenza vaccine. This is recommended every year.  Tetanus, diphtheria, and acellular pertussis (Tdap, Td) vaccine. You may need a Td booster every 10 years.  Zoster vaccine. You may need this after age 48.  Pneumococcal 13-valent conjugate (PCV13) vaccine. One dose is recommended after age 34.  Pneumococcal polysaccharide (PPSV23) vaccine. One dose is recommended after age 57. Talk to your health care provider about which screenings and vaccines you need and how often you need them. This information is not intended to replace advice given to you by your health care provider. Make sure you discuss any questions you have with your health care provider. Document Released: 05/31/2015 Document Revised: 01/22/2016 Document Reviewed: 03/05/2015 Elsevier Interactive Patient Education  2017 [place] Prevention in the Home Falls can cause injuries. They can happen to people of all ages. There are many things you can do to make your home safe and to help prevent falls. What can I do on the outside of my home?  Regularly fix the edges of walkways and driveways and fix any cracks.  Remove anything that might make you trip as you walk through a door, such as a raised step or threshold.  Trim any bushes or trees on the path to your home.  Use bright outdoor lighting.  Clear any walking paths of anything that might make someone trip, such as rocks or tools.  Regularly check to see if handrails are loose or broken. Make sure that both sides of any steps have handrails.  Any raised decks  and porches should have guardrails on the edges.  Have any leaves, snow, or ice cleared regularly.  Use sand or salt on walking paths during winter.  Clean up any spills in your garage right away. This includes oil or grease spills. What can I do in the bathroom?  Use night lights.  Install grab bars by the toilet and in the tub and shower. Do not use towel bars as grab bars.  Use non-skid mats or decals in the tub or shower.  If you need to sit down in the shower, use a plastic, non-slip stool.  Keep the floor dry. Clean up any water that spills on the floor as soon as it happens.  Remove soap buildup in the tub or shower regularly.  Attach bath mats securely with double-sided non-slip rug tape.  Do not have throw rugs and other things on the floor that can make you trip. What can I do in the bedroom?  Use night lights.  Make sure that you have a light by your bed that is easy to reach.  Do not use any sheets or blankets that are too big for your  bed. They should not hang down onto the floor.  Have a firm chair that has side arms. You can use this for support while you get dressed.  Do not have throw rugs and other things on the floor that can make you trip. What can I do in the kitchen?  Clean up any spills right away.  Avoid walking on wet floors.  Keep items that you use a lot in easy-to-reach places.  If you need to reach something above you, use a strong step stool that has a grab bar.  Keep electrical cords out of the way.  Do not use floor polish or wax that makes floors slippery. If you must use wax, use non-skid floor wax.  Do not have throw rugs and other things on the floor that can make you trip. What can I do with my stairs?  Do not leave any items on the stairs.  Make sure that there are handrails on both sides of the stairs and use them. Fix handrails that are broken or loose. Make sure that handrails are as long as the stairways.  Check any  carpeting to make sure that it is firmly attached to the stairs. Fix any carpet that is loose or worn.  Avoid having throw rugs at the top or bottom of the stairs. If you do have throw rugs, attach them to the floor with carpet tape.  Make sure that you have a light switch at the top of the stairs and the bottom of the stairs. If you do not have them, ask someone to add them for you. What else can I do to help prevent falls?  Wear shoes that:  Do not have high heels.  Have rubber bottoms.  Are comfortable and fit you well.  Are closed at the toe. Do not wear sandals.  If you use a stepladder:  Make sure that it is fully opened. Do not climb a closed stepladder.  Make sure that both sides of the stepladder are locked into place.  Ask someone to hold it for you, if possible.  Clearly mark and make sure that you can see:  Any grab bars or handrails.  First and last steps.  Where the edge of each step is.  Use tools that help you move around (mobility aids) if they are needed. These include:  Canes.  Walkers.  Scooters.  Crutches.  Turn on the lights when you go into a dark area. Replace any light bulbs as soon as they burn out.  Set up your furniture so you have a clear path. Avoid moving your furniture around.  If any of your floors are uneven, fix them.  If there are any pets around you, be aware of where they are.  Review your medicines with your doctor. Some medicines can make you feel dizzy. This can increase your chance of falling. Ask your doctor what other things that you can do to help prevent falls. This information is not intended to replace advice given to you by your health care provider. Make sure you discuss any questions you have with your health care provider. Document Released: 02/28/2009 Document Revised: 10/10/2015 Document Reviewed: 06/08/2014 Elsevier Interactive Patient Education  2017 [employer].

## 2020-06-14 ENCOUNTER — Ambulatory Visit: Payer: Medicare PPO

## 2020-07-02 ENCOUNTER — Other Ambulatory Visit: Payer: Self-pay

## 2020-07-02 ENCOUNTER — Ambulatory Visit (INDEPENDENT_AMBULATORY_CARE_PROVIDER_SITE_OTHER): Payer: Medicare PPO

## 2020-07-02 DIAGNOSIS — E538 Deficiency of other specified B group vitamins: Secondary | ICD-10-CM | POA: Diagnosis not present

## 2020-07-02 MED ORDER — CYANOCOBALAMIN 1000 MCG/ML IJ SOLN
1000.0000 ug | Freq: Once | INTRAMUSCULAR | Status: AC
  Administered 2020-07-02: 1000 ug via INTRAMUSCULAR

## 2020-07-02 NOTE — Progress Notes (Signed)
Patient presented for B 12 injection to left deltoid, patient voiced no concerns nor showed any signs of distress during injection. 

## 2020-08-02 ENCOUNTER — Other Ambulatory Visit: Payer: Self-pay

## 2020-08-02 ENCOUNTER — Ambulatory Visit (INDEPENDENT_AMBULATORY_CARE_PROVIDER_SITE_OTHER): Payer: Medicare PPO | Admitting: *Deleted

## 2020-08-02 DIAGNOSIS — E538 Deficiency of other specified B group vitamins: Secondary | ICD-10-CM

## 2020-08-02 MED ORDER — CYANOCOBALAMIN 1000 MCG/ML IJ SOLN
1000.0000 ug | Freq: Once | INTRAMUSCULAR | Status: AC
  Administered 2020-08-02: 1000 ug via INTRAMUSCULAR

## 2020-08-02 NOTE — Progress Notes (Signed)
Patient presented for B 12 injection to right deltoid, patient voiced no concerns nor showed any signs of distress during injection. 

## 2020-08-08 DIAGNOSIS — M19042 Primary osteoarthritis, left hand: Secondary | ICD-10-CM | POA: Diagnosis not present

## 2020-08-08 DIAGNOSIS — M19041 Primary osteoarthritis, right hand: Secondary | ICD-10-CM | POA: Diagnosis not present

## 2020-09-10 ENCOUNTER — Ambulatory Visit (INDEPENDENT_AMBULATORY_CARE_PROVIDER_SITE_OTHER): Payer: Medicare PPO

## 2020-09-10 ENCOUNTER — Other Ambulatory Visit: Payer: Self-pay

## 2020-09-10 DIAGNOSIS — E538 Deficiency of other specified B group vitamins: Secondary | ICD-10-CM

## 2020-09-10 MED ORDER — CYANOCOBALAMIN 1000 MCG/ML IJ SOLN
1000.0000 ug | Freq: Once | INTRAMUSCULAR | Status: AC
  Administered 2020-09-10: 1000 ug via INTRAMUSCULAR

## 2020-09-10 NOTE — Progress Notes (Signed)
Patient presented for B 12 injection to left deltoid, patient voiced no concerns nor showed any signs of distress during injection. 

## 2020-09-16 ENCOUNTER — Ambulatory Visit: Payer: Medicare PPO | Admitting: Adult Health

## 2020-09-16 ENCOUNTER — Other Ambulatory Visit: Payer: Self-pay

## 2020-09-16 ENCOUNTER — Encounter: Payer: Self-pay | Admitting: Adult Health

## 2020-09-16 VITALS — BP 122/86 | HR 78 | Temp 96.9°F | Ht 64.02 in | Wt 165.4 lb

## 2020-09-16 DIAGNOSIS — E538 Deficiency of other specified B group vitamins: Secondary | ICD-10-CM

## 2020-09-16 DIAGNOSIS — E559 Vitamin D deficiency, unspecified: Secondary | ICD-10-CM

## 2020-09-16 DIAGNOSIS — M62838 Other muscle spasm: Secondary | ICD-10-CM | POA: Diagnosis not present

## 2020-09-16 LAB — CBC WITH DIFFERENTIAL/PLATELET
Basophils Absolute: 0 10*3/uL (ref 0.0–0.1)
Basophils Relative: 0.4 % (ref 0.0–3.0)
Eosinophils Absolute: 0.2 10*3/uL (ref 0.0–0.7)
Eosinophils Relative: 3.1 % (ref 0.0–5.0)
HCT: 40.2 % (ref 36.0–46.0)
Hemoglobin: 13.9 g/dL (ref 12.0–15.0)
Lymphocytes Relative: 31.6 % (ref 12.0–46.0)
Lymphs Abs: 1.6 10*3/uL (ref 0.7–4.0)
MCHC: 34.5 g/dL (ref 30.0–36.0)
MCV: 94.7 fl (ref 78.0–100.0)
Monocytes Absolute: 0.5 10*3/uL (ref 0.1–1.0)
Monocytes Relative: 9.8 % (ref 3.0–12.0)
Neutro Abs: 2.9 10*3/uL (ref 1.4–7.7)
Neutrophils Relative %: 55.1 % (ref 43.0–77.0)
Platelets: 246 10*3/uL (ref 150.0–400.0)
RBC: 4.25 Mil/uL (ref 3.87–5.11)
RDW: 12.5 % (ref 11.5–15.5)
WBC: 5.2 10*3/uL (ref 4.0–10.5)

## 2020-09-16 LAB — COMPREHENSIVE METABOLIC PANEL
ALT: 14 U/L (ref 0–35)
AST: 12 U/L (ref 0–37)
Albumin: 4.1 g/dL (ref 3.5–5.2)
Alkaline Phosphatase: 51 U/L (ref 39–117)
BUN: 17 mg/dL (ref 6–23)
CO2: 31 mEq/L (ref 19–32)
Calcium: 9.3 mg/dL (ref 8.4–10.5)
Chloride: 100 mEq/L (ref 96–112)
Creatinine, Ser: 0.81 mg/dL (ref 0.40–1.20)
GFR: 73.44 mL/min (ref >60.00)
Glucose, Bld: 89 mg/dL (ref 70–99)
Potassium: 3.6 mEq/L (ref 3.5–5.1)
Sodium: 140 mEq/L (ref 135–145)
Total Bilirubin: 0.6 mg/dL (ref 0.2–1.2)
Total Protein: 6.9 g/dL (ref 6.0–8.3)

## 2020-09-16 LAB — B12 AND FOLATE PANEL
Folate: 11.9 ng/mL (ref >5.9)
Vitamin B-12: 424 pg/mL (ref 211–911)

## 2020-09-16 LAB — VITAMIN D 25 HYDROXY (VIT D DEFICIENCY, FRACTURES): VITD: 30.28 ng/mL (ref 30.00–100.00)

## 2020-09-16 MED ORDER — PREDNISONE 10 MG (21) PO TBPK: ORAL_TABLET | ORAL | 0 refills | Status: DC

## 2020-09-16 MED ORDER — CYCLOBENZAPRINE HCL 5 MG PO TABS: 5.0000 mg | ORAL_TABLET | Freq: Three times a day (TID) | ORAL | 1 refills | Status: DC | PRN

## 2020-09-16 NOTE — Patient Instructions (Signed)
Muscle Cramps and Spasms Muscle cramps and spasms are when muscles tighten by themselves. They usually get better within minutes. Muscle cramps are painful. They are usually stronger and last longer than muscle spasms. Muscle spasms may or may not be painful. They can last a few seconds or much longer. Cramps and spasms can affect any muscle, but they occur most often in the calf muscles of the leg. They are usually not caused by a serious problem. In many cases, the cause is not known. Some common causes include:  Doing more physical work or exercise than your body is ready for.  Using the muscles too much (overuse) by repeating certain movements too many times.  Staying in a certain position for a long time.  Playing a sport or doing an activity without preparing properly.  Using bad form or technique while playing a sport or doing an activity.  Not having enough water in your body (dehydration).  Injury.  Side effects of some medicines.  Low levels of the salts and minerals in your blood (electrolytes), such as low potassium or calcium. Follow these instructions at home: Managing pain and stiffness  Massage, stretch, and relax the muscle. Do this for many minutes at a time.  If told, put heat on tight or tense muscles as often as told by your doctor. Use the heat source that your doctor recommends, such as a moist heat pack or a heating pad. ? Place a towel between your skin and the heat source. ? Leave the heat on for 20-30 minutes. ? Remove the heat if your skin turns bright red. This is very important if you are not able to feel pain, heat, or cold. You may have a greater risk of getting burned.  If told, put ice on the affected area. This may help if you are sore or have pain after a cramp or spasm. ? Put ice in a plastic bag. ? Place a towel between your skin and the bag. ? Leave the ice on for 20 minutes, 2-3 times a day.  Try taking hot showers or baths to help relax  tight muscles.      Eating and drinking  Drink enough fluid to keep your pee (urine) pale yellow.  Eat a healthy diet to help ensure that your muscles work well. This should include: ? Fruits and vegetables. ? Lean protein. ? Whole grains. ? Low-fat or nonfat dairy products. General instructions  If you are having cramps often, avoid intense exercise for several days.  Take over-the-counter and prescription medicines only as told by your doctor.  Watch for any changes in your symptoms.  Keep all follow-up visits as told by your doctor. This is important. Contact a doctor if:  Your cramps or spasms get worse or happen more often.  Your cramps or spasms do not get better with time. Summary  Muscle cramps and spasms are when muscles tighten by themselves. They usually get better within minutes.  Cramps and spasms occur most often in the calf muscles of the leg.  Massage, stretch, and relax the muscle. This may help the cramp or spasm go away.  Drink enough fluid to keep your pee (urine) pale yellow. This information is not intended to replace advice given to you by your health care provider. Make sure you discuss any questions you have with your health care provider. Document Revised: 09/27/2017 Document Reviewed: 09/27/2017 Elsevier Patient Education  2021 [employer]  

## 2020-09-16 NOTE — Progress Notes (Signed)
Acute Office Visit  Subjective:    Patient ID: Shelley Zuniga, female    DOB: 03-Mar-1951, 70 y.o.   MRN: [id number]  Chief Complaint  Patient presents with  . Arm Pain    Pt c/o right upper back and arm pain x1week Pt states that it itched before it turned into pain.    HPI Patient is in today for right arm pain for one week, she feels she may have hit her elbow but having a bruise.  Right arm pain worse with movement.  Denies any edema or erythema.  She has taken arthritis tylenol and it does take the edge off of it.  Hurts with moving arm posterior and extension has some pain with raising her arm.  History of arthritis in Thumb.   Denies any neck pain.  Does not endorse any shoulder pain.    She is receiving Vitamin B 12 injections, she has no recollection of having a recent B 12 injection in that arm. She is due to have labs checked for B 12.  She is also now on Vitamin D over the counter for vitamin D deficiency.   Denies any jaw pain, chest pain, chest tightness or any other radiation of pain.  Patient  denies any fever,,chills, rash, chest pain, shortness of breath, nausea, vomiting, or diarrhea.  Denies dizziness, lightheadedness, pre syncopal or syncopal episodes.    Past Medical History:  Diagnosis Date  . B12 neuropathy (HCC) 10/20/2013  . Colon polyp 2001   colonoscopy, [facility]   . Headache    regularly, not migraines  . Heart murmur   . Hypertension   . Motion sickness   . PONV (postoperative nausea and vomiting)   . Strabismus age 72   eye surgery  . Wears hearing aid in both ears     Past Surgical History:  Procedure Laterality Date  . ABDOMINAL HYSTERECTOMY  2000   [place], [facility], fibroids/menorrhagia  . BASAL CELL CARCINOMA EXCISION Left 2013   Dr. Orson Aloe  . BILATERAL OOPHORECTOMY  2000   fibroid cysts  . CATARACT EXTRACTION W/PHACO Left 07/11/2019   Procedure: CATARACT EXTRACTION PHACO AND INTRAOCULAR LENS PLACEMENT (IOC) LEFT 8.41 00:49.8 ;   Surgeon: Galen Manila, MD;  Location: [facility];  Service: Ophthalmology;  Laterality: Left;  . CATARACT EXTRACTION W/PHACO Right 07/25/2019   Procedure: CATARACT EXTRACTION PHACO AND INTRAOCULAR LENS PLACEMENT (IOC) RIGHT TORIC LENS;  Surgeon: Galen Manila, MD;  Location: [facility];  Service: Ophthalmology;  Laterality: Right;  CDE 4.84 U/S 0:35.7  . EYE SURGERY  1956   to correct strabismus  . GALLBLADDER SURGERY    . HEMORRHOID SURGERY      Family History  Problem Relation Age of Onset  . Cancer Mother        melanoma, squamous cell on nose  . Hypertension Mother   . COPD Mother   . Hyperlipidemia Mother   . Cancer Father        lung, tobacco user  . Cancer Brother        throat, tobacco user  . Cancer Maternal Grandmother        uterine/ovarian    Social History   Socioeconomic History  . Marital status: Married    Spouse name: Not on file  . Number of children: Not on file  . Years of education: Not on file  . Highest education level: Not on file  Occupational History  . Not on file  Tobacco Use  .  Smoking status: Never Smoker  . Smokeless tobacco: Never Used  Vaping Use  . Vaping Use: Never used  Substance and Sexual Activity  . Alcohol use: No  . Drug use: No  . Sexual activity: Yes    Birth control/protection: Post-menopausal  Other Topics Concern  . Not on file  Social History Narrative  . Not on file   Social Determinants of Health   Financial Resource Strain: Low Risk   . Difficulty of Paying Living Expenses: Not hard at all  Food Insecurity: No Food Insecurity  . Worried About Programme researcher, broadcasting/film/video in the Last Year: Never true  . Ran Out of Food in the Last Year: Never true  Transportation Needs: No Transportation Needs  . Lack of Transportation (Medical): No  . Lack of Transportation (Non-Medical): No  Physical Activity: Not on file  Stress: No Stress Concern Present  . Feeling of Stress : Not at all  Social  Connections: Unknown  . Frequency of Communication with Friends and Family: Not on file  . Frequency of Social Gatherings with Friends and Family: Not on file  . Attends Religious Services: Not on file  . Active Member of Clubs or Organizations: Not on file  . Attends Banker Meetings: Not on file  . Marital Status: Married  Catering manager Violence: Not At Risk  . Fear of Current or Ex-Partner: No  . Emotionally Abused: No  . Physically Abused: No  . Sexually Abused: No    Outpatient Medications Prior to Visit  Medication Sig Dispense Refill  . ALPRAZolam (XANAX) 0.25 MG tablet Take 1 tablet (0.25 mg total) by mouth at bedtime as needed for anxiety. 30 tablet 5  . amLODipine (NORVASC) 5 MG tablet TAKE 1 TABLET(5 MG) BY MOUTH DAILY 90 tablet 3  . aspirin 81 MG tablet Take 81 mg by mouth daily.    Marland Kitchen atorvastatin (LIPITOR) 20 MG tablet TAKE 1 TABLET(20 MG) BY MOUTH DAILY 90 tablet 3  . buPROPion (WELLBUTRIN SR) 100 MG 12 hr tablet TAKE 1 TABLET BY MOUTH TWICE DAILY 180 tablet 3  . Cranberry 500 MG CAPS     . Docusate Calcium (STOOL SOFTENER PO) Take by mouth.    . estradiol (ESTRACE) 1 MG tablet Take 1 mg by mouth daily.    . fluocinonide cream (LIDEX) 0.05 % Apply 1 application topically as needed.    . fluticasone (FLONASE) 50 MCG/ACT nasal spray SPRAY 2 SPRAYS IN EACH NOSTRIL EVERY DAY 48 g 0  . hydrocortisone cream 0.5 % Apply 1 application topically 2 (two) times daily as needed for itching.    . losartan-hydrochlorothiazide (HYZAAR) 100-25 MG tablet Take 1 tablet by mouth daily. 90 tablet 3  . PREVIDENT 5000 DRY MOUTH 1.1 % GEL dental gel     . VITAMIN D PO Take 400 mg by mouth daily.    . vitamin E 400 UNIT capsule Take 400 Units by mouth daily.    Marland Kitchen albuterol (VENTOLIN HFA) 108 (90 Base) MCG/ACT inhaler Inhale 2 puffs into the lungs every 6 (six) hours as needed for wheezing or shortness of breath. (Patient not taking: Reported on 09/16/2020) 3.7 g 11  . azithromycin  (ZITHROMAX) 500 MG tablet Take 1 tablet (500 mg total) by mouth daily. 7 tablet 0  . benzonatate (TESSALON) 200 MG capsule Take 1 capsule (200 mg total) by mouth 3 (three) times daily as needed for cough. (Patient not taking: Reported on 09/16/2020) 60 capsule 1  . predniSONE (  DELTASONE) 10 MG tablet 6 tablets on Day 1 , then reduce by 1 tablet daily until gone 21 tablet 0   Facility-Administered Medications Prior to Visit  Medication Dose Route Frequency Provider Last Rate Last Admin  . methylPREDNISolone acetate (DEPO-MEDROL) injection 40 mg  40 mg Intramuscular Once Sherlene Shamsullo, Teresa L, MD        Allergies  Allergen Reactions  . Morphine And Related Itching  . Sulfa Antibiotics Swelling    Tongue    Review of Systems  Constitutional: Negative.   Respiratory: Negative.   Cardiovascular: Negative.   Gastrointestinal: Negative.   Genitourinary: Negative.   Musculoskeletal: Positive for arthralgias. Negative for back pain, neck pain and neck stiffness.  Neurological: Negative.   Hematological: Negative.   Psychiatric/Behavioral: Negative.        Objective:    Physical Exam Vitals and nursing note reviewed.  Constitutional:      Appearance: Normal appearance. She is not ill-appearing.     Comments: Patient is alert and oriented and responsive to questions Engages in eye contact with provider. Speaks in full sentences without any pauses without any shortness of breath or distress.    HENT:     Head: Normocephalic and atraumatic.     Right Ear: External ear normal.     Left Ear: External ear normal.     Nose: Nose normal.     Mouth/Throat:     Mouth: Mucous membranes are moist.  Eyes:     General: No scleral icterus.       Right eye: No discharge.        Left eye: No discharge.     Conjunctiva/sclera: Conjunctivae normal.     Pupils: Pupils are equal, round, and reactive to light.  Cardiovascular:     Rate and Rhythm: Normal rate and regular rhythm.     Pulses: Normal  pulses.     Heart sounds: Normal heart sounds. No murmur heard. No friction rub. No gallop.   Pulmonary:     Effort: Pulmonary effort is normal. No respiratory distress.     Breath sounds: Normal breath sounds. No stridor. No wheezing, rhonchi or rales.  Chest:     Chest wall: No tenderness.  Abdominal:     General: Bowel sounds are normal. There is no distension.     Palpations: Abdomen is soft.     Tenderness: There is no abdominal tenderness. There is no guarding.  Genitourinary:    Comments: Deferred.  Musculoskeletal:        General: No tenderness. Normal range of motion.     Right shoulder: Normal. No bony tenderness. Normal strength.     Left shoulder: Normal.     Right upper arm: Normal. No swelling, edema, deformity, lacerations, tenderness or bony tenderness.     Left upper arm: Normal. No swelling, edema, deformity, lacerations, tenderness or bony tenderness.     Right elbow: Normal.     Left elbow: Normal.     Right forearm: Normal. No tenderness or bony tenderness.     Left forearm: Normal.     Right wrist: Normal.     Left wrist: Normal.     Cervical back: Normal, normal range of motion and neck supple. No spasms or tenderness. Normal range of motion.     Thoracic back: Spasms (subscapular muscle spasm and tenderness right side. ) present.     Lumbar back: Normal.     Right lower leg: Normal. No edema.     Left  lower leg: Normal. No edema.     Comments: Pain with extension of arm posterior and lifting arm towards ceiling. Pain is reported under right scapula with these movements, no pain in arm with movement.    Skin:    General: Skin is warm.     Findings: No erythema, lesion or rash.          Comments: No rash or zoster.   Subscapular muscle spasm noted left side.    Neurological:     Mental Status: She is alert and oriented to person, place, and time.     Motor: No weakness.     Gait: Gait normal.  Psychiatric:        Mood and Affect: Mood normal.         Behavior: Behavior normal.        Thought Content: Thought content normal.        Judgment: Judgment normal.     BP 122/86 (BP Location: Left Arm, Patient Position: Sitting)   Pulse 78   Temp (!) 96.9 F (36.1 C)   Ht 5' 4.02" (1.626 m)   Wt 165 lb 6.4 oz (75 kg)   SpO2 96%   BMI 28.38 kg/m  Wt Readings from Last 3 Encounters:  09/16/20 165 lb 6.4 oz (75 kg)  06/13/20 166 lb (75.3 kg)  06/11/20 166 lb (75.3 kg)    Health Maintenance Due  Topic Date Due  . Fecal DNA (Cologuard)  11/17/2019  . COVID-19 Vaccine (4 - Booster for Pfizer series) 08/11/2020    There are no preventive care reminders to display for this patient.   Lab Results  Component Value Date   TSH 0.91 05/30/2018   Lab Results  Component Value Date   WBC 6.2 01/24/2019   HGB 14.1 01/24/2019   HCT 40.9 01/24/2019   MCV 95.2 01/24/2019   PLT 297.0 01/24/2019   Lab Results  Component Value Date   NA 137 03/14/2020   K 4.0 03/14/2020   CO2 31 03/14/2020   GLUCOSE 121 (H) 03/14/2020   BUN 14 03/14/2020   CREATININE 0.82 03/14/2020   BILITOT 0.7 03/14/2020   ALKPHOS 61 03/14/2020   AST 12 03/14/2020   ALT 14 03/14/2020   PROT 6.5 03/14/2020   ALBUMIN 4.1 03/14/2020   CALCIUM 9.1 03/14/2020   GFR 72.62 03/14/2020   Lab Results  Component Value Date   CHOL 154 03/14/2020   Lab Results  Component Value Date   HDL 63.60 03/14/2020   Lab Results  Component Value Date   LDLCALC 61 03/14/2020   Lab Results  Component Value Date   TRIG 146.0 03/14/2020   Lab Results  Component Value Date   CHOLHDL 2 03/14/2020   Lab Results  Component Value Date   HGBA1C 6.1 03/14/2020       Assessment & Plan:   Problem List Items Addressed This Visit      Other   B12 deficiency - Primary   Relevant Orders   B12 and Folate Panel   Vitamin D deficiency   Relevant Orders   VITAMIN D 25 Hydroxy (Vit-D Deficiency, Fractures)   Muscle spasm   Relevant Medications   predniSONE (STERAPRED  UNI-PAK 21 TAB) 10 MG (21) TBPK tablet   cyclobenzaprine (FLEXERIL) 5 MG tablet   Other Relevant Orders   CBC with Differential/Platelet   Comprehensive Metabolic Panel (CMET)     Suspect right sided sub- scapula pain due to muscle spasm, muscle is tight  and sore, no rash overlying. She will try prednisone dose back as well as muscle relaxer at bedtime. Rest, heat and ice advised alternation.  Seek care immediately if worsens.  Denies any cardiac symptoms and pain is reproducible with movement.   Red Flags discussed. The patient was given clear instructions to go to ER or return to medical center if any red flags develop, symptoms do not improve, worsen or new problems develop. They verbalized understanding.  May take Tylenol arthritis per package instructions and not to take NSAID's while on Prednisone.   Meds ordered this encounter  Medications  . predniSONE (STERAPRED UNI-PAK 21 TAB) 10 MG (21) TBPK tablet    Sig: PO: Take 6 tablets on day 1:Take 5 tablets day 2:Take 4 tablets day 3: Take 3 tablets day 4:Take 2 tablets day five: 5 Take 1 tablet day 6    Dispense:  21 tablet    Refill:  0  . cyclobenzaprine (FLEXERIL) 5 MG tablet    Sig: Take 1 tablet (5 mg total) by mouth 3 (three) times daily as needed for muscle spasms (will cause drowsiness.).    Dispense:  30 tablet    Refill:  1   Lab recheck of the following at today's visit.  Orders Placed This Encounter  Procedures  . B12 and Folate Panel  . CBC with Differential/Platelet  . VITAMIN D 25 Hydroxy (Vit-D Deficiency, Fractures)  . Comprehensive Metabolic Panel (CMET)   Advised patient call the office or your primary care doctor for an appointment if no improvement within 72 hours or if any symptoms change or worsen at any time  Advised ER or urgent Care if after hours or on weekend. Call 911 for emergency symptoms at any time.Patinet verbalized understanding of all instructions given/reviewed and treatment plan and has no  further questions or concerns at this time.    Return in about 2 weeks (around 09/30/2020), or if symptoms worsen or fail to improve, for at any time for any worsening symptoms, Go to Emergency room/ urgent care if worse.   Jairo Ben, FNP

## 2020-09-16 NOTE — Progress Notes (Signed)
CBC and CMP within normal limits.  Vitamin D is improved continue the over the counter supplement you are taking.  Vitamin B 12 is improved from 150 six months ago to 424  currently, she cab continue her scheduled B12 and should have follow up with Dr. Darrick Huntsman in around 3 months and will need recheck B12 at that time. History of positive intrinsic factor in past so she does not absorb oral B12.

## 2020-10-07 ENCOUNTER — Other Ambulatory Visit: Payer: Self-pay

## 2020-10-07 ENCOUNTER — Encounter: Payer: Self-pay | Admitting: Internal Medicine

## 2020-10-07 ENCOUNTER — Ambulatory Visit (INDEPENDENT_AMBULATORY_CARE_PROVIDER_SITE_OTHER): Payer: Medicare PPO | Admitting: Internal Medicine

## 2020-10-07 DIAGNOSIS — M62838 Other muscle spasm: Secondary | ICD-10-CM

## 2020-10-07 NOTE — Patient Instructions (Signed)
I agree with Marcelino Duster that your pain is coming from muscle spasm in your scapular region.  This is caused by overuse of the arm , likely in your work cleaning out your parent's house    You can use up to 2000 mg tylenol daily  In divided doses. along with 400 mg motrin (Aleve) or 440 mg of Aleve twice  Daily   When needed for muscle spasm   Try your husband's TENS UNIT:  These are great for muscles in spasm.  Start LOW and increase gradually\   If you develop persistent finger numbness,  Let me know

## 2020-10-07 NOTE — Progress Notes (Signed)
Subjective:  Patient ID: Shelley Zuniga, female    DOB: 01-05-1951  Age: 70 y.o. MRN: [id number]  CC: The encounter diagnosis was Muscle spasm.  HPI Shelley Zuniga presents for follow up on right shoulder pain with radiculopathy   This visit occurred during the SARS-CoV-2 public health emergency.  Safety protocols were in place, including screening questions prior to the visit, additional usage of staff PPE, and extensive cleaning of exam room while observing appropriate contact time as indicated for disinfecting solutions.   Patient was treated by NP Flinchum 2 weeks ago for Right arm and subscapular pain that was attributed to muscle spasm.  Her symptoms resolved after taking a prednsione pack and prn use of a MR.  She has been physically more active for the past several weeks cleaning out her deceased parent's home and believes that the strain of the cleanup job is the cause of her symptoms.  She denies any recent falls and denies neck pain .  She has very infrequent episodes of numbness involving the finger of her right hand but denies any weakness of hand.   Outpatient Medications Prior to Visit  Medication Sig Dispense Refill  . ALPRAZolam (XANAX) 0.25 MG tablet Take 1 tablet (0.25 mg total) by mouth at bedtime as needed for anxiety. 30 tablet 5  . amLODipine (NORVASC) 5 MG tablet TAKE 1 TABLET(5 MG) BY MOUTH DAILY 90 tablet 3  . aspirin 81 MG tablet Take 81 mg by mouth daily.    Marland Kitchen atorvastatin (LIPITOR) 20 MG tablet TAKE 1 TABLET(20 MG) BY MOUTH DAILY 90 tablet 3  . buPROPion (WELLBUTRIN SR) 100 MG 12 hr tablet TAKE 1 TABLET BY MOUTH TWICE DAILY 180 tablet 3  . Cranberry 500 MG CAPS     . Docusate Calcium (STOOL SOFTENER PO) Take by mouth.    . estradiol (ESTRACE) 1 MG tablet Take 1 mg by mouth daily.    . fluocinonide cream (LIDEX) 0.05 % Apply 1 application topically as needed.    . fluticasone (FLONASE) 50 MCG/ACT nasal spray SPRAY 2 SPRAYS IN EACH NOSTRIL EVERY DAY 48 g 0  .  hydrocortisone cream 0.5 % Apply 1 application topically 2 (two) times daily as needed for itching.    . losartan-hydrochlorothiazide (HYZAAR) 100-25 MG tablet Take 1 tablet by mouth daily. 90 tablet 3  . PREVIDENT 5000 DRY MOUTH 1.1 % GEL dental gel     . VITAMIN D PO Take 400 mg by mouth daily.    . vitamin E 400 UNIT capsule Take 400 Units by mouth daily.    . cyclobenzaprine (FLEXERIL) 5 MG tablet Take 1 tablet (5 mg total) by mouth 3 (three) times daily as needed for muscle spasms (will cause drowsiness.). (Patient not taking: Reported on 10/07/2020) 30 tablet 1  . albuterol (VENTOLIN HFA) 108 (90 Base) MCG/ACT inhaler Inhale 2 puffs into the lungs every 6 (six) hours as needed for wheezing or shortness of breath. (Patient not taking: No sig reported) 3.7 g 11  . azithromycin (ZITHROMAX) 500 MG tablet Take 1 tablet (500 mg total) by mouth daily. 7 tablet 0  . benzonatate (TESSALON) 200 MG capsule Take 1 capsule (200 mg total) by mouth 3 (three) times daily as needed for cough. (Patient not taking: No sig reported) 60 capsule 1  . predniSONE (STERAPRED UNI-PAK 21 TAB) 10 MG (21) TBPK tablet PO: Take 6 tablets on day 1:Take 5 tablets day 2:Take 4 tablets day 3: Take 3 tablets day 4:Take  2 tablets day five: 5 Take 1 tablet day 6 (Patient not taking: Reported on 10/07/2020) 21 tablet 0   Facility-Administered Medications Prior to Visit  Medication Dose Route Frequency Provider Last Rate Last Admin  . methylPREDNISolone acetate (DEPO-MEDROL) injection 40 mg  40 mg Intramuscular Once Sherlene Shams, MD        Review of Systems;  Patient denies headache, fevers, malaise, unintentional weight loss, skin rash, eye pain, sinus congestion and sinus pain, sore throat, dysphagia,  hemoptysis , cough, dyspnea, wheezing, chest pain, palpitations, orthopnea, edema, abdominal pain, nausea, melena, diarrhea, constipation, flank pain, dysuria, hematuria, urinary  Frequency, nocturia, numbness, tingling, seizures,   Focal weakness, Loss of consciousness,  Tremor, insomnia, depression, anxiety, and suicidal ideation.      Objective:  BP 136/72 (BP Location: Left Arm, Patient Position: Sitting, Cuff Size: Normal)   Pulse 76   Temp (!) 95.9 F (35.5 C) (Temporal)   Resp 15   Ht 5\' 4"  (1.626 m)   Wt 165 lb 9.6 oz (75.1 kg)   SpO2 98%   BMI 28.43 kg/m   BP Readings from Last 3 Encounters:  10/07/20 136/72  09/16/20 122/86  12/18/19 (!) 120/86    Wt Readings from Last 3 Encounters:  10/07/20 165 lb 9.6 oz (75.1 kg)  09/16/20 165 lb 6.4 oz (75 kg)  06/13/20 166 lb (75.3 kg)    General appearance: alert, cooperative and appears stated age Ears: normal TM's and external ear canals both ears Throat: lips, mucosa, and tongue normal; teeth and gums normal Neck: no adenopathy, no carotid bruit, supple, symmetrical, trachea midline and thyroid not enlarged, symmetric, no tenderness/mass/nodules Back: symmetric, no curvature. ROM normal. No CVA tenderness.  Muscle spasm of periscapular muscles including rhomboids noted  Lungs: clear to auscultation bilaterally Heart: regular rate and rhythm, S1, S2 normal, no murmur, click, rub or gallop Abdomen: soft, non-tender; bowel sounds normal; no masses,  no organomegaly Pulses: 2+ and symmetric Skin: Skin color, texture, turgor normal. No rashes or lesions Lymph nodes: Cervical, supraclavicular, and axillary nodes normal.  Lab Results  Component Value Date   HGBA1C 6.1 03/14/2020   HGBA1C 6.0 07/17/2019   HGBA1C 6.0 01/24/2019    Lab Results  Component Value Date   CREATININE 0.81 09/16/2020   CREATININE 0.82 03/14/2020   CREATININE 0.80 07/17/2019    Lab Results  Component Value Date   WBC 5.2 09/16/2020   HGB 13.9 09/16/2020   HCT 40.2 09/16/2020   PLT 246.0 09/16/2020   GLUCOSE 89 09/16/2020   CHOL 154 03/14/2020   TRIG 146.0 03/14/2020   HDL 63.60 03/14/2020   LDLDIRECT 72.0 05/15/2016   LDLCALC 61 03/14/2020   ALT 14 09/16/2020    AST 12 09/16/2020   NA 140 09/16/2020   K 3.6 09/16/2020   CL 100 09/16/2020   CREATININE 0.81 09/16/2020   BUN 17 09/16/2020   CO2 31 09/16/2020   TSH 0.91 05/30/2018   HGBA1C 6.1 03/14/2020   MICROALBUR <0.7 07/17/2019    No results found.  Assessment & Plan:   Problem List Items Addressed This Visit      Unprioritized   Muscle spasm    Involving the peri scapular muscles on the right due to repetitive lifting during cleanout of parent's home. Recommending trial of husband's TENS unit on area, as well as judicious use of NSAIDS /tylenol.         I have discontinued [place] Forker. Kirst's albuterol, benzonatate, azithromycin, and predniSONE. I  am also having her maintain her estradiol, vitamin E, aspirin, Docusate Calcium (STOOL SOFTENER PO), PreviDent 5000 Dry Mouth, Cranberry, VITAMIN D PO, ALPRAZolam, hydrocortisone cream, fluocinonide cream, fluticasone, buPROPion, amLODipine, losartan-hydrochlorothiazide, atorvastatin, and cyclobenzaprine. We will continue to administer methylPREDNISolone acetate.  No orders of the defined types were placed in this encounter.   Medications Discontinued During This Encounter  Medication Reason  . azithromycin (ZITHROMAX) 500 MG tablet   . benzonatate (TESSALON) 200 MG capsule Completed Course  . predniSONE (STERAPRED UNI-PAK 21 TAB) 10 MG (21) TBPK tablet Completed Course  . albuterol (VENTOLIN HFA) 108 (90 Base) MCG/ACT inhaler     Follow-up: Return in about 3 months (around 01/07/2021).   Sherlene Shams, MD

## 2020-10-08 NOTE — Assessment & Plan Note (Signed)
Involving the peri scapular muscles on the right due to repetitive lifting during cleanout of parent's home. Recommending trial of husband's TENS unit on area, as well as judicious use of NSAIDS /tylenol.

## 2020-10-10 ENCOUNTER — Other Ambulatory Visit: Payer: Self-pay

## 2020-10-10 ENCOUNTER — Encounter: Payer: Self-pay | Admitting: Emergency Medicine

## 2020-10-10 ENCOUNTER — Emergency Department: Payer: Medicare PPO

## 2020-10-10 ENCOUNTER — Emergency Department
Admission: EM | Admit: 2020-10-10 | Discharge: 2020-10-10 | Disposition: A | Discharge status: Home / Self Care | Payer: Medicare PPO | Attending: Emergency Medicine | Admitting: Emergency Medicine

## 2020-10-10 DIAGNOSIS — R109 Unspecified abdominal pain: Secondary | ICD-10-CM | POA: Insufficient documentation

## 2020-10-10 DIAGNOSIS — R1084 Generalized abdominal pain: Secondary | ICD-10-CM

## 2020-10-10 DIAGNOSIS — N281 Cyst of kidney, acquired: Secondary | ICD-10-CM | POA: Diagnosis not present

## 2020-10-10 DIAGNOSIS — Z79899 Other long term (current) drug therapy: Secondary | ICD-10-CM | POA: Diagnosis not present

## 2020-10-10 DIAGNOSIS — I700 Atherosclerosis of aorta: Secondary | ICD-10-CM | POA: Diagnosis not present

## 2020-10-10 DIAGNOSIS — Z8616 Personal history of COVID-19: Secondary | ICD-10-CM | POA: Diagnosis not present

## 2020-10-10 DIAGNOSIS — Z7982 Long term (current) use of aspirin: Secondary | ICD-10-CM | POA: Insufficient documentation

## 2020-10-10 DIAGNOSIS — K449 Diaphragmatic hernia without obstruction or gangrene: Secondary | ICD-10-CM | POA: Diagnosis not present

## 2020-10-10 DIAGNOSIS — I10 Essential (primary) hypertension: Secondary | ICD-10-CM | POA: Insufficient documentation

## 2020-10-10 DIAGNOSIS — Z9049 Acquired absence of other specified parts of digestive tract: Secondary | ICD-10-CM | POA: Diagnosis not present

## 2020-10-10 DIAGNOSIS — R100 Acute abdomen: Secondary | ICD-10-CM | POA: Diagnosis not present

## 2020-10-10 LAB — URINALYSIS, COMPLETE (UACMP) WITH MICROSCOPIC
Bacteria, UA: NONE SEEN
Bilirubin Urine: NEGATIVE
Glucose, UA: NEGATIVE mg/dL
Hgb urine dipstick: NEGATIVE
Ketones, ur: NEGATIVE mg/dL
Leukocytes,Ua: NEGATIVE
Nitrite: NEGATIVE
Protein, ur: NEGATIVE mg/dL
Specific Gravity, Urine: 1.015 (ref 1.005–1.030)
pH: 7 (ref 5.0–8.0)

## 2020-10-10 LAB — LIPASE, BLOOD: Lipase: 27 U/L (ref 11–51)

## 2020-10-10 LAB — COMPREHENSIVE METABOLIC PANEL
ALT: 15 U/L (ref 0–44)
AST: 18 U/L (ref 15–41)
Albumin: 3.7 g/dL (ref 3.5–5.0)
Alkaline Phosphatase: 58 U/L (ref 38–126)
Anion gap: 8 (ref 5–15)
BUN: 14 mg/dL (ref 8–23)
CO2: 27 mmol/L (ref 22–32)
Calcium: 9 mg/dL (ref 8.9–10.3)
Chloride: 104 mmol/L (ref 98–111)
Creatinine, Ser: 0.77 mg/dL (ref 0.44–1.00)
GFR, Estimated: >60 mL/min (ref >60)
Glucose, Bld: 146 mg/dL — ABNORMAL HIGH (ref 70–99)
Potassium: 3.6 mmol/L (ref 3.5–5.1)
Sodium: 139 mmol/L (ref 135–145)
Total Bilirubin: 0.6 mg/dL (ref 0.3–1.2)
Total Protein: 6.9 g/dL (ref 6.5–8.1)

## 2020-10-10 LAB — CBC
HCT: 39.2 % (ref 36.0–46.0)
Hemoglobin: 13.9 g/dL (ref 12.0–15.0)
MCH: 32.4 pg (ref 26.0–34.0)
MCHC: 35.5 g/dL (ref 30.0–36.0)
MCV: 91.4 fL (ref 80.0–100.0)
Platelets: 237 10*3/uL (ref 150–400)
RBC: 4.29 MIL/uL (ref 3.87–5.11)
RDW: 11.7 % (ref 11.5–15.5)
WBC: 5.7 10*3/uL (ref 4.0–10.5)
nRBC: 0 % (ref 0.0–0.2)

## 2020-10-10 MED ORDER — DOXYCYCLINE HYCLATE 100 MG PO TABS: 100.0000 mg | ORAL_TABLET | Freq: Two times a day (BID) | ORAL | 0 refills | Status: DC

## 2020-10-10 MED ORDER — DICYCLOMINE HCL 10 MG PO CAPS: 10.0000 mg | ORAL_CAPSULE | Freq: Three times a day (TID) | ORAL | 0 refills | Status: DC | PRN

## 2020-10-10 NOTE — ED Notes (Signed)
EDP at bedside  

## 2020-10-10 NOTE — ED Triage Notes (Signed)
Pt arrived via POV with reports of RUQ abd pain, pt states the pain has been present for several days, but states it has been constant since 1130pm.  Pt states changing position makes the pain better but only briefly.  Pt has had 2 BMs yesterday which she states is unusual for her.

## 2020-10-10 NOTE — ED Provider Notes (Addendum)
[facility] Emergency Department Provider Note  Time seen: 8:24 AM  I have reviewed the triage vital signs and the nursing notes.   HISTORY  Chief Complaint Abdominal Pain   HPI Shelley Zuniga is a 70 y.o. female with a past medical history of hypertension, presents to the emergency department for right-sided abdominal pain.  According to the patient over the past 3 days she has had intermittent right-sided abdominal pain which has now been constant over the past 24 hours.  Denies any nausea vomiting or diarrhea.  States 2 bowel movements yesterday.  Denies any dysuria or hematuria.  Patient is status postcholecystectomy years ago.  No fever.  Patient states moderate dull pain.   Past Medical History:  Diagnosis Date  . B12 neuropathy (HCC) 10/20/2013  . Colon polyp 2001   colonoscopy, [facility]   . Headache    regularly, not migraines  . Heart murmur   . Hypertension   . Motion sickness   . PONV (postoperative nausea and vomiting)   . Strabismus age 31   eye surgery  . Wears hearing aid in both ears     Patient Active Problem List   Diagnosis Date Noted  . Muscle spasm 09/16/2020  . Personal history of COVID-19 06/11/2020  . Osteoarthritis of thumb, left 12/18/2019  . Polyneuropathy in other diseases classified elsewhere (HCC) 12/18/2019  . Insomnia 01/07/2019  . Sciatica of left side associated with disorder of lumbar spine 11/21/2016  . Impaired fasting glucose 05/15/2016  . Major depressive disorder, recurrent episode with melancholic features (HCC) 11/15/2015  . Shingles outbreak 10/01/2014  . B12 deficiency 10/20/2013  . Vitamin D deficiency 10/20/2013  . Routine general medical examination at a health care facility 09/19/2012  . Screening for osteoporosis 04/14/2011  . Mixed hyperlipidemia 04/14/2011  . Screening for cervical cancer 04/13/2011  . Screening for breast cancer 04/13/2011  . Screening for colon cancer 04/13/2011  . Hypertension  03/27/2011    Past Surgical History:  Procedure Laterality Date  . ABDOMINAL HYSTERECTOMY  2000   [place], [facility], fibroids/menorrhagia  . BASAL CELL CARCINOMA EXCISION Left 2013   Dr. Orson Aloe  . BILATERAL OOPHORECTOMY  2000   fibroid cysts  . CATARACT EXTRACTION W/PHACO Left 07/11/2019   Procedure: CATARACT EXTRACTION PHACO AND INTRAOCULAR LENS PLACEMENT (IOC) LEFT 8.41 00:49.8 ;  Surgeon: Galen Manila, MD;  Location: [facility];  Service: Ophthalmology;  Laterality: Left;  . CATARACT EXTRACTION W/PHACO Right 07/25/2019   Procedure: CATARACT EXTRACTION PHACO AND INTRAOCULAR LENS PLACEMENT (IOC) RIGHT TORIC LENS;  Surgeon: Galen Manila, MD;  Location: [facility];  Service: Ophthalmology;  Laterality: Right;  CDE 4.84 U/S 0:35.7  . EYE SURGERY  1956   to correct strabismus  . GALLBLADDER SURGERY    . HEMORRHOID SURGERY      Prior to Admission medications   Medication Sig Start Date End Date Taking? Authorizing Provider  ALPRAZolam (XANAX) 0.25 MG tablet Take 1 tablet (0.25 mg total) by mouth at bedtime as needed for anxiety. 06/23/19   Sherlene Shams, MD  amLODipine (NORVASC) 5 MG tablet TAKE 1 TABLET(5 MG) BY MOUTH DAILY 12/13/19   Sherlene Shams, MD  aspirin 81 MG tablet Take 81 mg by mouth daily.    [provider]  atorvastatin (LIPITOR) 20 MG tablet TAKE 1 TABLET(20 MG) BY MOUTH DAILY 03/12/20   Sherlene Shams, MD  buPROPion (WELLBUTRIN SR) 100 MG 12 hr tablet TAKE 1 TABLET BY MOUTH TWICE DAILY  12/13/19   Sherlene Shams, MD  Cranberry 500 MG CAPS     [provider]  cyclobenzaprine (FLEXERIL) 5 MG tablet Take 1 tablet (5 mg total) by mouth 3 (three) times daily as needed for muscle spasms (will cause drowsiness.). Patient not taking: Reported on 10/07/2020 09/16/20   Flinchum, Eula Fried, FNP  Docusate Calcium (STOOL SOFTENER PO) Take by mouth.    [provider]  estradiol (ESTRACE) 1 MG tablet Take 1 mg by mouth daily.     [provider]  fluocinonide cream (LIDEX) 0.05 % Apply 1 application topically as needed.    [provider]  fluticasone (FLONASE) 50 MCG/ACT nasal spray SPRAY 2 SPRAYS IN EACH NOSTRIL EVERY DAY 11/23/19   Sherlene Shams, MD  hydrocortisone cream 0.5 % Apply 1 application topically 2 (two) times daily as needed for itching.    [provider]  losartan-hydrochlorothiazide (HYZAAR) 100-25 MG tablet Take 1 tablet by mouth daily. 02/26/20   Sherlene Shams, MD  PREVIDENT 5000 DRY MOUTH 1.1 % GEL dental gel  04/10/13   [provider]  VITAMIN D PO Take 400 mg by mouth daily.    [provider]  vitamin E 400 UNIT capsule Take 400 Units by mouth daily.    [provider]    Allergies  Allergen Reactions  . Morphine And Related Itching  . Sulfa Antibiotics Swelling    Tongue    Family History  Problem Relation Age of Onset  . Cancer Mother        melanoma, squamous cell on nose  . Hypertension Mother   . COPD Mother   . Hyperlipidemia Mother   . Cancer Father        lung, tobacco user  . Cancer Brother        throat, tobacco user  . Cancer Maternal Grandmother        uterine/ovarian    Social History Social History   Tobacco Use  . Smoking status: Never Smoker  . Smokeless tobacco: Never Used  Vaping Use  . Vaping Use: Never used  Substance Use Topics  . Alcohol use: No  . Drug use: No    Review of Systems Constitutional: Negative for fever. Cardiovascular: Negative for chest pain. Respiratory: Negative for shortness of breath. Gastrointestinal: Moderate dull right-sided abdominal pain.  Negative nausea vomiting or diarrhea Genitourinary: Negative for urinary compaints Musculoskeletal: Negative for musculoskeletal complaints Neurological: Negative for headache All other ROS negative  ____________________________________________   PHYSICAL EXAM:  VITAL SIGNS: ED Triage Vitals  Enc Vitals Group     BP  10/10/20 0530 (!) 141/79     Pulse Rate 10/10/20 0530 75     Resp 10/10/20 0530 18     Temp 10/10/20 0530 98 F (36.7 C)     Temp Source 10/10/20 0530 Oral     SpO2 10/10/20 0530 96 %     Weight --      Height --      Head Circumference --      Peak Flow --      Pain Score 10/10/20 0527 8     Pain Loc --      Pain Edu? --      Excl. in GC? --    Constitutional: Alert and oriented. Well appearing and in no distress. Eyes: Normal exam ENT      Head: Normocephalic and atraumatic.      Mouth/Throat: Mucous membranes are moist. Cardiovascular: Normal  rate, regular rhythm Respiratory: Normal respiratory effort without tachypnea nor retractions. Breath sounds are clear  Gastrointestinal: Soft, largely nontender.  Patient states very slight tenderness to the right upper quadrant.  No rebound guarding or distention.  No CVA tenderness. Musculoskeletal: Nontender with normal range of motion in all extremities.  Neurologic:  Normal speech and language. No gross focal neurologic deficits  Skin:  Skin is warm, dry and intact.  Psychiatric: Mood and affect are normal.   ____________________________________________    EKG  EKG viewed and interpreted by myself shows a normal sinus rhythm at 75 bpm with a narrow QRS, left axis deviation, largely normal intervals with nonspecific ST changes  ____________________________________________    RADIOLOGY  Scan shows mild biliary dilation otherwise largely normal.  Discussed pulmonary nodule recommendations.  ____________________________________________   INITIAL IMPRESSION / ASSESSMENT AND PLAN / ED COURSE  Pertinent labs & imaging results that were available during my care of the patient were reviewed by me and considered in my medical decision making (see chart for details).   Patient presents emergency department for right-sided abdominal pain intermittent over 3 days and now more constant today.  Overall the patient appears well minimal  tenderness on exam no rebound guarding or distention.  Lab work is reassuring including normal LFTs lipase white blood cell count and urinalysis.  Given the patient's complaint though however we will proceed with CT scan abdomen/pelvis to further evaluate.  Patient agreeable to plan of care.  Patient CT imaging is largely within normal limits mild biliary dilation however patient is status postcholecystectomy and has normal LFTs, lipase and total bilirubin.  Given the patient's reassuring exam discussed with the patient a trial of Bentyl and PCP follow-up.  Also discussed my typical abdominal pain return precautions.  As a secondary complaint patient states she was bit by a tick 4 days ago to the lower right abdomen.  I evaluated this area patient does have an approximate 1.5 cm diameter area of erythema surrounding the bite site.  No obvious portion of the tick remains within the bite site.  We will cover with a short course of doxycycline as a precaution and patient will follow up with her doctor.  Shelley Zuniga was evaluated in Emergency Department on 10/10/2020 for the symptoms described in the history of present illness. She was evaluated in the context of the global COVID-19 pandemic, which necessitated consideration that the patient might be at risk for infection with the SARS-CoV-2 virus that causes COVID-19. Institutional protocols and algorithms that pertain to the evaluation of patients at risk for COVID-19 are in a state of rapid change based on information released by regulatory bodies including the CDC and federal and state organizations. These policies and algorithms were followed during the patient's care in the ED.  ____________________________________________   FINAL CLINICAL IMPRESSION(S) / ED DIAGNOSES  Right-sided abdominal pain   Minna Antis, MD 10/10/20 [id number]    Minna Antis, MD 10/10/20 [phone]

## 2020-10-10 NOTE — ED Notes (Signed)
Pt to ED c/o RUQ discomfort and tightness intermittently for past 2 weeks that got worse last night. Pt had cholecystectomy in 2020. Pt also reports recent tick bite on abdomen, 4d ago (small tick). Area appears reddened.  Pt in NAD. Husband at bedside. Warm blankets provided.

## 2020-10-11 ENCOUNTER — Ambulatory Visit (INDEPENDENT_AMBULATORY_CARE_PROVIDER_SITE_OTHER): Payer: Medicare PPO

## 2020-10-11 DIAGNOSIS — E538 Deficiency of other specified B group vitamins: Secondary | ICD-10-CM

## 2020-10-11 MED ORDER — CYANOCOBALAMIN 1000 MCG/ML IJ SOLN
1000.0000 ug | Freq: Once | INTRAMUSCULAR | Status: AC
  Administered 2020-10-11: 1000 ug via INTRAMUSCULAR

## 2020-10-11 NOTE — Progress Notes (Signed)
Patient presented for B 12 injection to right deltoid, patient voiced no concerns nor showed any signs of distress during injection. 

## 2020-10-18 ENCOUNTER — Other Ambulatory Visit: Payer: Self-pay | Admitting: Internal Medicine

## 2020-10-21 ENCOUNTER — Encounter: Payer: Self-pay | Admitting: Internal Medicine

## 2020-10-21 ENCOUNTER — Ambulatory Visit: Payer: Medicare PPO | Admitting: Internal Medicine

## 2020-10-21 ENCOUNTER — Encounter: Payer: Self-pay | Admitting: *Deleted

## 2020-10-21 ENCOUNTER — Other Ambulatory Visit: Payer: Self-pay

## 2020-10-21 VITALS — BP 134/86 | HR 81 | Temp 98.4°F | Ht 64.02 in | Wt 165.0 lb

## 2020-10-21 DIAGNOSIS — I10 Essential (primary) hypertension: Secondary | ICD-10-CM

## 2020-10-21 DIAGNOSIS — S30861D Insect bite (nonvenomous) of abdominal wall, subsequent encounter: Secondary | ICD-10-CM

## 2020-10-21 DIAGNOSIS — R918 Other nonspecific abnormal finding of lung field: Secondary | ICD-10-CM | POA: Diagnosis not present

## 2020-10-21 DIAGNOSIS — R1011 Right upper quadrant pain: Secondary | ICD-10-CM

## 2020-10-21 DIAGNOSIS — W57XXXD Bitten or stung by nonvenomous insect and other nonvenomous arthropods, subsequent encounter: Secondary | ICD-10-CM | POA: Diagnosis not present

## 2020-10-21 DIAGNOSIS — M62838 Other muscle spasm: Secondary | ICD-10-CM

## 2020-10-21 DIAGNOSIS — I700 Atherosclerosis of aorta: Secondary | ICD-10-CM | POA: Diagnosis not present

## 2020-10-21 NOTE — Assessment & Plan Note (Signed)
Reviewed findings of prior CT scan today..  Patient is taking atorvastatin 20 mg daily

## 2020-10-21 NOTE — Progress Notes (Signed)
Subjective:  Patient ID: Diego Cory, female    DOB: [birth year]  Age: 70 y.o. MRN: [id number]  CC: The primary encounter diagnosis was Postprandial RUQ pain. Diagnoses of Abdominal aortic atherosclerosis (HCC), Pulmonary nodules, Primary hypertension, RUQ abdominal pain, Muscle spasm, and Tick bite of abdominal wall, subsequent encounter were also pertinent to this visit.  HPI CALYSTA CRAIGO presents for follow up on multiple issues :  1)  Neck/right arm  pain attributed to muscle spasm, 2)  ER Visit May 26 for abd pain and tick bite   This visit occurred during the SARS-CoV-2 public health emergency.  Safety protocols were in place, including screening questions prior to the visit, additional usage of staff PPE, and extensive cleaning of exam room while observing appropriate contact time as indicated for disinfecting solutions.   1) neck and arm pain have resolved.  2) 70 yr female with history of remote cholecystectomy ,  treated in ER on May 26 for persistent RUQ pain lasting over 12 hours.  CT abd and pelvis done and reviewed today:  Bilateral sub centimeter pulmonary nodules  Aortic atherosclerosis,   regarding the liver : "There appears to be ome left intrahepatic biliary dilatation. The extrahepatic bile duct is prominent for size measuring up to 1.2 cm on sequence 2 image 32" but liver enzymes were normal.  MRCP recommended if pain continues in RUQ   Her continual ruq pain lasted 3 more days after ER visit  And did not change with trial of dicyclomine given by ED physician , then  resolved during the daytime  and returned only  at night for 3 more nights.  RUQ area was not tender to patient's palpation. There was no skin color change  Tea colored urine, nausea , vomiting or change in bowel patter/consistency/color   3) Tick bite of abdominal wall:  She was prescribed doxycycline by EDP for suspicion of cellulitis.  She states that the appearance has not changed.   Outpatient  Medications Prior to Visit  Medication Sig Dispense Refill  . ALPRAZolam (XANAX) 0.25 MG tablet Take 1 tablet (0.25 mg total) by mouth at bedtime as needed for anxiety. 30 tablet 5  . amLODipine (NORVASC) 5 MG tablet TAKE 1 TABLET(5 MG) BY MOUTH DAILY 90 tablet 3  . aspirin 81 MG tablet Take 81 mg by mouth daily.    Marland Kitchen atorvastatin (LIPITOR) 20 MG tablet TAKE 1 TABLET(20 MG) BY MOUTH DAILY 90 tablet 3  . buPROPion (WELLBUTRIN SR) 100 MG 12 hr tablet TAKE 1 TABLET BY MOUTH TWICE DAILY 180 tablet 3  . Cranberry 500 MG CAPS     . cyclobenzaprine (FLEXERIL) 5 MG tablet Take 1 tablet (5 mg total) by mouth 3 (three) times daily as needed for muscle spasms (will cause drowsiness.). (Patient not taking: No sig reported) 30 tablet 1  . Docusate Calcium (STOOL SOFTENER PO) Take by mouth.    . doxycycline (VIBRA-TABS) 100 MG tablet Take 1 tablet (100 mg total) by mouth 2 (two) times daily. (Patient not taking: Reported on 10/21/2020) 14 tablet 0  . estradiol (ESTRACE) 1 MG tablet Take 1 mg by mouth daily.    . fluocinonide cream (LIDEX) 0.05 % Apply 1 application topically as needed.    . fluticasone (FLONASE) 50 MCG/ACT nasal spray SPRAY 2 SPRAYS IN EACH NOSTRIL EVERY DAY 48 g 0  . hydrocortisone cream 0.5 % Apply 1 application topically 2 (two) times daily as needed for itching.    . losartan-hydrochlorothiazide (  HYZAAR) 100-25 MG tablet Take 1 tablet by mouth daily. 90 tablet 3  . PREVIDENT 5000 DRY MOUTH 1.1 % GEL dental gel     . VITAMIN D PO Take 400 mg by mouth daily.    . vitamin E 400 UNIT capsule Take 400 Units by mouth daily.    Marland Kitchen dicyclomine (BENTYL) 10 MG capsule Take 1 capsule (10 mg total) by mouth 3 (three) times daily as needed for up to 14 days for spasms. (Patient not taking: Reported on 10/21/2020) 20 capsule 0   Facility-Administered Medications Prior to Visit  Medication Dose Route Frequency Provider Last Rate Last Admin  . methylPREDNISolone acetate (DEPO-MEDROL) injection 40 mg  40 mg  Intramuscular Once Sherlene Shams, MD        Review of Systems;  Patient denies headache, fevers, malaise, unintentional weight loss, skin rash, eye pain, sinus congestion and sinus pain, sore throat, dysphagia,  hemoptysis , cough, dyspnea, wheezing, chest pain, palpitations, orthopnea, edema, abdominal pain, nausea, melena, diarrhea, constipation, flank pain, dysuria, hematuria, urinary  Frequency, nocturia, numbness, tingling, seizures,  Focal weakness, Loss of consciousness,  Tremor, insomnia, depression, anxiety, and suicidal ideation.      Objective:  BP 134/86   Pulse 81   Temp 98.4 F (36.9 C)   Ht 5' 4.02" (1.626 m)   Wt 165 lb (74.8 kg)   SpO2 97%   BMI 28.31 kg/m   BP Readings from Last 3 Encounters:  10/21/20 134/86  10/10/20 (!) 149/127  10/07/20 136/72    Wt Readings from Last 3 Encounters:  10/21/20 165 lb (74.8 kg)  10/07/20 165 lb 9.6 oz (75.1 kg)  09/16/20 165 lb 6.4 oz (75 kg)    General appearance: alert, cooperative and appears stated age Ears: normal TM's and external ear canals both ears Throat: lips, mucosa, and tongue normal; teeth and gums normal Neck: no adenopathy, no carotid bruit, supple, symmetrical, trachea midline and thyroid not enlarged, symmetric, no tenderness/mass/nodules Back: symmetric, no curvature. ROM normal. No CVA tenderness. Lungs: clear to auscultation bilaterally Heart: regular rate and rhythm, S1, S2 normal, no murmur, click, rub or gallop Abdomen: soft, tender to deep palpation in RUQ with out guarding ; bowel sounds normal; no masses,  no organomegaly Pulses: 2+ and symmetric Skin: resolving tick bite on abd wall,  No erythema.  Skin color, texture, turgor normal. No rashes or lesions Lymph nodes: Cervical, supraclavicular, and axillary nodes normal.  Lab Results  Component Value Date   HGBA1C 6.1 03/14/2020   HGBA1C 6.0 07/17/2019   HGBA1C 6.0 01/24/2019    Lab Results  Component Value Date   CREATININE 0.77  10/10/2020   CREATININE 0.81 09/16/2020   CREATININE 0.82 03/14/2020    Lab Results  Component Value Date   WBC 5.7 10/10/2020   HGB 13.9 10/10/2020   HCT 39.2 10/10/2020   PLT 237 10/10/2020   GLUCOSE 146 (H) 10/10/2020   CHOL 154 03/14/2020   TRIG 146.0 03/14/2020   HDL 63.60 03/14/2020   LDLDIRECT 72.0 05/15/2016   LDLCALC 61 03/14/2020   ALT 15 10/10/2020   AST 18 10/10/2020   NA 139 10/10/2020   K 3.6 10/10/2020   CL 104 10/10/2020   CREATININE 0.77 10/10/2020   BUN 14 10/10/2020   CO2 27 10/10/2020   TSH 0.91 05/30/2018   HGBA1C 6.1 03/14/2020   MICROALBUR <0.7 07/17/2019    CT ABDOMEN PELVIS WO CONTRAST  Result Date: 10/10/2020 CLINICAL DATA:  70 year old with acute abdominal  pain. EXAM: CT ABDOMEN AND PELVIS WITHOUT CONTRAST TECHNIQUE: Multidetector CT imaging of the abdomen and pelvis was performed following the standard protocol without IV contrast. COMPARISON:  None. FINDINGS: Lower chest: 2 small nodules in the right middle lobe. Peripheral nodule in the left lower lobe measures up to 6 mm on sequence 3, image 14 and sequence 5 image 54. No pleural effusions. Hepatobiliary: Gallbladder has been removed. There appears to be some left intrahepatic biliary dilatation. The extrahepatic bile duct is prominent for size measuring up to 1.2 cm on sequence 2 image 32. Limited evaluation for the liver on this exam without intravascular contrast. Pancreas: Unremarkable. No pancreatic ductal dilatation or surrounding inflammatory changes. Spleen: Normal in size without focal abnormality. Adrenals/Urinary Tract: Normal adrenal glands. Normal appearance of the urinary bladder. Negative for kidney stones. Hypodensities in the right kidney are suggestive for renal cysts. Largest right renal cyst is in the lower pole and measures 5.5 cm. Stomach/Bowel: Small hiatal hernia. No bowel dilatation and no focal bowel inflammation. Normal appendix. Vascular/Lymphatic: Atherosclerotic  calcifications in the abdominal aorta without aneurysm. No lymph node enlargement in the abdomen or pelvis. Reproductive: Status post hysterectomy. No adnexal masses. Other: Negative for free fluid.  Negative for free air. Musculoskeletal: Disc space narrowing and endplate changes at L5-S1. IMPRESSION: 1. No acute abnormality in the abdomen or pelvis. 2. Mild biliary dilatation. The common bile duct measures up to 1.2 cm and there is probably some left intrahepatic biliary dilatation. These findings could be secondary to cholecystectomy but indeterminate. Recommend correlation with liver enzymes. If this is an area of clinical concern, this could be further characterized with right upper quadrant ultrasound or MRCP. 3. Indeterminate left pulmonary nodule measuring up to 6 mm. No comparison imaging to evaluate for stability. Non-contrast chest CT at 6-12 months is recommended. If the nodule is stable at time of repeat CT, then future CT at 18-24 months (from today's scan) is considered optional for low-risk patients, but is recommended for high-risk patients. This recommendation follows the consensus statement: Guidelines for Management of Incidental Pulmonary Nodules Detected on CT Images: From the Fleischner Society 2017; Radiology 2017; 284:228-243. 4.  Aortic Atherosclerosis (ICD10-I70.0). 5. Right renal cysts. Electronically Signed   By: Richarda Overlie M.D.   On: 10/10/2020 09:11    Assessment & Plan:   Problem List Items Addressed This Visit      Unprioritized   Abdominal aortic atherosclerosis (HCC)    Reviewed findings of prior CT scan today..  Patient is taking atorvastatin 20 mg daily       Hypertension    Well controlled on current regimen of amlodipine, losartan /hct . Renal function stable, no changes today.      Muscle spasm    Her neck pain and right arm pain have resolved with use of MR and analgesics.      Pulmonary nodules    Incidental findings on CT abd.  6 mm nodule on the left,   2 smaller ones on the right. No history of tobacco abuse.  Refer to Pulm nodule clinic       Relevant Orders   AMB  Referral to Pulmonary Nodule Clinic   RUQ abdominal pain    Reviewed symptoms,  ER visit with CT and labs. No evidence of pancreatitis.  However,  Given the intra and extra hepatic biliary dilation  On CT,  The presence of pulmonary nodules on CT,   and the symptoms that persisted for several more days,  Discussed option of additional imaging with [place] vs MRCP .  MRCP preferred and ordered   Lab Results  Component Value Date   LIPASE 27 10/10/2020         Tick bite of abdominal wall    Resolved.  Refill on doxycycline given for next occurrence         Other Visit Diagnoses    Postprandial RUQ pain    -  Primary   Relevant Orders   MR ABDOMEN MRCP W WO CONTAST      I have discontinued Pierce CraneKaren K. Scarlett's dicyclomine. I am also having her maintain her estradiol, vitamin E, aspirin, Docusate Calcium (STOOL SOFTENER PO), PreviDent 5000 Dry Mouth, Cranberry, VITAMIN D PO, ALPRAZolam, hydrocortisone cream, fluocinonide cream, fluticasone, amLODipine, losartan-hydrochlorothiazide, atorvastatin, cyclobenzaprine, doxycycline, and buPROPion. We will continue to administer methylPREDNISolone acetate.  No orders of the defined types were placed in this encounter.   Medications Discontinued During This Encounter  Medication Reason  . dicyclomine (BENTYL) 10 MG capsule     Follow-up: No follow-ups on file.   Sherlene Shamseresa L Lori-Ann Lindfors, MD

## 2020-10-21 NOTE — Patient Instructions (Signed)
We reviewed the findings on your CT Scan.  I recommend the following:  1) Continue atorvastatin and aspirin for the aortic atherosclerosis  2) Follow up with the [facility] to provide surveillance of the nodule (referral in progress)  3) MRCP to evaluate the liver in more detail (imaging study  Ordered today)  4)  Hiatal hernia (see below)  5) Renal cysts:  Incidental finding,  No follow up needed   Hiatal Hernia  A hiatal hernia occurs when part of the stomach slides above the muscle that separates the abdomen from the chest (diaphragm). A person can be born with a hiatal hernia (congenital), or it may develop over time. In almost all cases of hiatal hernia, only the top part of the stomach pushes through the diaphragm. Many people have a hiatal hernia with no symptoms. The larger the hernia, the more likely it is that you will have symptoms. In some cases, a hiatal hernia allows stomach acid to flow back into the tube that carries food from your mouth to your stomach (esophagus). This may cause heartburn symptoms. Severe heartburn symptoms may mean that you have developed a condition called gastroesophageal reflux disease (GERD). What are the causes? This condition is caused by a weakness in the opening (hiatus) where the esophagus passes through the diaphragm to attach to the upper part of the stomach. A person may be born with a weakness in the hiatus, or a weakness can develop over time. What increases the risk? This condition is more likely to develop in:  Older people. Age is a major risk factor for a hiatal hernia, especially if you are over the age of 28.  Pregnant women.  People who are overweight.  People who have frequent constipation. What are the signs or symptoms? Symptoms of this condition usually develop in the form of GERD symptoms. Symptoms include:  Heartburn.  Belching.  Indigestion.  Trouble swallowing.  Coughing or wheezing.  Sore  throat.  Hoarseness.  Chest pain.  Nausea and vomiting. How is this diagnosed? This condition may be diagnosed during testing for GERD. Tests that may be done include:  X-rays of your stomach or chest.  An upper gastrointestinal (GI) series. This is an X-ray exam of your GI tract that is taken after you swallow a chalky liquid that shows up clearly on the X-ray.  Endoscopy. This is a procedure to look into your stomach using a thin, flexible tube that has a tiny camera and light on the end of it. How is this treated? This condition may be treated by:  Dietary and lifestyle changes to help reduce GERD symptoms.  Medicines. These may include: ? Over-the-counter antacids. ? Medicines that make your stomach empty more quickly. ? Medicines that block the production of stomach acid (H2 blockers). ? Stronger medicines to reduce stomach acid (proton pump inhibitors).  Surgery to repair the hernia, if other treatments are not helping. If you have no symptoms, you may not need treatment. Follow these instructions at home: Lifestyle and activity  Do not use any products that contain nicotine or tobacco, such as cigarettes and e-cigarettes. If you need help quitting, ask your health care provider.  Try to achieve and maintain a healthy body weight.  Avoid putting pressure on your abdomen. Anything that puts pressure on your abdomen increases the amount of acid that may be pushed up into your esophagus. ? Avoid bending over, especially after eating. ? Raise the head of your bed by putting blocks  under the legs. This keeps your head and esophagus higher than your stomach. ? Do not wear tight clothing around your chest or stomach. ? Try not to strain when having a bowel movement, when urinating, or when lifting heavy objects. Eating and drinking  Avoid foods that can worsen GERD symptoms. These may include: ? Fatty foods, like fried foods. ? Citrus fruits, like oranges or lemon. ? Other  foods and drinks that contain acid, like orange juice or tomatoes. ? Spicy food. ? Chocolate.  Eat frequent small meals instead of three large meals a day. This helps prevent your stomach from getting too full. ? Eat slowly. ? Do not lie down right after eating. ? Do not eat 1-2 hours before bed.  Do not drink beverages with caffeine. These include cola, coffee, cocoa, and tea.  Do not drink alcohol. General instructions  Take over-the-counter and prescription medicines only as told by your health care provider.  Keep all follow-up visits as told by your health care provider. This is important. Contact a health care provider if:  Your symptoms are not controlled with medicines or lifestyle changes.  You are having trouble swallowing.  You have coughing or wheezing that will not go away. Get help right away if:  Your pain is getting worse.  Your pain spreads to your arms, neck, jaw, teeth, or back.  You have shortness of breath.  You sweat for no reason.  You feel sick to your stomach (nauseous) or you vomit.  You vomit blood.  You have bright red blood in your stools.  You have black, tarry stools. This information is not intended to replace advice given to you by your health care provider. Make sure you discuss any questions you have with your health care provider. Document Revised: 04/16/2017 Document Reviewed: 12/07/2016 Elsevier Patient Education  2021 [employer].

## 2020-10-22 DIAGNOSIS — S30861A Insect bite (nonvenomous) of abdominal wall, initial encounter: Secondary | ICD-10-CM

## 2020-10-22 DIAGNOSIS — W57XXXA Bitten or stung by nonvenomous insect and other nonvenomous arthropods, initial encounter: Secondary | ICD-10-CM

## 2020-10-22 DIAGNOSIS — R1011 Right upper quadrant pain: Secondary | ICD-10-CM | POA: Insufficient documentation

## 2020-10-22 DIAGNOSIS — R918 Other nonspecific abnormal finding of lung field: Secondary | ICD-10-CM | POA: Insufficient documentation

## 2020-10-22 HISTORY — DX: Insect bite (nonvenomous) of abdominal wall, initial encounter: S30.861A

## 2020-10-22 HISTORY — DX: Bitten or stung by nonvenomous insect and other nonvenomous arthropods, initial encounter: W57.XXXA

## 2020-10-22 NOTE — Assessment & Plan Note (Addendum)
Reviewed symptoms,  ER visit with CT and labs. No evidence of pancreatitis.  However,  Given the intra and extra hepatic biliary dilation  On CT,  The presence of pulmonary nodules on CT,   and the symptoms that persisted for several more days,  Discussed option of additional imaging with [place] vs MRCP .  MRCP preferred and ordered   Lab Results  Component Value Date   LIPASE 27 10/10/2020

## 2020-10-22 NOTE — Assessment & Plan Note (Signed)
Resolved.  Refill on doxycycline given for next occurrence

## 2020-10-22 NOTE — Assessment & Plan Note (Signed)
Incidental findings on CT abd.  6 mm nodule on the left,  2 smaller ones on the right. No history of tobacco abuse.  Refer to [facility] nodule clinic

## 2020-10-22 NOTE — Assessment & Plan Note (Signed)
Her neck pain and right arm pain have resolved with use of MR and analgesics.

## 2020-10-22 NOTE — Assessment & Plan Note (Signed)
Well controlled on current regimen of amlodipine, losartan /hct . Renal function stable, no changes today.

## 2020-11-04 ENCOUNTER — Ambulatory Visit
Admission: RE | Admit: 2020-11-04 | Discharge: 2020-11-04 | Disposition: A | Discharge status: Home / Self Care | Payer: Medicare PPO | Source: Ambulatory Visit | Attending: Internal Medicine | Admitting: Internal Medicine

## 2020-11-04 ENCOUNTER — Other Ambulatory Visit: Payer: Self-pay

## 2020-11-04 DIAGNOSIS — R935 Abnormal findings on diagnostic imaging of other abdominal regions, including retroperitoneum: Secondary | ICD-10-CM | POA: Diagnosis not present

## 2020-11-04 DIAGNOSIS — K838 Other specified diseases of biliary tract: Secondary | ICD-10-CM | POA: Diagnosis not present

## 2020-11-04 DIAGNOSIS — R1011 Right upper quadrant pain: Secondary | ICD-10-CM

## 2020-11-04 DIAGNOSIS — N281 Cyst of kidney, acquired: Secondary | ICD-10-CM | POA: Diagnosis not present

## 2020-11-04 DIAGNOSIS — Z9049 Acquired absence of other specified parts of digestive tract: Secondary | ICD-10-CM | POA: Diagnosis not present

## 2020-11-04 MED ORDER — GADOBUTROL 1 MMOL/ML IV SOLN
7.0000 mL | Freq: Once | INTRAVENOUS | Status: AC | PRN
  Administered 2020-11-04: 7 mL via INTRAVENOUS

## 2020-11-12 ENCOUNTER — Other Ambulatory Visit: Payer: Self-pay

## 2020-11-12 ENCOUNTER — Ambulatory Visit (INDEPENDENT_AMBULATORY_CARE_PROVIDER_SITE_OTHER): Payer: Medicare PPO

## 2020-11-12 DIAGNOSIS — E538 Deficiency of other specified B group vitamins: Secondary | ICD-10-CM

## 2020-11-12 MED ORDER — CYANOCOBALAMIN 1000 MCG/ML IJ SOLN
1000.0000 ug | Freq: Once | INTRAMUSCULAR | Status: AC
  Administered 2020-11-12: 1000 ug via INTRAMUSCULAR

## 2020-11-12 NOTE — Progress Notes (Signed)
Patient presented for B 12 injection to left deltoid, patient voiced no concerns nor showed any signs of distress during injection. 

## 2020-12-12 ENCOUNTER — Ambulatory Visit: Payer: Medicare PPO

## 2020-12-24 ENCOUNTER — Ambulatory Visit (INDEPENDENT_AMBULATORY_CARE_PROVIDER_SITE_OTHER): Payer: Medicare PPO

## 2020-12-24 ENCOUNTER — Other Ambulatory Visit: Payer: Self-pay

## 2020-12-24 DIAGNOSIS — E538 Deficiency of other specified B group vitamins: Secondary | ICD-10-CM

## 2020-12-24 MED ORDER — CYANOCOBALAMIN 1000 MCG/ML IJ SOLN
1000.0000 ug | Freq: Once | INTRAMUSCULAR | Status: AC
  Administered 2020-12-24: 1000 ug via INTRAMUSCULAR

## 2020-12-24 NOTE — Progress Notes (Signed)
Patient presented for B 12 injection to left deltoid, patient voiced no concerns nor showed any signs of distress during injection. 

## 2021-01-28 ENCOUNTER — Ambulatory Visit (INDEPENDENT_AMBULATORY_CARE_PROVIDER_SITE_OTHER): Payer: Medicare PPO

## 2021-01-28 ENCOUNTER — Other Ambulatory Visit: Payer: Self-pay

## 2021-01-28 DIAGNOSIS — E538 Deficiency of other specified B group vitamins: Secondary | ICD-10-CM

## 2021-01-28 MED ORDER — CYANOCOBALAMIN 1000 MCG/ML IJ SOLN
1000.0000 ug | Freq: Once | INTRAMUSCULAR | Status: AC
  Administered 2021-01-28: 1000 ug via INTRAMUSCULAR

## 2021-01-28 NOTE — Progress Notes (Signed)
Patient presented for B 12 injection to right deltoid, patient voiced no concerns nor showed any signs of distress during injection. 

## 2021-02-14 DIAGNOSIS — J45909 Unspecified asthma, uncomplicated: Secondary | ICD-10-CM | POA: Diagnosis not present

## 2021-02-14 DIAGNOSIS — Z01419 Encounter for gynecological examination (general) (routine) without abnormal findings: Secondary | ICD-10-CM | POA: Diagnosis not present

## 2021-02-14 DIAGNOSIS — Z23 Encounter for immunization: Secondary | ICD-10-CM | POA: Diagnosis not present

## 2021-02-14 DIAGNOSIS — I10 Essential (primary) hypertension: Secondary | ICD-10-CM | POA: Diagnosis not present

## 2021-02-14 DIAGNOSIS — Z1231 Encounter for screening mammogram for malignant neoplasm of breast: Secondary | ICD-10-CM | POA: Diagnosis not present

## 2021-02-14 LAB — HM MAMMOGRAPHY

## 2021-02-24 ENCOUNTER — Other Ambulatory Visit: Payer: Self-pay | Admitting: Internal Medicine

## 2021-02-27 ENCOUNTER — Ambulatory Visit (INDEPENDENT_AMBULATORY_CARE_PROVIDER_SITE_OTHER): Payer: Medicare PPO

## 2021-02-27 ENCOUNTER — Other Ambulatory Visit: Payer: Self-pay

## 2021-02-27 DIAGNOSIS — E538 Deficiency of other specified B group vitamins: Secondary | ICD-10-CM | POA: Diagnosis not present

## 2021-02-27 MED ORDER — CYANOCOBALAMIN 1000 MCG/ML IJ SOLN
1000.0000 ug | Freq: Once | INTRAMUSCULAR | Status: AC
  Administered 2021-02-27: 1000 ug via INTRAMUSCULAR

## 2021-02-27 NOTE — Progress Notes (Signed)
Patient presented for B 12 injection to left deltoid, patient voiced no concerns nor showed any signs of distress during injection. 

## 2021-03-09 ENCOUNTER — Other Ambulatory Visit: Payer: Self-pay | Admitting: Internal Medicine

## 2021-03-21 DIAGNOSIS — Z961 Presence of intraocular lens: Secondary | ICD-10-CM | POA: Diagnosis not present

## 2021-04-01 ENCOUNTER — Ambulatory Visit (INDEPENDENT_AMBULATORY_CARE_PROVIDER_SITE_OTHER): Payer: Medicare PPO

## 2021-04-01 ENCOUNTER — Other Ambulatory Visit: Payer: Self-pay

## 2021-04-01 DIAGNOSIS — E538 Deficiency of other specified B group vitamins: Secondary | ICD-10-CM

## 2021-04-01 MED ORDER — CYANOCOBALAMIN 1000 MCG/ML IJ SOLN
1000.0000 ug | Freq: Once | INTRAMUSCULAR | Status: AC
  Administered 2021-04-01: 1000 ug via INTRAMUSCULAR

## 2021-04-01 NOTE — Progress Notes (Signed)
Patient presented for B 12 injection to left deltoid, patient voiced no concerns nor showed any signs of distress during injection. 

## 2021-04-14 ENCOUNTER — Other Ambulatory Visit: Payer: Self-pay | Admitting: Internal Medicine

## 2021-04-14 DIAGNOSIS — E785 Hyperlipidemia, unspecified: Secondary | ICD-10-CM

## 2021-04-15 DIAGNOSIS — M7712 Lateral epicondylitis, left elbow: Secondary | ICD-10-CM | POA: Diagnosis not present

## 2021-04-15 DIAGNOSIS — M19049 Primary osteoarthritis, unspecified hand: Secondary | ICD-10-CM | POA: Diagnosis not present

## 2021-05-02 ENCOUNTER — Ambulatory Visit (INDEPENDENT_AMBULATORY_CARE_PROVIDER_SITE_OTHER): Payer: Medicare PPO

## 2021-05-02 ENCOUNTER — Other Ambulatory Visit: Payer: Self-pay

## 2021-05-02 DIAGNOSIS — E538 Deficiency of other specified B group vitamins: Secondary | ICD-10-CM | POA: Diagnosis not present

## 2021-05-02 MED ORDER — CYANOCOBALAMIN 1000 MCG/ML IJ SOLN
1000.0000 ug | Freq: Once | INTRAMUSCULAR | Status: AC
  Administered 2021-05-02: 1000 ug via INTRAMUSCULAR

## 2021-05-02 NOTE — Progress Notes (Signed)
Patient presented for B 12 injection to left deltoid, patient voiced no concerns nor showed any signs of distress during injection. 

## 2021-05-27 ENCOUNTER — Other Ambulatory Visit: Payer: Self-pay | Admitting: *Deleted

## 2021-05-27 DIAGNOSIS — R911 Solitary pulmonary nodule: Secondary | ICD-10-CM

## 2021-05-27 NOTE — Progress Notes (Signed)
Referral to pulmonary nodule clinic previously received from Dr. Derrel Nip. Program transitioned to [place]. New referral placed for pt to be scheduled in the their clinic. Last CT scan 10/10/20 with recommendations for follow up imaging in 6-12 months.

## 2021-06-03 ENCOUNTER — Ambulatory Visit (INDEPENDENT_AMBULATORY_CARE_PROVIDER_SITE_OTHER): Payer: Medicare PPO

## 2021-06-03 ENCOUNTER — Other Ambulatory Visit: Payer: Self-pay

## 2021-06-03 DIAGNOSIS — E538 Deficiency of other specified B group vitamins: Secondary | ICD-10-CM

## 2021-06-03 MED ORDER — CYANOCOBALAMIN 1000 MCG/ML IJ SOLN
1000.0000 ug | Freq: Once | INTRAMUSCULAR | Status: AC
  Administered 2021-06-03: 1000 ug via INTRAMUSCULAR

## 2021-06-03 NOTE — Progress Notes (Signed)
Patient came in today for B-12 injection in right deltoid IM. Patient tolerated well with no signs of distress. 

## 2021-06-16 ENCOUNTER — Ambulatory Visit (INDEPENDENT_AMBULATORY_CARE_PROVIDER_SITE_OTHER): Payer: Medicare PPO

## 2021-06-16 VITALS — BP 113/73 | HR 76 | Ht 64.0 in | Wt 165.0 lb

## 2021-06-16 DIAGNOSIS — Z0000 Encounter for general adult medical examination without abnormal findings: Secondary | ICD-10-CM | POA: Diagnosis not present

## 2021-06-16 NOTE — Patient Instructions (Addendum)
Shelley Zuniga , Thank you for taking time to come for your Medicare Wellness Visit. I appreciate your ongoing commitment to your health goals. Please review the following plan we discussed and let me know if I can assist you in the future.   These are the goals we discussed:  Goals       Patient Stated     Healthy Lifestyle (pt-stated)      Eat a healthy diet. Stay active.        This is a list of the screening recommended for you and due dates:  Health Maintenance  Topic Date Due   COVID-19 Vaccine (5 - Booster for Pfizer series) 07/02/2021*   Zoster (Shingles) Vaccine (1 of 2) 09/14/2021*   Cologuard (Stool DNA test)  02/25/2022*   Mammogram  02/14/2022   Tetanus Vaccine  12/07/2028   Pneumonia Vaccine  Completed   Flu Shot  Completed   DEXA scan (bone density measurement)  Completed   Hepatitis C Screening: USPSTF Recommendation to screen - Ages 27-79 yo.  Completed   HPV Vaccine  Aged Out  *Topic was postponed. The date shown is not the original due date.    Advanced directives: not yet completed  Conditions/risks identified: none new  Follow up in one year for your annual wellness visit    Preventive Care 65 Years and Older, Female Preventive care refers to lifestyle choices and visits with your health care provider that can promote health and wellness. What does preventive care include? A yearly physical exam. This is also called an annual well check. Dental exams once or twice a year. Routine eye exams. Ask your health care provider how often you should have your eyes checked. Personal lifestyle choices, including: Daily care of your teeth and gums. Regular physical activity. Eating a healthy diet. Avoiding tobacco and drug use. Limiting alcohol use. Practicing safe sex. Taking low-dose aspirin every day. Taking vitamin and mineral supplements as recommended by your health care provider. What happens during an annual well check? The services and screenings  done by your health care provider during your annual well check will depend on your age, overall health, lifestyle risk factors, and family history of disease. Counseling  Your health care provider may ask you questions about your: Alcohol use. Tobacco use. Drug use. Emotional well-being. Home and relationship well-being. Sexual activity. Eating habits. History of falls. Memory and ability to understand (cognition). Work and work Astronomer. Reproductive health. Screening  You may have the following tests or measurements: Height, weight, and BMI. Blood pressure. Lipid and cholesterol levels. These may be checked every 5 years, or more frequently if you are over 31 years old. Skin check. Lung cancer screening. You may have this screening every year starting at age 50 if you have a 30-pack-year history of smoking and currently smoke or have quit within the past 15 years. Fecal occult blood test (FOBT) of the stool. You may have this test every year starting at age 109. Flexible sigmoidoscopy or colonoscopy. You may have a sigmoidoscopy every 5 years or a colonoscopy every 10 years starting at age 42. Hepatitis C blood test. Hepatitis B blood test. Sexually transmitted disease (STD) testing. Diabetes screening. This is done by checking your blood sugar (glucose) after you have not eaten for a while (fasting). You may have this done every 1-3 years. Bone density scan. This is done to screen for osteoporosis. You may have this done starting at age 45. Mammogram. This may be done every  1-2 years. Talk to your health care provider about how often you should have regular mammograms. Talk with your health care provider about your test results, treatment options, and if necessary, the need for more tests. Vaccines  Your health care provider may recommend certain vaccines, such as: Influenza vaccine. This is recommended every year. Tetanus, diphtheria, and acellular pertussis (Tdap, Td)  vaccine. You may need a Td booster every 10 years. Zoster vaccine. You may need this after age 62. Pneumococcal 13-valent conjugate (PCV13) vaccine. One dose is recommended after age 26. Pneumococcal polysaccharide (PPSV23) vaccine. One dose is recommended after age 76. Talk to your health care provider about which screenings and vaccines you need and how often you need them. This information is not intended to replace advice given to you by your health care provider. Make sure you discuss any questions you have with your health care provider. Document Released: 05/31/2015 Document Revised: 01/22/2016 Document Reviewed: 03/05/2015 Elsevier Interactive Patient Education  2017 [employer].  Fall Prevention in the Home Falls can cause injuries. They can happen to people of all ages. There are many things you can do to make your home safe and to help prevent falls. What can I do on the outside of my home? Regularly fix the edges of walkways and driveways and fix any cracks. Remove anything that might make you trip as you walk through a door, such as a raised step or threshold. Trim any bushes or trees on the path to your home. Use bright outdoor lighting. Clear any walking paths of anything that might make someone trip, such as rocks or tools. Regularly check to see if handrails are loose or broken. Make sure that both sides of any steps have handrails. Any raised decks and porches should have guardrails on the edges. Have any leaves, snow, or ice cleared regularly. Use sand or salt on walking paths during winter. Clean up any spills in your garage right away. This includes oil or grease spills. What can I do in the bathroom? Use night lights. Install grab bars by the toilet and in the tub and shower. Do not use towel bars as grab bars. Use non-skid mats or decals in the tub or shower. If you need to sit down in the shower, use a plastic, non-slip stool. Keep the floor dry. Clean up any  water that spills on the floor as soon as it happens. Remove soap buildup in the tub or shower regularly. Attach bath mats securely with double-sided non-slip rug tape. Do not have throw rugs and other things on the floor that can make you trip. What can I do in the bedroom? Use night lights. Make sure that you have a light by your bed that is easy to reach. Do not use any sheets or blankets that are too big for your bed. They should not hang down onto the floor. Have a firm chair that has side arms. You can use this for support while you get dressed. Do not have throw rugs and other things on the floor that can make you trip. What can I do in the kitchen? Clean up any spills right away. Avoid walking on wet floors. Keep items that you use a lot in easy-to-reach places. If you need to reach something above you, use a strong step stool that has a grab bar. Keep electrical cords out of the way. Do not use floor polish or wax that makes floors slippery. If you must use wax, use non-skid  floor wax. Do not have throw rugs and other things on the floor that can make you trip. What can I do with my stairs? Do not leave any items on the stairs. Make sure that there are handrails on both sides of the stairs and use them. Fix handrails that are broken or loose. Make sure that handrails are as long as the stairways. Check any carpeting to make sure that it is firmly attached to the stairs. Fix any carpet that is loose or worn. Avoid having throw rugs at the top or bottom of the stairs. If you do have throw rugs, attach them to the floor with carpet tape. Make sure that you have a light switch at the top of the stairs and the bottom of the stairs. If you do not have them, ask someone to add them for you. What else can I do to help prevent falls? Wear shoes that: Do not have high heels. Have rubber bottoms. Are comfortable and fit you well. Are closed at the toe. Do not wear sandals. If you use a  stepladder: Make sure that it is fully opened. Do not climb a closed stepladder. Make sure that both sides of the stepladder are locked into place. Ask someone to hold it for you, if possible. Clearly mark and make sure that you can see: Any grab bars or handrails. First and last steps. Where the edge of each step is. Use tools that help you move around (mobility aids) if they are needed. These include: Canes. Walkers. Scooters. Crutches. Turn on the lights when you go into a dark area. Replace any light bulbs as soon as they burn out. Set up your furniture so you have a clear path. Avoid moving your furniture around. If any of your floors are uneven, fix them. If there are any pets around you, be aware of where they are. Review your medicines with your doctor. Some medicines can make you feel dizzy. This can increase your chance of falling. Ask your doctor what other things that you can do to help prevent falls. This information is not intended to replace advice given to you by your health care provider. Make sure you discuss any questions you have with your health care provider. Document Released: 02/28/2009 Document Revised: 10/10/2015 Document Reviewed: 06/08/2014 Elsevier Interactive Patient Education  2017 [employer].

## 2021-06-16 NOTE — Progress Notes (Addendum)
Subjective:   Shelley Zuniga is a 71 y.o. female who presents for Medicare Annual (Subsequent) preventive examination.  Review of Systems    No ROS.  Medicare Wellness Virtual Visit.  Visual/audio telehealth visit, UTA vital signs.   See social history for additional risk factors.   Cardiac Risk Factors include: advanced age (>46men, >76 women);hypertension     Objective:    Today's Vitals   06/16/21 1324  BP: 113/73  Pulse: 76  Weight: 165 lb (74.8 kg)  Height: 5\' 4"  (1.626 m)   Body mass index is 28.32 kg/m.  Advanced Directives 06/16/2021 10/10/2020 06/13/2020 07/25/2019 06/13/2019  Does Patient Have a Medical Advance Directive? No No No No No  Would patient like information on creating a medical advance directive? No - Patient declined No - Patient declined No - Patient declined No - Patient declined No - Patient declined    Current Medications (verified) Outpatient Encounter Medications as of 06/16/2021  Medication Sig   ALPRAZolam (XANAX) 0.25 MG tablet Take 1 tablet (0.25 mg total) by mouth at bedtime as needed for anxiety.   amLODipine (NORVASC) 5 MG tablet TAKE 1 TABLET(5 MG) BY MOUTH DAILY   aspirin 81 MG tablet Take 81 mg by mouth daily.   atorvastatin (LIPITOR) 20 MG tablet TAKE 1 TABLET(20 MG) BY MOUTH DAILY   buPROPion (WELLBUTRIN SR) 100 MG 12 hr tablet TAKE 1 TABLET BY MOUTH TWICE DAILY   Cranberry 500 MG CAPS    cyclobenzaprine (FLEXERIL) 5 MG tablet Take 1 tablet (5 mg total) by mouth 3 (three) times daily as needed for muscle spasms (will cause drowsiness.). (Patient not taking: No sig reported)   Docusate Calcium (STOOL SOFTENER PO) Take by mouth.   estradiol (ESTRACE) 1 MG tablet Take 1 mg by mouth daily.   fluocinonide cream (LIDEX) 0.05 % Apply 1 application topically as needed.   fluticasone (FLONASE) 50 MCG/ACT nasal spray SPRAY 2 SPRAYS IN EACH NOSTRIL EVERY DAY   hydrocortisone cream 0.5 % Apply 1 application topically 2 (two) times daily as needed  for itching.   losartan-hydrochlorothiazide (HYZAAR) 100-25 MG tablet TAKE 1 TABLET BY MOUTH DAILY   PREVIDENT 5000 DRY MOUTH 1.1 % GEL dental gel    VITAMIN D PO Take 400 mg by mouth daily.   vitamin E 400 UNIT capsule Take 400 Units by mouth daily.   [DISCONTINUED] doxycycline (VIBRA-TABS) 100 MG tablet Take 1 tablet (100 mg total) by mouth 2 (two) times daily. (Patient not taking: Reported on 10/21/2020)   Facility-Administered Encounter Medications as of 06/16/2021  Medication   methylPREDNISolone acetate (DEPO-MEDROL) injection 40 mg    Allergies (verified) Morphine and related and Sulfa antibiotics   History: Past Medical History:  Diagnosis Date   B12 neuropathy (HCC) 10/20/2013   Colon polyp 2001   colonoscopy, [facility]    Headache    regularly, not migraines   Heart murmur    Hypertension    Motion sickness    PONV (postoperative nausea and vomiting)    Strabismus age 58   eye surgery   Wears hearing aid in both ears    Past Surgical History:  Procedure Laterality Date   ABDOMINAL HYSTERECTOMY  2000   [place], [facility], fibroids/menorrhagia   BASAL CELL CARCINOMA EXCISION Left 2013   Dr. 2014   BILATERAL OOPHORECTOMY  2000   fibroid cysts   CATARACT EXTRACTION W/PHACO Left 07/11/2019   Procedure: CATARACT EXTRACTION PHACO AND INTRAOCULAR LENS PLACEMENT (IOC) LEFT 8.41 00:49.8 ;  Surgeon: Galen ManilaPorfilio, William, MD;  [facility];  Service: Ophthalmology;  Laterality: Left;   CATARACT EXTRACTION W/PHACO Right 07/25/2019   Procedure: CATARACT EXTRACTION PHACO AND INTRAOCULAR LENS PLACEMENT (IOC) RIGHT TORIC LENS;  Surgeon: Galen ManilaPorfilio, William, MD;  [facility];  Service: Ophthalmology;  Laterality: Right;  CDE 4.84 U/S 0:35.7   EYE SURGERY  1956   to correct strabismus   GALLBLADDER SURGERY     HEMORRHOID SURGERY     Family History  Problem Relation Age of Onset   Cancer Mother        melanoma, squamous cell on nose   Hypertension Mother     COPD Mother    Hyperlipidemia Mother    Cancer Father        lung, tobacco user   Cancer Brother        throat, tobacco user   Cancer Maternal Grandmother        uterine/ovarian   Social History   Socioeconomic History   Marital status: Married    Spouse name: Not on file   Number of children: Not on file   Years of education: Not on file   Highest education level: Not on file  Occupational History   Not on file  Tobacco Use   Smoking status: Never   Smokeless tobacco: Never  Vaping Use   Vaping Use: Never used  Substance and Sexual Activity   Alcohol use: No   Drug use: No   Sexual activity: Yes    Birth control/protection: Post-menopausal  Other Topics Concern   Not on file  Social History Narrative   Not on file   Social Determinants of Health   Financial Resource Strain: Low Risk    Difficulty of Paying Living Expenses: Not hard at all  Food Insecurity: No Food Insecurity   Worried About Programme researcher, broadcasting/film/videounning Out of Food in the Last Year: Never true   Ran Out of Food in the Last Year: Never true  Transportation Needs: No Transportation Needs   Lack of Transportation (Medical): No   Lack of Transportation (Non-Medical): No  Physical Activity: Not on file  Stress: No Stress Concern Present   Feeling of Stress : Not at all  Social Connections: Unknown   Frequency of Communication with Friends and Family: Not on file   Frequency of Social Gatherings with Friends and Family: Not on file   Attends Religious Services: Not on file   Active Member of Clubs or Organizations: Not on file   Attends BankerClub or Organization Meetings: Not on file   Marital Status: Married    Tobacco Counseling Counseling given: Not Answered   Clinical Intake:  Pre-visit preparation completed: Yes        Diabetes: No  How often do you need to have someone help you when you read instructions, pamphlets, or other written materials from your doctor or pharmacy?: 1 - Never   Interpreter  Needed?: No    Activities of Daily Living In your present state of health, do you have any difficulty performing the following activities: 06/16/2021  Hearing? Y  Comment Hearing aids  Vision? N  Difficulty concentrating or making decisions? N  Walking or climbing stairs? N  Dressing or bathing? N  Doing errands, shopping? N  Preparing Food and eating ? N  Using the Toilet? N  In the past six months, have you accidently leaked urine? Y  Comment Managed with daily liner/pad. [facility].  Do you have problems with  loss of bowel control? N  Managing your Medications? N  Managing your Finances? N  Housekeeping or managing your Housekeeping? N  Some recent data might be hidden   Patient Care Team: Sherlene Shamsullo, Teresa L, MD as PCP - General (Internal Medicine)  Indicate any recent Medical Services you may have received from other than [facility] providers in the past year (date may be approximate).     Assessment:   This is a routine wellness examination for Clydie BraunKaren.  Virtual Visit via Telephone Note  I connected with  Diego CoryKaren K Fernando on 06/16/21 at  1:15 PM EST by telephone and verified that I am speaking with the correct person using two identifiers.  Persons participating in the virtual visit: patient/Nurse Health Advisor   I discussed the limitations, risks, security and privacy concerns of performing an evaluation and management service by telephone and the availability of in person appointments. The patient expressed understanding and agreed to proceed.  Interactive audio and video telecommunications were attempted between this nurse and patient, however failed, due to patient having technical difficulties OR patient did not have access to video capability.  We continued and completed visit with audio only.  Some vital signs may be absent or patient reported.   Hearing/Vision screen Hearing Screening - Comments:: Hearing aid, crossover Vision Screening - Comments:: Wears  corrective lenses  Cataract extraction, bilateral   Dietary issues and exercise activities discussed: Current Exercise Habits: Home exercise routine, Type of exercise: walking, Time (Minutes): 20, Frequency (Times/Week): 4, Weekly Exercise (Minutes/Week): 80, Intensity: Mild She tries to follow a low carb diet Good water intake   Goals Addressed               This Visit's Progress     Patient Stated     Healthy Lifestyle (pt-stated)        Eat a healthy diet. Stay active.       Depression Screen PHQ 2/9 Scores 06/16/2021 10/21/2020 09/16/2020 06/13/2020 10/12/2019 06/13/2019 05/30/2018  PHQ - 2 Score 0 0 1 0 0 0 2  PHQ- 9 Score - - 4 - - - 9    Fall Risk Fall Risk  06/16/2021 10/21/2020 10/07/2020 09/16/2020 06/13/2020  Falls in the past year? 0 0 0 0 0  Number falls in past yr: 0 0 - 0 0  Injury with Fall? - 0 - 0 -  Follow up Falls evaluation completed Falls evaluation completed Falls evaluation completed Falls evaluation completed Falls evaluation completed   FALL RISK PREVENTION PERTAINING TO THE HOME: Home free of loose throw rugs in walkways, pet beds, electrical cords, etc? Yes  Adequate lighting in your home to reduce risk of falls? Yes   ASSISTIVE DEVICES UTILIZED TO PREVENT FALLS: Life alert? No  Use of a cane, walker or w/c? No   TIMED UP AND GO: Was the test performed? No .   Cognitive Function:  Patient is alert and oriented x3.  Enjoys brain stimulating games and activities.   6CIT Screen 06/13/2019  What Year? 0 points  What month? 0 points  What time? 0 points  Count back from 20 0 points  Months in reverse 0 points    Immunizations Immunization History  Administered Date(s) Administered   Fluad Quad(high Dose 65+) 03/19/2020   Influenza Split 03/26/2012   Influenza, High Dose Seasonal PF 02/22/2019   Influenza,inj,Quad PF,6+ Mos 02/22/2013, 02/10/2014   Influenza-Unspecified 04/04/2016, 03/13/2017, 01/31/2018   PFIZER(Purple Top)SARS-COV-2  Vaccination 05/25/2019, 06/15/2019, 10/05/2019, 02/12/2020   Pneumococcal  Conjugate-13 05/15/2016   Pneumococcal Polysaccharide-23 03/14/2020   Td 12/08/2018   Tdap 01/20/2010   Zoster, Live 10/19/2013   Shingrix Completed?: No.    Education has been provided regarding the importance of this vaccine. Patient has been advised to call insurance company to determine out of pocket expense if they have not yet received this vaccine. Advised may also receive vaccine at local pharmacy or Health Dept. Verbalized acceptance and understanding.  Screening Tests Health Maintenance  Topic Date Due   COVID-19 Vaccine (5 - Booster for Pfizer series) 07/02/2021 (Originally 04/08/2020)   Zoster Vaccines- Shingrix (1 of 2) 09/14/2021 (Originally 01/27/1970)   Fecal DNA (Cologuard)  02/25/2022 (Originally 11/17/2019)   MAMMOGRAM  02/14/2022   TETANUS/TDAP  12/07/2028   Pneumonia Vaccine 56+ Years old  Completed   INFLUENZA VACCINE  Completed   DEXA SCAN  Completed   Hepatitis C Screening  Completed   HPV VACCINES  Aged Out   Health Maintenance There are no preventive care reminders to display for this patient.  Lung Cancer Screening: (Low Dose CT Chest recommended if Age 33-80 years, 30 pack-year currently smoking OR have quit w/in 15years.) does not qualify.   Vision Screening: Recommended annual ophthalmology exams for early detection of glaucoma and other disorders of the eye.  Dental Screening: Recommended annual dental exams for proper oral hygiene  Community Resource Referral / Chronic Care Management: CRR required this visit?  No   CCM required this visit?  No      Plan:   Keep all routine maintenance appointments.   I have personally reviewed and noted the following in the patients chart:   Medical and social history Use of alcohol, tobacco or illicit drugs  Current medications and supplements including opioid prescriptions. Not taking opioid.  Functional ability and  status Nutritional status Physical activity Advanced directives List of other physicians Hospitalizations, surgeries, and ER visits in previous 12 months Vitals Screenings to include cognitive, depression, and falls Referrals and appointments  In addition, I have reviewed and discussed with patient certain preventive protocols, quality metrics, and best practice recommendations. A written personalized care plan for preventive services as well as general preventive health recommendations were provided to patient.     OBrien-Blaney, Braylon Grenda L, LPN   6/97/9480    I have reviewed the above information and agree with above.   Duncan Dull, MD

## 2021-06-27 ENCOUNTER — Ambulatory Visit (INDEPENDENT_AMBULATORY_CARE_PROVIDER_SITE_OTHER): Payer: Medicare PPO

## 2021-06-27 ENCOUNTER — Ambulatory Visit (INDEPENDENT_AMBULATORY_CARE_PROVIDER_SITE_OTHER): Payer: Medicare PPO | Admitting: Internal Medicine

## 2021-06-27 ENCOUNTER — Other Ambulatory Visit: Payer: Self-pay

## 2021-06-27 ENCOUNTER — Encounter: Payer: Self-pay | Admitting: Internal Medicine

## 2021-06-27 VITALS — BP 120/88 | HR 82 | Temp 97.8°F | Ht 64.0 in | Wt 164.0 lb

## 2021-06-27 DIAGNOSIS — R0609 Other forms of dyspnea: Secondary | ICD-10-CM | POA: Diagnosis not present

## 2021-06-27 DIAGNOSIS — R918 Other nonspecific abnormal finding of lung field: Secondary | ICD-10-CM | POA: Diagnosis not present

## 2021-06-27 DIAGNOSIS — I10 Essential (primary) hypertension: Secondary | ICD-10-CM | POA: Diagnosis not present

## 2021-06-27 DIAGNOSIS — J45909 Unspecified asthma, uncomplicated: Secondary | ICD-10-CM | POA: Diagnosis not present

## 2021-06-27 DIAGNOSIS — Z79899 Other long term (current) drug therapy: Secondary | ICD-10-CM | POA: Diagnosis not present

## 2021-06-27 DIAGNOSIS — R7301 Impaired fasting glucose: Secondary | ICD-10-CM | POA: Diagnosis not present

## 2021-06-27 DIAGNOSIS — I700 Atherosclerosis of aorta: Secondary | ICD-10-CM | POA: Diagnosis not present

## 2021-06-27 DIAGNOSIS — E782 Mixed hyperlipidemia: Secondary | ICD-10-CM

## 2021-06-27 NOTE — Assessment & Plan Note (Signed)
Reviewed findings of prior CT scan today with patient.  She is tolerating atorvastatin 20 mg daily

## 2021-06-27 NOTE — Progress Notes (Signed)
Subjective:  Patient ID: Shelley Zuniga, female    DOB: 11/03/1950  Age: 71 y.o. MRN: [id number]  CC: The primary encounter diagnosis was Primary hypertension. Diagnoses of Impaired fasting glucose, Mixed hyperlipidemia, Long-term use of high-risk medication, Exertional dyspnea, Pulmonary nodules, and Abdominal aortic atherosclerosis (HCC) were also pertinent to this visit.   This visit occurred during the SARS-CoV-2 public health emergency.  Safety protocols were in place, including screening questions prior to the visit, additional usage of staff PPE, and extensive cleaning of exam room while observing appropriate contact time as indicated for disinfecting solutions.    HPI Shelley Zuniga presents for  Chief Complaint  Patient presents with   Follow-up    26mo f/u for HTN and refill for Xanax   1) Aortic atherosclerosis :  Patient is tolerating high potency statin therapy    2) HTN:  Patient is taking her medications as prescribed and notes no adverse effects.  Home BP readings have been done about once per week and are  generally < 130/80 .  She is avoiding added salt in her diet and walking  daily for exercise  .   3) new onset dyspnea with exertion:  occurs  or vacuuming for prolonged periods .  Started several months after recovering from  COVID infection last year.  She has a history of asthma diagnosed a a child by a country doctor, with no former PFTs.  The dyspnea is mild and relieved with albuterol MDI .  Denies cough and wheezing  associated with the dyspnea.   She denies  CHEST PAIN,,  NO orthopnea or fluid retention .   4) Pulmnary nodules  found on Chest CT  May   2022 CT omnia using alprazolam less than once a week     Outpatient Medications Prior to Visit  Medication Sig Dispense Refill   ALPRAZolam (XANAX) 0.25 MG tablet Take 1 tablet (0.25 mg total) by mouth at bedtime as needed for anxiety. 30 tablet 5   amLODipine (NORVASC) 5 MG tablet TAKE 1 TABLET(5 MG) BY MOUTH  DAILY 90 tablet 3   aspirin 81 MG tablet Take 81 mg by mouth daily.     atorvastatin (LIPITOR) 20 MG tablet TAKE 1 TABLET(20 MG) BY MOUTH DAILY 90 tablet 3   buPROPion (WELLBUTRIN SR) 100 MG 12 hr tablet TAKE 1 TABLET BY MOUTH TWICE DAILY 180 tablet 3   Cranberry 500 MG CAPS      Docusate Calcium (STOOL SOFTENER PO) Take by mouth.     estradiol (ESTRACE) 1 MG tablet Take 1 mg by mouth daily.     fluticasone (FLONASE) 50 MCG/ACT nasal spray SPRAY 2 SPRAYS IN EACH NOSTRIL EVERY DAY 48 g 0   hydrocortisone cream 0.5 % Apply 1 application topically 2 (two) times daily as needed for itching.     losartan-hydrochlorothiazide (HYZAAR) 100-25 MG tablet TAKE 1 TABLET BY MOUTH DAILY 90 tablet 3   PREVIDENT 5000 DRY MOUTH 1.1 % GEL dental gel      VITAMIN D PO Take 400 mg by mouth daily.     vitamin E 400 UNIT capsule Take 400 Units by mouth daily.     cyanocobalamin (,VITAMIN B-12,) 1000 MCG/ML injection Inject into the muscle.     cyclobenzaprine (FLEXERIL) 5 MG tablet Take 1 tablet (5 mg total) by mouth 3 (three) times daily as needed for muscle spasms (will cause drowsiness.). (Patient not taking: Reported on 10/07/2020) 30 tablet 1   fluocinonide cream (  LIDEX) 5.03 % Apply 1 application topically as needed. (Patient not taking: Reported on 06/27/2021)     Facility-Administered Medications Prior to Visit  Medication Dose Route Frequency Provider Last Rate Last Admin   methylPREDNISolone acetate (DEPO-MEDROL) injection 40 mg  40 mg Intramuscular Once Crecencio Mc, MD        Review of Systems;  Patient denies headache, fevers, malaise, unintentional weight loss, skin rash, eye pain, sinus congestion and sinus pain, sore throat, dysphagia,  hemoptysis , cough, dyspnea, wheezing, chest pain, palpitations, orthopnea, edema, abdominal pain, nausea, melena, diarrhea, constipation, flank pain, dysuria, hematuria, urinary  Frequency, nocturia, numbness, tingling, seizures,  Focal weakness, Loss of  consciousness,  Tremor, insomnia, depression, anxiety, and suicidal ideation.      Objective:  BP 120/88 (BP Location: Left Arm, Patient Position: Sitting, Cuff Size: Small)    Pulse 82    Temp 97.8 F (36.6 C) (Oral)    Ht $R'5\' 4"'IO$  (1.626 m)    Wt 164 lb (74.4 kg)    SpO2 97%    BMI 28.15 kg/m   BP Readings from Last 3 Encounters:  06/27/21 120/88  06/16/21 113/73  10/21/20 134/86    Wt Readings from Last 3 Encounters:  06/27/21 164 lb (74.4 kg)  06/16/21 165 lb (74.8 kg)  10/21/20 165 lb (74.8 kg)    General appearance: alert, cooperative and appears stated age Ears: normal TM's and external ear canals both ears Throat: lips, mucosa, and tongue normal; teeth and gums normal Neck: no adenopathy, no carotid bruit, supple, symmetrical, trachea midline and thyroid not enlarged, symmetric, no tenderness/mass/nodules Back: symmetric, no curvature. ROM normal. No CVA tenderness. Lungs: clear to auscultation bilaterally Heart: regular rate and rhythm, S1, S2 normal, no murmur, click, rub or gallop Abdomen: soft, non-tender; bowel sounds normal; no masses,  no organomegaly Pulses: 2+ and symmetric Skin: Skin color, texture, turgor normal. No rashes or lesions Lymph nodes: Cervical, supraclavicular, and axillary nodes normal.  Lab Results  Component Value Date   HGBA1C 6.1 03/14/2020   HGBA1C 6.0 07/17/2019   HGBA1C 6.0 01/24/2019    Lab Results  Component Value Date   CREATININE 0.77 10/10/2020   CREATININE 0.81 09/16/2020   CREATININE 0.82 03/14/2020    Lab Results  Component Value Date   WBC 5.7 10/10/2020   HGB 13.9 10/10/2020   HCT 39.2 10/10/2020   PLT 237 10/10/2020   GLUCOSE 146 (H) 10/10/2020   CHOL 154 03/14/2020   TRIG 146.0 03/14/2020   HDL 63.60 03/14/2020   LDLDIRECT 72.0 05/15/2016   LDLCALC 61 03/14/2020   ALT 15 10/10/2020   AST 18 10/10/2020   NA 139 10/10/2020   K 3.6 10/10/2020   CL 104 10/10/2020   CREATININE 0.77 10/10/2020   BUN 14  10/10/2020   CO2 27 10/10/2020   TSH 0.91 05/30/2018   HGBA1C 6.1 03/14/2020   MICROALBUR <0.7 07/17/2019    MR ABDOMEN MRCP W WO CONTAST  Result Date: 11/04/2020 CLINICAL DATA:  Intermittent right upper quadrant pain with biliary ductal dilation on prior CT EXAM: MRI ABDOMEN WITHOUT AND WITH CONTRAST (INCLUDING MRCP) TECHNIQUE: Multiplanar multisequence MR imaging of the abdomen was performed both before and after the administration of intravenous contrast. Heavily T2-weighted images of the biliary and pancreatic ducts were obtained, and three-dimensional MRCP images were rendered by post processing. CONTRAST:  19mL GADAVIST GADOBUTROL 1 MMOL/ML IV SOLN COMPARISON:  CT Oct 10, 2020 FINDINGS: Lower chest: No acute abnormality. Hepatobiliary: No hepatic steatosis.  No suspicious hepatic lesion. Gallbladder is surgically absent. Dilation of the intra and extra hepatic bile ducts to the level of the ampulla with the common duct measuring up to 1.2 cm on image 11/13. No peribiliary enhancement. No intraluminal filling defect visualized. No masslike lesions producing extrinsic compression visualized. Pancreas: Normal intrinsic T1 signal throughout the pancreatic parenchyma. No cystic or arterially enhancing pancreatic lesions visualized. No pancreatic ductal dilation. No pancreatic divisum. Spleen:  Within normal limits. Adrenals/Urinary Tract: Bilateral adrenal glands are unremarkable. No hydronephrosis. Left kidney is grossly unremarkable. There are multiple T2 hyperintense right renal lesions measuring up to 5.4 cm in the upper pole on image 19/4. Some lesions demonstrate a few thin internal septations for instance in the right lower pole measuring 18 cm on image 26/4. No suspicious wall thickening, thick internal septations or enhancing nodularity associated with these cystic lesions visualized, consistent with Bosniak classification 1 and 2 renal cysts. Stomach/Bowel: Visualized portions within the abdomen  are unremarkable. Vascular/Lymphatic: No pathologically enlarged lymph nodes identified. No abdominal aortic aneurysm demonstrated. Other:  No abdominal ascites. Musculoskeletal: No suspicious bone lesions identified. IMPRESSION: 1. Dilation of the intra and extra hepatic bile ducts to the level of the ampulla with the common duct measuring up to 1.2 cm, no discrete obstructive etiology visualized. Findings likely reflecting reservoir effect postcholecystectomy state. 2. Multiple benign Bosniak classification 1 and 2 cystic renal lesions. Electronically Signed   By: Dahlia Bailiff MD   On: 11/04/2020 10:43    Assessment & Plan:   Problem List Items Addressed This Visit     Abdominal aortic atherosclerosis [facility])    Reviewed findings of prior CT scan today with patient.  She is tolerating atorvastatin 20 mg daily       Exertional dyspnea    New onset,  Relieved with use of albuterol MDI.  Informal diagnosis of childhood asthma. Chest x ray today;  Will discuss with Dr Patsey Berthold whether PFTs should be done prior to her scheduled app in early March       Relevant Orders   DG Chest 2 View   Hypertension - Primary    Home readings have been < 120/80 and she would like to dc the hctz due to polyuria.  Suspension of hctz suggested .  If readings become elevated.  Will increase amlodipine dose.       Relevant Orders   Comp Met (CMET)   Urine Microalbumin w/creat. ratio   Impaired fasting glucose    Her  random glucose has never been  elevated but her A1c suggests she is at risk for developing diabetes. She has been following  A low glycemic index diet and particpating regularly in an aerobic  exercise activity.       Relevant Orders   HgB A1c   Urine Microalbumin w/creat. ratio   Mixed hyperlipidemia   Relevant Orders   Lipid Profile   Pulmonary nodules    Incidental findings on May 2022 CT ABD /Pelvis obtained during ER visit for abd pain.  She is a lifelong nonsmoker and had COVID  infection in Jan 2022.  Referral to pulmonary nodule clinic has been made and appt is with Dr Patsey Berthold in March.        Other Visit Diagnoses     Long-term use of high-risk medication       Relevant Orders   CBC with Differential/Platelet       I spent 30 minutes dedicated to the care of this patient on the  date of this encounter to include pre-visit review of patient's medical history,  most recent imaging studies, Face-to-face time with the patient , and post visit ordering of testing and therapeutics.    Follow-up: No follow-ups on file.   Crecencio Mc, MD

## 2021-06-27 NOTE — Patient Instructions (Addendum)
You can try taking losartan without hctz for one week and follow BP readings  If your readings are still 130/80 or lower,  I will dc the hctz.  If they are higher,  we'll need to increase the amlodipine to 10 mg    I'll speak to Dr Patsey Berthold about getting PFTs  prior to your appt with her

## 2021-06-27 NOTE — Assessment & Plan Note (Signed)
Incidental findings on May 2022 CT ABD /Pelvis obtained during ER visit for abd pain.  She is a lifelong nonsmoker and had COVID infection in Jan 2022.  Referral to pulmonary nodule clinic has been made and appt is with Dr Jayme Cloud in March.

## 2021-06-27 NOTE — Assessment & Plan Note (Signed)
New onset,  Relieved with use of albuterol MDI.  Informal diagnosis of childhood asthma. Chest x ray today;  Will discuss with Dr Jayme Cloud whether PFTs should be done prior to her scheduled app in early March

## 2021-06-28 ENCOUNTER — Encounter: Payer: Self-pay | Admitting: Internal Medicine

## 2021-06-28 NOTE — Assessment & Plan Note (Signed)
Home readings have been < 120/80 and she would like to dc the hctz due to polyuria.  Suspension of hctz suggested .  If readings become elevated.  Will increase amlodipine dose.

## 2021-06-28 NOTE — Assessment & Plan Note (Signed)
Her  random glucose has never been  elevated but her A1c suggests she is at risk for developing diabetes. She has been following  A low glycemic index diet and particpating regularly in an aerobic  exercise activity.

## 2021-07-04 ENCOUNTER — Other Ambulatory Visit: Payer: Self-pay

## 2021-07-04 ENCOUNTER — Ambulatory Visit (INDEPENDENT_AMBULATORY_CARE_PROVIDER_SITE_OTHER): Payer: Medicare PPO | Admitting: *Deleted

## 2021-07-04 DIAGNOSIS — E538 Deficiency of other specified B group vitamins: Secondary | ICD-10-CM

## 2021-07-04 DIAGNOSIS — E782 Mixed hyperlipidemia: Secondary | ICD-10-CM | POA: Diagnosis not present

## 2021-07-04 DIAGNOSIS — I10 Essential (primary) hypertension: Secondary | ICD-10-CM

## 2021-07-04 DIAGNOSIS — R7301 Impaired fasting glucose: Secondary | ICD-10-CM

## 2021-07-04 DIAGNOSIS — Z79899 Other long term (current) drug therapy: Secondary | ICD-10-CM | POA: Diagnosis not present

## 2021-07-04 LAB — LIPID PANEL
Cholesterol: 161 mg/dL (ref 0–200)
HDL: 63.2 mg/dL (ref >39.00)
LDL Cholesterol: 72 mg/dL (ref 0–99)
NonHDL: 98.12
Total CHOL/HDL Ratio: 3
Triglycerides: 129 mg/dL (ref 0.0–149.0)
VLDL: 25.8 mg/dL (ref 0.0–40.0)

## 2021-07-04 LAB — CBC WITH DIFFERENTIAL/PLATELET
Basophils Absolute: 0 10*3/uL (ref 0.0–0.1)
Basophils Relative: 0.8 % (ref 0.0–3.0)
Eosinophils Absolute: 0.1 10*3/uL (ref 0.0–0.7)
Eosinophils Relative: 2.5 % (ref 0.0–5.0)
HCT: 38.7 % (ref 36.0–46.0)
Hemoglobin: 13.3 g/dL (ref 12.0–15.0)
Lymphocytes Relative: 28.1 % (ref 12.0–46.0)
Lymphs Abs: 1.4 10*3/uL (ref 0.7–4.0)
MCHC: 34.4 g/dL (ref 30.0–36.0)
MCV: 94.2 fl (ref 78.0–100.0)
Monocytes Absolute: 0.4 10*3/uL (ref 0.1–1.0)
Monocytes Relative: 7.9 % (ref 3.0–12.0)
Neutro Abs: 3.1 10*3/uL (ref 1.4–7.7)
Neutrophils Relative %: 60.7 % (ref 43.0–77.0)
Platelets: 268 10*3/uL (ref 150.0–400.0)
RBC: 4.11 Mil/uL (ref 3.87–5.11)
RDW: 12.4 % (ref 11.5–15.5)
WBC: 5.2 10*3/uL (ref 4.0–10.5)

## 2021-07-04 LAB — COMPREHENSIVE METABOLIC PANEL
ALT: 10 U/L (ref 0–35)
AST: 10 U/L (ref 0–37)
Albumin: 3.9 g/dL (ref 3.5–5.2)
Alkaline Phosphatase: 54 U/L (ref 39–117)
BUN: 16 mg/dL (ref 6–23)
CO2: 32 mEq/L (ref 19–32)
Calcium: 9 mg/dL (ref 8.4–10.5)
Chloride: 102 mEq/L (ref 96–112)
Creatinine, Ser: 0.75 mg/dL (ref 0.40–1.20)
GFR: 80.09 mL/min (ref >60.00)
Glucose, Bld: 121 mg/dL — ABNORMAL HIGH (ref 70–99)
Potassium: 4.1 mEq/L (ref 3.5–5.1)
Sodium: 137 mEq/L (ref 135–145)
Total Bilirubin: 0.6 mg/dL (ref 0.2–1.2)
Total Protein: 6.5 g/dL (ref 6.0–8.3)

## 2021-07-04 LAB — HEMOGLOBIN A1C: Hgb A1c MFr Bld: 6 % (ref 4.6–6.5)

## 2021-07-04 LAB — MICROALBUMIN / CREATININE URINE RATIO
Creatinine,U: 80.3 mg/dL
Microalb Creat Ratio: 0.9 mg/g (ref 0.0–30.0)
Microalb, Ur: <0.7 mg/dL (ref 0.0–1.9)

## 2021-07-04 MED ORDER — CYANOCOBALAMIN 1000 MCG/ML IJ SOLN
1000.0000 ug | Freq: Once | INTRAMUSCULAR | Status: AC
  Administered 2021-07-04: 1000 ug via INTRAMUSCULAR

## 2021-07-04 NOTE — Progress Notes (Signed)
Pt arrived for B12 injection, given in L deltoid. Pt tolerated injection well, showed no signs of distress and voiced no concerns.

## 2021-07-08 ENCOUNTER — Telehealth: Payer: Self-pay | Admitting: Internal Medicine

## 2021-07-08 NOTE — Telephone Encounter (Signed)
Patient called and wanted to know if Dr Darrick Huntsman  spoke to Dr Jayme Cloud about PFT's before coming to see Dr. Jayme Cloud. Dr Darrick Huntsman advised patient if she has not heard from her office to call.

## 2021-07-08 NOTE — Telephone Encounter (Signed)
Pt stated that she has not gotten a call about getting the PFT testing scheduled. Per order Dr. Darrick Huntsman would like for her to have this done prior to her Pulmonology appt on March 9th. Test was ordered on 06/28/2021.

## 2021-07-14 ENCOUNTER — Other Ambulatory Visit
Admission: RE | Admit: 2021-07-14 | Discharge: 2021-07-14 | Disposition: A | Discharge status: Home / Self Care | Payer: Medicare PPO | Source: Ambulatory Visit | Attending: Pulmonary Disease | Admitting: Pulmonary Disease

## 2021-07-14 ENCOUNTER — Other Ambulatory Visit: Payer: Self-pay

## 2021-07-14 DIAGNOSIS — Z01812 Encounter for preprocedural laboratory examination: Secondary | ICD-10-CM | POA: Diagnosis not present

## 2021-07-14 DIAGNOSIS — Z20822 Contact with and (suspected) exposure to covid-19: Secondary | ICD-10-CM | POA: Insufficient documentation

## 2021-07-14 LAB — SARS CORONAVIRUS 2 (TAT 6-24 HRS): SARS Coronavirus 2: NEGATIVE

## 2021-07-14 NOTE — Telephone Encounter (Signed)
noted 

## 2021-07-15 ENCOUNTER — Ambulatory Visit: Payer: Medicare PPO | Attending: Internal Medicine

## 2021-07-15 DIAGNOSIS — R0609 Other forms of dyspnea: Secondary | ICD-10-CM | POA: Diagnosis not present

## 2021-07-15 MED ORDER — ALBUTEROL SULFATE (2.5 MG/3ML) 0.083% IN NEBU
2.5000 mg | INHALATION_SOLUTION | Freq: Once | RESPIRATORY_TRACT | Status: AC
  Administered 2021-07-15: 2.5 mg via RESPIRATORY_TRACT
  Filled 2021-07-15: qty 3

## 2021-07-24 ENCOUNTER — Other Ambulatory Visit: Payer: Self-pay

## 2021-07-24 ENCOUNTER — Telehealth: Payer: Self-pay

## 2021-07-24 ENCOUNTER — Encounter: Payer: Self-pay | Admitting: Pulmonary Disease

## 2021-07-24 ENCOUNTER — Ambulatory Visit: Payer: Medicare PPO | Admitting: Pulmonary Disease

## 2021-07-24 VITALS — BP 116/68 | HR 74 | Temp 98.2°F | Ht 64.0 in | Wt 163.4 lb

## 2021-07-24 DIAGNOSIS — R911 Solitary pulmonary nodule: Secondary | ICD-10-CM

## 2021-07-24 DIAGNOSIS — J4530 Mild persistent asthma, uncomplicated: Secondary | ICD-10-CM | POA: Diagnosis not present

## 2021-07-24 MED ORDER — FLUTICASONE FUROATE-VILANTEROL 100-25 MCG/ACT IN AEPB: 1.0000 | INHALATION_SPRAY | Freq: Every day | RESPIRATORY_TRACT | 6 refills | Status: DC

## 2021-07-24 NOTE — Patient Instructions (Addendum)
You have a chest CT ordered to evaluate your lung nodule.  This is currently the size of a pencil eraser. ? ?We have sent in a prescription for an inhaler called Earlie Server which is for your asthma.  Make sure you rinse your mouth well after you use it. ? ?We will see you in follow-up in 3 months time call sooner should any new problems arise. ?

## 2021-07-24 NOTE — Progress Notes (Signed)
Subjective:    Patient ID: Shelley Zuniga, female    DOB: 1950/06/02, 71 y.o.   MRN: [id number]  Patient Care Team: Crecencio Mc, MD as PCP - General (Internal Medicine)  Chief Complaint  Patient presents with   Consult     HPI The patient is a 71 year old flow never smoker with history of asthma and lung nodules who is referred to continue follow-up on the issue of lung nodules.  She is referred by Beckey Rutter, NP.  She had been referred to the [facility] which has now transferred all of its activity to [facility].  CT scan was 10 Oct 2020 and it was a CT abdomen pelvis showing nodules in the lower lobes.  Recommendation was to follow-up imaging in 6 to 12 months.  The patient presents for scheduling of a dedicated chest CT.  Patient has been asymptomatic with regards to this finding.  No weight loss or anorexia, no hemoptysis, she has a dry cough on occasion related to her asthma.  She has difficulty with inhalers due to "not getting the hang" of the metered-dose inhalers.  She cannot do albuterol liability because of this.  Patient had COVID-19 in January 2023 however did not have to be hospitalized.  She received abortive care with albuterol MDI, prednisone taper, azithromycin and Tessalon Perles.  She responded well to these supportive measures.  With regards to her asthma, she notes that she does have distinct seasonal variation on symptoms.  Usually spring and fall of the year are worse.  She does note some exertional dyspnea with inclines and stairs of several years duration.  She will have occasionally a dry cough but no sputum production.  He has not had any chest pain, no fevers, chills or sweats.  No orthopnea or paroxysmal nocturnal dyspnea.  No lower extremity edema or calf tenderness.  Review of Systems A 10 point review of systems was performed and it is as noted above otherwise negative.  Past Medical History:  Diagnosis Date   B12  neuropathy ([place]) 10/20/2013   Colon polyp 2001   colonoscopy, [facility]    Headache    regularly, not migraines   Heart murmur    Hypertension    Motion sickness    PONV (postoperative nausea and vomiting)    Strabismus age 82   eye surgery   Tick bite of abdominal wall 10/22/2020   Wears hearing aid in both ears    Past Surgical History:  Procedure Laterality Date   ABDOMINAL HYSTERECTOMY  2000   [place], [facility], fibroids/menorrhagia   BASAL CELL CARCINOMA EXCISION Left 2013   Dr. Koleen Nimrod   BILATERAL OOPHORECTOMY  2000   fibroid cysts   CATARACT EXTRACTION W/PHACO Left 07/11/2019   Procedure: CATARACT EXTRACTION PHACO AND INTRAOCULAR LENS PLACEMENT (IOC) LEFT 8.41 00:49.8 ;  Surgeon: Birder Robson, MD;  Location: [facility];  Service: Ophthalmology;  Laterality: Left;   CATARACT EXTRACTION W/PHACO Right 07/25/2019   Procedure: CATARACT EXTRACTION PHACO AND INTRAOCULAR LENS PLACEMENT ([place]) RIGHT TORIC LENS;  Surgeon: Birder Robson, MD;  Location: [facility];  Service: Ophthalmology;  Laterality: Right;  CDE 4.84 U/S 0:35.7   EYE SURGERY  1956   to correct strabismus   GALLBLADDER SURGERY     HEMORRHOID SURGERY     Patient Active Problem List   Diagnosis Date Noted   Exertional dyspnea 06/27/2021   RUQ abdominal pain 10/22/2020   Pulmonary nodules 10/22/2020  Abdominal aortic atherosclerosis ([place]) 10/21/2020   Muscle spasm 09/16/2020   Personal history of COVID-19 06/11/2020   Osteoarthritis of thumb, left 12/18/2019   Polyneuropathy in other diseases classified elsewhere ([place]) 12/18/2019   Insomnia 01/07/2019   Sciatica of left side associated with disorder of lumbar spine 11/21/2016   Impaired fasting glucose 05/15/2016   Major depressive disorder, recurrent episode with melancholic features ([place]) [id number]   Shingles outbreak 10/01/2014   B12 deficiency 10/20/2013   Vitamin D deficiency 10/20/2013   Routine general medical examination at a health  care facility 09/19/2012   Screening for osteoporosis 04/14/2011   Mixed hyperlipidemia 04/14/2011   Screening for cervical cancer 04/13/2011   Screening for breast cancer 04/13/2011   Screening for colon cancer 04/13/2011   Hypertension 03/27/2011   Family History  Problem Relation Age of Onset   Cancer Mother        melanoma, squamous cell on nose   Hypertension Mother    COPD Mother    Hyperlipidemia Mother    Cancer Father        lung, tobacco user   Cancer Brother        throat, tobacco user   Cancer Maternal Grandmother        uterine/ovarian   Social History   Tobacco Use   Smoking status: Never   Smokeless tobacco: Never  Substance Use Topics   Alcohol use: No   Allergies  Allergen Reactions   Morphine And Related Itching   Sulfa Antibiotics Swelling    Tongue   Current Meds  Medication Sig   ALPRAZolam (XANAX) 0.25 MG tablet Take 1 tablet (0.25 mg total) by mouth at bedtime as needed for anxiety.   amLODipine (NORVASC) 5 MG tablet TAKE 1 TABLET(5 MG) BY MOUTH DAILY   aspirin 81 MG tablet Take 81 mg by mouth daily.   atorvastatin (LIPITOR) 20 MG tablet TAKE 1 TABLET(20 MG) BY MOUTH DAILY   buPROPion (WELLBUTRIN SR) 100 MG 12 hr tablet TAKE 1 TABLET BY MOUTH TWICE DAILY   Cranberry 500 MG CAPS    cyanocobalamin (,VITAMIN B-12,) 1000 MCG/ML injection Inject into the muscle.   Docusate Calcium (STOOL SOFTENER PO) Take by mouth.   estradiol (ESTRACE) 1 MG tablet Take 1 mg by mouth daily.   fluocinonide cream (LIDEX) [id number] % Apply 1 application. topically as needed.   fluticasone (FLONASE) 50 MCG/ACT nasal spray SPRAY 2 SPRAYS IN EACH NOSTRIL EVERY DAY   hydrocortisone cream 0.5 % Apply 1 application topically 2 (two) times daily as needed for itching.   losartan-hydrochlorothiazide (HYZAAR) 100-25 MG tablet TAKE 1 TABLET BY MOUTH DAILY   PREVIDENT 5000 DRY MOUTH 1.1 % GEL dental gel    VITAMIN D PO Take 400 mg by mouth daily.   vitamin E 400 UNIT capsule Take  400 Units by mouth daily.   [DISCONTINUED] cyclobenzaprine (FLEXERIL) 5 MG tablet Take 1 tablet (5 mg total) by mouth 3 (three) times daily as needed for muscle spasms (will cause drowsiness.).   Immunization History  Administered Date(s) Administered   Fluad Quad(high Dose 65+) 03/19/2020, 02/14/2021   Influenza Split 03/26/2012   Influenza, High Dose Seasonal PF 02/22/2019   Influenza,inj,Quad PF,6+ Mos 02/22/2013, 02/10/2014   Influenza-Unspecified 04/04/2016, 03/13/2017, 01/31/2018   PFIZER Comirnaty(Gray Top)Covid-19 Tri-Sucrose Vaccine 05/25/2019, 06/15/2019   PFIZER(Purple Top)SARS-COV-2 Vaccination 05/25/2019, 06/15/2019, 10/05/2019, 02/12/2020   Pneumococcal Conjugate-13 05/15/2016   Pneumococcal Polysaccharide-23 03/14/2020   Td 12/08/2018   Tdap 01/20/2010   Zoster, Live 10/19/2013  Objective:   Physical Exam BP 116/68 (BP Location: Left Arm, Patient Position: Sitting, Cuff Size: Normal)   Pulse 74   Temp 98.2 F (36.8 C) (Oral)   Ht '5\' 4"'$  (1.626 m)   Wt 163 lb 6.4 oz (74.1 kg)   SpO2 98%   BMI 28.05 kg/m  GENERAL: Overweight woman, no acute distress, fully ambulatory.  No conversational dyspnea. HEAD: Normocephalic, atraumatic.  EYES: Pupils equal, round, reactive to light. No scleral icterus.  MOUTH: Nose/mouth/throat not examined due to institutional masking requirements. NECK: Supple. No thyromegaly. Trachea midline. No JVD. No adenopathy. PULMONARY: Good air entry bilaterally. No adventitious sounds. CARDIOVASCULAR: S1 and S2. Regular rate and rhythm.  No rubs, murmurs or gallops heard. ABDOMEN: Benign. MUSCULOSKELETAL: No joint deformity, no clubbing, no edema.  NEUROLOGIC: No overt focal deficit, no gait disturbance, speech is fluent. SKIN: Intact,warm,dry. PSYCH: Mood and behavior normal   Representative image image from CT abdomen performed    Assessment & Plan:     ICD-10-CM   1. Mild persistent asthma without complication  [id number]    Give  a trial of Breo 100, 1 inhalation daily ProAir Respiclick, 2 puffs every 4-6 hours as needed    2. Incidental lung nodule, > 29m and < 849m R91.1 CT CHEST WO CONTRAST   Dedicated CT chest to evaluate     Orders Placed This Encounter  Procedures   CT CHEST WO CONTRAST    Standing Status:   Future    Number of Occurrences:   1    Standing Expiration Date:   07/25/2022    Order Specific Question:   Preferred imaging location?    Answer:   [facility] this encounter  Medications   fluticasone furoate-vilanterol (BREO ELLIPTA) 100-25 MCG/ACT AEPB    Sig: Inhale 1 puff into the lungs daily.    Dispense:  28 each    Refill:  6   We will see her in follow-up in 3 months time she is to contact usKorearior to that time should any new difficulties arise.   C.Renold DonMD Advanced Bronchoscopy PCCM [facility]    *This note was dictated using voice recognition software/Dragon.  Despite best efforts to proofread, errors can occur which can change the meaning. Any transcriptional errors that result from this process are unintentional and may not be fully corrected at the time of dictation.

## 2021-07-24 NOTE — Telephone Encounter (Signed)
Received drug change request from [facility]. ? Preferred medications are Symbicort, Advair or Breo. ? ?Dr. Jayme Cloud, please advise. Thanks ?

## 2021-07-28 NOTE — Telephone Encounter (Signed)
Spoke to [employer]-- Virgel Bouquet went through with a 40 dollar co pay.  ?Nothing further needed.  ? ?

## 2021-08-06 ENCOUNTER — Other Ambulatory Visit: Payer: Self-pay | Admitting: Internal Medicine

## 2021-08-07 ENCOUNTER — Ambulatory Visit (INDEPENDENT_AMBULATORY_CARE_PROVIDER_SITE_OTHER): Payer: Medicare PPO

## 2021-08-07 ENCOUNTER — Other Ambulatory Visit: Payer: Self-pay

## 2021-08-07 DIAGNOSIS — E538 Deficiency of other specified B group vitamins: Secondary | ICD-10-CM

## 2021-08-07 MED ORDER — CYANOCOBALAMIN 1000 MCG/ML IJ SOLN
1000.0000 ug | Freq: Once | INTRAMUSCULAR | Status: AC
  Administered 2021-08-07: 1000 ug via INTRAMUSCULAR

## 2021-08-07 NOTE — Progress Notes (Signed)
Pt arrived for B12 injection, given in R deltoid. Pt tolerated injection well, showed no signs of distress nor voiced any concerns.  

## 2021-08-08 ENCOUNTER — Ambulatory Visit
Admission: RE | Admit: 2021-08-08 | Discharge: 2021-08-08 | Disposition: A | Discharge status: Home / Self Care | Payer: Medicare PPO | Source: Ambulatory Visit | Attending: Pulmonary Disease | Admitting: Pulmonary Disease

## 2021-08-08 DIAGNOSIS — R918 Other nonspecific abnormal finding of lung field: Secondary | ICD-10-CM | POA: Diagnosis not present

## 2021-08-08 DIAGNOSIS — R911 Solitary pulmonary nodule: Secondary | ICD-10-CM | POA: Insufficient documentation

## 2021-08-20 ENCOUNTER — Encounter: Payer: Self-pay | Admitting: Internal Medicine

## 2021-08-21 MED ORDER — FLUTICASONE PROPIONATE 50 MCG/ACT NA SUSP: 1.0000 | Freq: Every day | NASAL | 1 refills | Status: DC

## 2021-08-21 NOTE — Telephone Encounter (Signed)
Flonase has been refilled.  ? ?Alprazolam ?  Refilled: 06/23/2019 ? ?Hydrocortisone cream is a historical medication. ? ?Last OV: 06/27/2021 ?Next OV: not scheduled ?

## 2021-08-24 MED ORDER — ALPRAZOLAM 0.25 MG PO TABS: 0.2500 mg | ORAL_TABLET | Freq: Every evening | ORAL | 5 refills | Status: DC | PRN

## 2021-08-24 MED ORDER — HYDROCORTISONE 0.5 % EX CREA: 1.0000 "application " | TOPICAL_CREAM | Freq: Two times a day (BID) | CUTANEOUS | 5 refills | Status: AC | PRN

## 2021-09-09 ENCOUNTER — Ambulatory Visit (INDEPENDENT_AMBULATORY_CARE_PROVIDER_SITE_OTHER): Payer: Medicare PPO

## 2021-09-09 DIAGNOSIS — E538 Deficiency of other specified B group vitamins: Secondary | ICD-10-CM

## 2021-09-09 MED ORDER — CYANOCOBALAMIN 1000 MCG/ML IJ SOLN
1000.0000 ug | Freq: Once | INTRAMUSCULAR | Status: AC
  Administered 2021-09-09: 1000 ug via INTRAMUSCULAR

## 2021-09-09 NOTE — Progress Notes (Signed)
Patient presented for B 12 injection to left deltoid, patient voiced no concerns nor showed any signs of distress during injection. 

## 2021-10-10 ENCOUNTER — Ambulatory Visit (INDEPENDENT_AMBULATORY_CARE_PROVIDER_SITE_OTHER): Payer: Medicare PPO | Admitting: *Deleted

## 2021-10-10 DIAGNOSIS — E538 Deficiency of other specified B group vitamins: Secondary | ICD-10-CM | POA: Diagnosis not present

## 2021-10-10 MED ORDER — CYANOCOBALAMIN 1000 MCG/ML IJ SOLN
1000.0000 ug | Freq: Once | INTRAMUSCULAR | Status: AC
  Administered 2021-10-10: 1000 ug via INTRAMUSCULAR

## 2021-10-10 NOTE — Progress Notes (Signed)
Pt presented for b12 injection to L Deltoid. Pt received injection with no evidence of issue or distress.

## 2021-10-15 DIAGNOSIS — H903 Sensorineural hearing loss, bilateral: Secondary | ICD-10-CM | POA: Diagnosis not present

## 2021-10-28 ENCOUNTER — Ambulatory Visit: Payer: Medicare PPO | Admitting: Pulmonary Disease

## 2021-10-28 ENCOUNTER — Encounter: Payer: Self-pay | Admitting: Pulmonary Disease

## 2021-10-28 VITALS — BP 114/68 | HR 74 | Temp 97.8°F | Ht 64.0 in | Wt 164.0 lb

## 2021-10-28 DIAGNOSIS — J4540 Moderate persistent asthma, uncomplicated: Secondary | ICD-10-CM

## 2021-10-28 DIAGNOSIS — R911 Solitary pulmonary nodule: Secondary | ICD-10-CM | POA: Diagnosis not present

## 2021-10-28 NOTE — Progress Notes (Signed)
Subjective:    Patient ID: Shelley Zuniga, female    DOB: [birth year], 71 y.o.   MRN: [id number] Patient Care Team: Sherlene Shams, MD as PCP - General (Internal Medicine)  Chief Complaint  Patient presents with   Follow-up    CT 07/2021. No current sx.     HPI The patient is a 71 year old lifelong never smoker with history of asthma and lung nodules presents for follow-up on the issue of lung nodules.  I initially saw her on 24 July 2021 for the details of that visit please refer to that note.  [facility] which has now transferred all of its activity to [facility].  CT scan was 10 Oct 2020 and it was a CT abdomen pelvis showing nodules in the lower lobes.  Recommendation was to follow-up imaging in 6 to 12 months.  She had a dedicated chest CT on 08 August 2021.this study showed stable subcentimeter pulmonary nodules largest with a mean diameter of 5 mm on the left lower lobe.  These nodules were stable.  According to Fleischner Society criteria, patient is a low risk and no further imaging is required.  The patient has been asymptomatic with regards to this finding.  No weight loss or anorexia, no hemoptysis, she has a dry cough on occasion related to her asthma.  She has difficulty with inhalers due to "not getting the hang" of the metered-dose inhalers.  At her last visit we provided her with [facility] and Ellipta and she has been doing well with this.  She had not been able to use  albuterol reliably due to inability to coordinate MDI spray and inhalation.  She does not have a spacer for her inhaler  With regards to her asthma, she notes that she does have distinct seasonal variation on symptoms.  Usually spring and fall of the year are worse.  She does note some exertional dyspnea with inclines and stairs of several years duration.  She will have occasionally a dry cough but no sputum production.  She has not had any chest pain, no  fevers, chills or sweats.  No orthopnea or paroxysmal nocturnal dyspnea.  No lower extremity edema or calf tenderness.  Overall she feels Virgel Bouquet is helping her.  She feels well and looks well.     Review of Systems A 10 point review of systems was performed and it is as noted above otherwise negative.  Patient Active Problem List   Diagnosis Date Noted   Exertional dyspnea 06/27/2021   RUQ abdominal pain 10/22/2020   Pulmonary nodules 10/22/2020   Abdominal aortic atherosclerosis ([facility]) 10/21/2020   Muscle spasm 09/16/2020   Personal history of COVID-19 06/11/2020   Osteoarthritis of thumb, left 12/18/2019   Polyneuropathy in other diseases classified elsewhere ([facility]) 12/18/2019   Insomnia 01/07/2019   Sciatica of left side associated with disorder of lumbar spine 11/21/2016   Impaired fasting glucose 05/15/2016   Major depressive disorder, recurrent episode with melancholic features ([facility]) 11/15/2015   Shingles outbreak 10/01/2014   B12 deficiency 10/20/2013   Vitamin D deficiency 10/20/2013   Routine general medical examination at a health care facility 09/19/2012   Screening for osteoporosis 04/14/2011   Mixed hyperlipidemia 04/14/2011   Screening for cervical cancer 04/13/2011   Screening for breast cancer 04/13/2011   Screening for colon cancer 04/13/2011   Hypertension 03/27/2011   Social History   Tobacco Use   Smoking status:  Never   Smokeless tobacco: Never  Substance Use Topics   Alcohol use: No   Allergies  Allergen Reactions   Morphine And Related Itching   Sulfa Antibiotics Swelling    Tongue   Current Meds  Medication Sig   albuterol (VENTOLIN HFA) 108 (90 Base) MCG/ACT inhaler Inhale 2 puffs into the lungs every 6 (six) hours as needed for wheezing or shortness of breath.   ALPRAZolam (XANAX) 0.25 MG tablet Take 1 tablet (0.25 mg total) by mouth at bedtime as needed for anxiety.   amLODipine (NORVASC) 5 MG tablet TAKE 1 TABLET(5 MG) BY MOUTH DAILY    aspirin 81 MG tablet Take 81 mg by mouth daily.   atorvastatin (LIPITOR) 20 MG tablet TAKE 1 TABLET(20 MG) BY MOUTH DAILY   buPROPion ER (WELLBUTRIN SR) 100 MG 12 hr tablet TAKE 1 TABLET BY MOUTH TWICE DAILY   Cranberry 500 MG CAPS    cyanocobalamin (,VITAMIN B-12,) 1000 MCG/ML injection Inject into the muscle.   Docusate Calcium (STOOL SOFTENER PO) Take by mouth.   estradiol (ESTRACE) 1 MG tablet Take 1 mg by mouth daily.   fluocinonide cream (LIDEX) 0.05 % Apply 1 application. topically as needed.   fluticasone (FLONASE) 50 MCG/ACT nasal spray Place 1 spray into both nostrils daily.   fluticasone furoate-vilanterol (BREO ELLIPTA) 100-25 MCG/ACT AEPB Inhale 1 puff into the lungs daily.   hydrocortisone cream 0.5 % Apply 1 application. topically 2 (two) times daily as needed for itching.   losartan-hydrochlorothiazide (HYZAAR) 100-25 MG tablet TAKE 1 TABLET BY MOUTH DAILY   PREVIDENT 5000 DRY MOUTH 1.1 % GEL dental gel    VITAMIN D PO Take 400 mg by mouth daily.   vitamin E 400 UNIT capsule Take 400 Units by mouth daily.   Immunization History  Administered Date(s) Administered   Fluad Quad(high Dose 65+) 03/19/2020, 02/14/2021   Influenza Split 03/26/2012   Influenza, High Dose Seasonal PF 02/22/2019   Influenza,inj,Quad PF,6+ Mos 02/22/2013, 02/10/2014   Influenza-Unspecified 04/04/2016, 03/13/2017, 01/31/2018   PFIZER Comirnaty(Gray Top)Covid-19 Tri-Sucrose Vaccine 05/25/2019, 06/15/2019   PFIZER(Purple Top)SARS-COV-2 Vaccination 05/25/2019, 06/15/2019, 10/05/2019, 02/12/2020   Pneumococcal Conjugate-13 05/15/2016   Pneumococcal Polysaccharide-23 03/14/2020   Td 12/08/2018   Tdap 01/20/2010   Zoster, Live 10/19/2013      Objective:   Physical Exam BP 114/68 (BP Location: Left Arm, Cuff Size: Normal)   Pulse 74   Temp 97.8 F (36.6 C) (Temporal)   Ht 5\' 4"  (1.626 m)   Wt 164 lb (74.4 kg)   SpO2 96%   BMI 28.15 kg/m   SpO2: 96 % O2 Device: None (Room air)  GENERAL:  Overweight woman, no acute distress, fully ambulatory.  No conversational dyspnea. HEAD: Normocephalic, atraumatic.  EYES: Pupils equal, round, reactive to light. No scleral icterus.  MOUTH: Nose/mouth/throat not examined due to institutional masking requirements. NECK: Supple. No thyromegaly. Trachea midline. No JVD. No adenopathy. PULMONARY: Good air entry bilaterally. No adventitious sounds. CARDIOVASCULAR: S1 and S2. Regular rate and rhythm.  No rubs, murmurs or gallops heard. ABDOMEN: Benign. MUSCULOSKELETAL: No joint deformity, no clubbing, no edema.  NEUROLOGIC: No overt focal deficit, no gait disturbance, speech is fluent. SKIN: Intact,warm,dry. PSYCH: Mood and behavior normal       Assessment & Plan:     ICD-10-CM   1. Moderate persistent asthma without complication  J45.40 Pulmonary Function Test [facility] Only   Continue Breo Ellipta Provided spacer for albuterol Continue as needed albuterol Reassess with PFTs    2.  Incidental lung nodules, > 3mm and < 8mm  R91.1    Reimage 08 August 2021 Largest of the nodules 5 mm No further reimaging needed Consistent with benign etiology     Orders Placed This Encounter  Procedures   Pulmonary Function Test [facility] Only    Standing Status:   Future    Standing Expiration Date:   10/29/2022    Scheduling Instructions:     Within 35mo    Order Specific Question:   Full PFT: includes the following: basic spirometry, spirometry pre & post bronchodilator, diffusion capacity (DLCO), lung volumes    Answer:   Full PFT   Will see the patient in follow-up in 6 months time she is to contact us prior to that time should any new difficulties arise.  Gailen Shelter, MD Advanced Bronchoscopy PCCM [facility]    *This note was dictated using voice recognition software/Dragon.  Despite best efforts to proofread, errors can occur which can change the meaning. Any transcriptional errors that result from this process are  unintentional and may not be fully corrected at the time of dictation.

## 2021-10-28 NOTE — Patient Instructions (Signed)
Continue using your Virgel Bouquet as you are doing.  Remember that you may use your albuterol if you have difficulties with shortness of breath.  However with the [facility] I anticipate this is not going to be a frequent issue.  We will see him in follow-up in 6 months time with breathing tests done prior to return visit.

## 2021-11-06 ENCOUNTER — Other Ambulatory Visit: Payer: Self-pay | Admitting: Internal Medicine

## 2021-11-11 ENCOUNTER — Ambulatory Visit: Payer: Medicare PPO

## 2021-11-11 ENCOUNTER — Ambulatory Visit (INDEPENDENT_AMBULATORY_CARE_PROVIDER_SITE_OTHER): Payer: Medicare PPO

## 2021-11-11 DIAGNOSIS — E538 Deficiency of other specified B group vitamins: Secondary | ICD-10-CM

## 2021-11-11 MED ORDER — CYANOCOBALAMIN 1000 MCG/ML IJ SOLN
1000.0000 ug | Freq: Once | INTRAMUSCULAR | Status: AC
  Administered 2021-11-11: 1000 ug via INTRAMUSCULAR

## 2021-12-11 ENCOUNTER — Ambulatory Visit (INDEPENDENT_AMBULATORY_CARE_PROVIDER_SITE_OTHER): Payer: Medicare PPO

## 2021-12-11 DIAGNOSIS — E538 Deficiency of other specified B group vitamins: Secondary | ICD-10-CM

## 2021-12-11 MED ORDER — CYANOCOBALAMIN 1000 MCG/ML IJ SOLN
1000.0000 ug | Freq: Once | INTRAMUSCULAR | Status: AC
  Administered 2021-12-11: 1000 ug via INTRAMUSCULAR

## 2021-12-11 NOTE — Progress Notes (Signed)
presents today for injection per MD orders. B12 injection administered IM in left Upper Arm. Administration without incident. Patient tolerated well.  Vaniya Augspurger,cma   

## 2021-12-22 DIAGNOSIS — M25571 Pain in right ankle and joints of right foot: Secondary | ICD-10-CM | POA: Diagnosis not present

## 2021-12-22 DIAGNOSIS — S99911A Unspecified injury of right ankle, initial encounter: Secondary | ICD-10-CM | POA: Diagnosis not present

## 2021-12-22 DIAGNOSIS — M79671 Pain in right foot: Secondary | ICD-10-CM | POA: Diagnosis not present

## 2021-12-22 DIAGNOSIS — Y92009 Unspecified place in unspecified non-institutional (private) residence as the place of occurrence of the external cause: Secondary | ICD-10-CM | POA: Diagnosis not present

## 2021-12-22 DIAGNOSIS — W010XXA Fall on same level from slipping, tripping and stumbling without subsequent striking against object, initial encounter: Secondary | ICD-10-CM | POA: Diagnosis not present

## 2021-12-22 DIAGNOSIS — R58 Hemorrhage, not elsewhere classified: Secondary | ICD-10-CM | POA: Diagnosis not present

## 2021-12-22 DIAGNOSIS — S99921A Unspecified injury of right foot, initial encounter: Secondary | ICD-10-CM | POA: Diagnosis not present

## 2021-12-23 DIAGNOSIS — R42 Dizziness and giddiness: Secondary | ICD-10-CM | POA: Diagnosis not present

## 2021-12-23 DIAGNOSIS — J9589 Other postprocedural complications and disorders of respiratory system, not elsewhere classified: Secondary | ICD-10-CM | POA: Diagnosis not present

## 2022-01-02 ENCOUNTER — Other Ambulatory Visit: Payer: Self-pay | Admitting: Unknown Physician Specialty

## 2022-01-02 DIAGNOSIS — Z9189 Other specified personal risk factors, not elsewhere classified: Secondary | ICD-10-CM

## 2022-01-13 ENCOUNTER — Ambulatory Visit (INDEPENDENT_AMBULATORY_CARE_PROVIDER_SITE_OTHER): Payer: Medicare PPO

## 2022-01-13 DIAGNOSIS — E538 Deficiency of other specified B group vitamins: Secondary | ICD-10-CM | POA: Diagnosis not present

## 2022-01-13 MED ORDER — CYANOCOBALAMIN 1000 MCG/ML IJ SOLN
1000.0000 ug | Freq: Once | INTRAMUSCULAR | Status: AC
  Administered 2022-01-13: 1000 ug via INTRAMUSCULAR

## 2022-01-13 NOTE — Progress Notes (Signed)
Pt presented today for b12 injection. Right deltoid, IM. Pt voiced no concerns nor showed any signs of distress during injection.  

## 2022-01-15 ENCOUNTER — Ambulatory Visit
Admission: RE | Admit: 2022-01-15 | Discharge: 2022-01-15 | Disposition: A | Discharge status: Home / Self Care | Payer: Medicare PPO | Source: Ambulatory Visit | Attending: Unknown Physician Specialty | Admitting: Unknown Physician Specialty

## 2022-01-15 DIAGNOSIS — R059 Cough, unspecified: Secondary | ICD-10-CM | POA: Diagnosis not present

## 2022-01-15 DIAGNOSIS — Z9189 Other specified personal risk factors, not elsewhere classified: Secondary | ICD-10-CM | POA: Insufficient documentation

## 2022-01-15 DIAGNOSIS — R1310 Dysphagia, unspecified: Secondary | ICD-10-CM | POA: Diagnosis not present

## 2022-01-15 DIAGNOSIS — R1314 Dysphagia, pharyngoesophageal phase: Secondary | ICD-10-CM | POA: Insufficient documentation

## 2022-01-15 NOTE — Therapy (Signed)
[facility] DIAGNOSTIC RADIOLOGY [address] [place], [place], [address] Phone: [phone]   Fax:     Modified Barium Swallow  Patient Details  Name: Shelley Zuniga MRN: [id number] Date of Birth: Mar 18, 1951 No data recorded  Encounter Date: 01/15/2022   End of Session - 01/15/22 1356     Visit Number 1    Number of Visits 1    Date for SLP Re-Evaluation 01/15/22    SLP Start Time 1300    SLP Stop Time  1400    SLP Time Calculation (min) 60 min    Activity Tolerance Patient tolerated treatment well             Past Medical History:  Diagnosis Date   B12 neuropathy (HCC) 10/20/2013   Colon polyp 2001   colonoscopy, [facility]    Headache    regularly, not migraines   Heart murmur    Hypertension    Motion sickness    PONV (postoperative nausea and vomiting)    Strabismus age 37   eye surgery   Tick bite of abdominal wall 10/22/2020   Wears hearing aid in both ears     Past Surgical History:  Procedure Laterality Date   ABDOMINAL HYSTERECTOMY  2000   [place], [facility], fibroids/menorrhagia   BASAL CELL CARCINOMA EXCISION Left 2013   Dr. Orson Aloe   BILATERAL OOPHORECTOMY  2000   fibroid cysts   CATARACT EXTRACTION W/PHACO Left 07/11/2019   Procedure: CATARACT EXTRACTION PHACO AND INTRAOCULAR LENS PLACEMENT (IOC) LEFT 8.41 00:49.8 ;  Surgeon: Galen Manila, MD;  Location: [facility];  Service: Ophthalmology;  Laterality: Left;   CATARACT EXTRACTION W/PHACO Right 07/25/2019   Procedure: CATARACT EXTRACTION PHACO AND INTRAOCULAR LENS PLACEMENT (IOC) RIGHT TORIC LENS;  Surgeon: Galen Manila, MD;  Location: [facility];  Service: Ophthalmology;  Laterality: Right;  CDE 4.84 U/S 0:35.7   EYE SURGERY  1956   to correct strabismus   GALLBLADDER SURGERY     HEMORRHOID SURGERY      There were no vitals filed for this visit.    Subjective: Patient behavior: (alertness, ability to follow instructions, etc.): pt  A/Ox4; engaged easily and gave description of problem. Pt followed instructions when given.  Chief complaint: dysphagia.    Objective:  Radiological Procedure: A videoflouroscopic evaluation of oral-preparatory, reflex initiation, and pharyngeal phases of the swallow was performed; as well as a screening of the upper esophageal phase.  POSTURE: upright    VIEW: lateral  COMPENSATORY STRATEGIES: general aspiration precautions BOLUSES ADMINISTERED:  Thin Liquid: 2 tsp; 1 single cup; 4 consecutive cup  Nectar-thick Liquid: 1 tsp; 1 single cup; 3 consecutive cup  Honey-thick Liquid: NT   Puree: 1 trial  Mechanical Soft: 1 trial  Pills WHOLE in Puree: 1 trial RESULTS OF EVALUATION: ORAL PREPARATORY PHASE: (The lips, tongue, and velum are observed for strength and coordination)       **Overall Severity Rating: grossly WFL: piecemealing  SWALLOW INITIATION/REFLEX: (The reflex is normal if "triggered" by the time the bolus reached the base of the tongue)  **Overall Severity Rating: grossly WFL-Mild. Premature spillage w/ liquids from the Valleculae to the PS during pharyngeal swallow initiation. Aspiration on/below the VC x1 w/ Nectar consistency liquids consecutive sips(challenged). Immediate Cough w/ clearing.   PHARYNGEAL PHASE: (Pharyngeal function is normal if the bolus shows rapid, smooth, and continuous transit through the pharynx and there is no pharyngeal residue after the swallow)  **Overall Severity Rating: [facility].  LARYNGEAL PENETRATION: (Material entering into the laryngeal inlet/vestibule but not aspirated): transient w/ thins; underneath coating epiglottis  ASPIRATION: x1 w/ Nectar consecutive sips(challenged) ESOPHAGEAL PHASE: (Screening of the upper esophagus): transient, trace-min retention of bolus residue at ~C5(?) -- at the level of apparent bony protrusion (cervical osteophyte). Clearance noted w/ throughout study; no buildup of residue.    ASSESSMENT:  PLAN/RECOMMENDATIONS:  A. Diet: regular; thin liquids. Pills Whole in Puree per pt preference for ease/safety of swallowing  B. Swallowing Precautions: general aspiration precautions -- including Small, Single sips Slowly  C. Recommended consultation to: f/u w/ PCP re: REFLUX   D. Therapy recommendations: NONE  E. Results and recommendations were discussed w/ pt; video reviewed and questions answered. Recommendations discussed and agreed upon by pt.       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 11/21/2016   Impaired fasting glucose 05/15/2016   Major depressive disorder, recurrent episode with melancholic features (HCC) 11/15/2015   Shingles outbreak 10/01/2014   B12 deficiency 10/20/2013   Vitamin D deficiency 10/20/2013   Routine general medical examination at a health care facility 09/19/2012   Screening for osteoporosis 04/14/2011   Mixed hyperlipidemia 04/14/2011   Screening for cervical cancer 04/13/2011   Screening for breast cancer 04/13/2011   Screening for colon cancer 04/13/2011   Hypertension 03/27/2011         Jerilynn Som, MS, CCC-SLP Speech Language Pathologist Rehab Services; [facility] [phone] ([address]) Flan[place], CCC-SLP 01/15/2022, 2:00 PM  [facility] DIAGNOSTIC RADIOLOGY [address] [place], [place], [address] Phone: [phone]   Fax:     Name: Shelley Zuniga MRN: [id number] Date of Birth: [birth year]

## 2022-01-20 ENCOUNTER — Other Ambulatory Visit: Payer: Self-pay

## 2022-01-20 DIAGNOSIS — E785 Hyperlipidemia, unspecified: Secondary | ICD-10-CM

## 2022-01-20 MED ORDER — ATORVASTATIN CALCIUM 20 MG PO TABS: 20.0000 mg | ORAL_TABLET | Freq: Every day | ORAL | 3 refills | Status: DC

## 2022-02-02 ENCOUNTER — Encounter: Payer: Self-pay | Admitting: Internal Medicine

## 2022-02-03 MED ORDER — AMLODIPINE BESYLATE 5 MG PO TABS: ORAL_TABLET | ORAL | 1 refills | Status: DC

## 2022-02-13 ENCOUNTER — Ambulatory Visit (INDEPENDENT_AMBULATORY_CARE_PROVIDER_SITE_OTHER): Payer: Medicare PPO

## 2022-02-13 DIAGNOSIS — E538 Deficiency of other specified B group vitamins: Secondary | ICD-10-CM | POA: Diagnosis not present

## 2022-02-13 MED ORDER — CYANOCOBALAMIN 1000 MCG/ML IJ SOLN
1000.0000 ug | Freq: Once | INTRAMUSCULAR | Status: AC
  Administered 2022-02-13: 1000 ug via INTRAMUSCULAR

## 2022-02-13 NOTE — Progress Notes (Signed)
Pt presented for her B12 injection. Pt was identified through two identifiers. Pt tolerated shot well in the left deltoid. 

## 2022-02-15 ENCOUNTER — Other Ambulatory Visit: Payer: Self-pay | Admitting: Pulmonary Disease

## 2022-02-16 ENCOUNTER — Other Ambulatory Visit (HOSPITAL_COMMUNITY): Payer: Self-pay

## 2022-02-16 ENCOUNTER — Other Ambulatory Visit: Payer: Self-pay

## 2022-02-16 MED ORDER — FLUTICASONE FUROATE-VILANTEROL 100-25 MCG/ACT IN AEPB: 1.0000 | INHALATION_SPRAY | Freq: Every day | RESPIRATORY_TRACT | 6 refills | Status: DC

## 2022-02-17 ENCOUNTER — Telehealth: Payer: Self-pay | Admitting: Pulmonary Disease

## 2022-02-17 NOTE — Telephone Encounter (Signed)
Spoke to [place] with [facility]. She stated that Rx will be ready for pickup later today.  Patient is aware of above message and voiced her understanding.  Nothing further needed.

## 2022-02-19 ENCOUNTER — Encounter: Payer: Self-pay | Admitting: Internal Medicine

## 2022-03-03 ENCOUNTER — Encounter: Payer: Self-pay | Admitting: Internal Medicine

## 2022-03-04 ENCOUNTER — Other Ambulatory Visit: Payer: Self-pay

## 2022-03-04 MED ORDER — LOSARTAN POTASSIUM-HCTZ 100-25 MG PO TABS: 1.0000 | ORAL_TABLET | Freq: Every day | ORAL | 3 refills | Status: DC

## 2022-03-04 NOTE — Telephone Encounter (Signed)
Rx sent patient notified 

## 2022-03-13 ENCOUNTER — Ambulatory Visit (LOCAL_COMMUNITY_HEALTH_CENTER): Payer: Medicare PPO

## 2022-03-13 DIAGNOSIS — Z23 Encounter for immunization: Secondary | ICD-10-CM

## 2022-03-13 DIAGNOSIS — Z719 Counseling, unspecified: Secondary | ICD-10-CM

## 2022-03-13 NOTE — Progress Notes (Signed)
Fluzone High dose administered IM right deltoid; tolerated well.  VIS given.  [facility] updated and copy to pt.  Tonny Branch, RN

## 2022-03-17 ENCOUNTER — Ambulatory Visit (INDEPENDENT_AMBULATORY_CARE_PROVIDER_SITE_OTHER): Payer: Medicare PPO

## 2022-03-17 DIAGNOSIS — E538 Deficiency of other specified B group vitamins: Secondary | ICD-10-CM | POA: Diagnosis not present

## 2022-03-17 MED ORDER — CYANOCOBALAMIN 1000 MCG/ML IJ SOLN
1000.0000 ug | Freq: Once | INTRAMUSCULAR | Status: AC
  Administered 2022-03-17: 1000 ug via INTRAMUSCULAR

## 2022-03-17 NOTE — Progress Notes (Signed)
Pt arrived for B12 injection, given in L deltoid. Pt tolerated injection well, showed no signs of distress nor voiced any concerns.  ?

## 2022-03-30 DIAGNOSIS — D2262 Melanocytic nevi of left upper limb, including shoulder: Secondary | ICD-10-CM | POA: Diagnosis not present

## 2022-03-30 DIAGNOSIS — D2272 Melanocytic nevi of left lower limb, including hip: Secondary | ICD-10-CM | POA: Diagnosis not present

## 2022-03-30 DIAGNOSIS — D2271 Melanocytic nevi of right lower limb, including hip: Secondary | ICD-10-CM | POA: Diagnosis not present

## 2022-03-30 DIAGNOSIS — D225 Melanocytic nevi of trunk: Secondary | ICD-10-CM | POA: Diagnosis not present

## 2022-03-30 DIAGNOSIS — L821 Other seborrheic keratosis: Secondary | ICD-10-CM | POA: Diagnosis not present

## 2022-03-30 DIAGNOSIS — D2261 Melanocytic nevi of right upper limb, including shoulder: Secondary | ICD-10-CM | POA: Diagnosis not present

## 2022-03-30 DIAGNOSIS — L309 Dermatitis, unspecified: Secondary | ICD-10-CM | POA: Diagnosis not present

## 2022-04-03 DIAGNOSIS — M3501 Sicca syndrome with keratoconjunctivitis: Secondary | ICD-10-CM | POA: Diagnosis not present

## 2022-04-03 DIAGNOSIS — Z0100 Encounter for examination of eyes and vision without abnormal findings: Secondary | ICD-10-CM | POA: Diagnosis not present

## 2022-04-03 DIAGNOSIS — H26493 Other secondary cataract, bilateral: Secondary | ICD-10-CM | POA: Diagnosis not present

## 2022-04-16 ENCOUNTER — Ambulatory Visit: Payer: Medicare PPO

## 2022-04-16 ENCOUNTER — Encounter: Payer: Self-pay | Admitting: Internal Medicine

## 2022-04-16 ENCOUNTER — Ambulatory Visit: Payer: Medicare PPO | Admitting: Internal Medicine

## 2022-04-16 VITALS — BP 128/76 | HR 75 | Temp 97.8°F | Wt 164.6 lb

## 2022-04-16 DIAGNOSIS — E782 Mixed hyperlipidemia: Secondary | ICD-10-CM | POA: Diagnosis not present

## 2022-04-16 DIAGNOSIS — Z1231 Encounter for screening mammogram for malignant neoplasm of breast: Secondary | ICD-10-CM | POA: Diagnosis not present

## 2022-04-16 DIAGNOSIS — Z0000 Encounter for general adult medical examination without abnormal findings: Secondary | ICD-10-CM

## 2022-04-16 DIAGNOSIS — I10 Essential (primary) hypertension: Secondary | ICD-10-CM

## 2022-04-16 DIAGNOSIS — R053 Chronic cough: Secondary | ICD-10-CM

## 2022-04-16 DIAGNOSIS — F339 Major depressive disorder, recurrent, unspecified: Secondary | ICD-10-CM | POA: Diagnosis not present

## 2022-04-16 DIAGNOSIS — R7301 Impaired fasting glucose: Secondary | ICD-10-CM

## 2022-04-16 DIAGNOSIS — E538 Deficiency of other specified B group vitamins: Secondary | ICD-10-CM

## 2022-04-16 DIAGNOSIS — R0609 Other forms of dyspnea: Secondary | ICD-10-CM

## 2022-04-16 LAB — COMPREHENSIVE METABOLIC PANEL
ALT: 13 U/L (ref 0–35)
AST: 12 U/L (ref 0–37)
Albumin: 4.2 g/dL (ref 3.5–5.2)
Alkaline Phosphatase: 56 U/L (ref 39–117)
BUN: 19 mg/dL (ref 6–23)
CO2: 30 mEq/L (ref 19–32)
Calcium: 9.3 mg/dL (ref 8.4–10.5)
Chloride: 99 mEq/L (ref 96–112)
Creatinine, Ser: 0.79 mg/dL (ref 0.40–1.20)
GFR: 74.84 mL/min (ref >60.00)
Glucose, Bld: 103 mg/dL — ABNORMAL HIGH (ref 70–99)
Potassium: 3.5 mEq/L (ref 3.5–5.1)
Sodium: 137 mEq/L (ref 135–145)
Total Bilirubin: 0.6 mg/dL (ref 0.2–1.2)
Total Protein: 7.2 g/dL (ref 6.0–8.3)

## 2022-04-16 LAB — B12 AND FOLATE PANEL
Folate: 19.4 ng/mL (ref >5.9)
Vitamin B-12: >1500 pg/mL — ABNORMAL HIGH (ref 211–911)

## 2022-04-16 MED ORDER — PREDNISONE 10 MG PO TABS: ORAL_TABLET | ORAL | 0 refills | Status: DC

## 2022-04-16 NOTE — Assessment & Plan Note (Addendum)
Seeing Shelley Zuniga ,  taking Breo,  reports dyspnea only with walking hills.    Not short of breath with yardwork  since starting [facility]

## 2022-04-16 NOTE — Progress Notes (Signed)
Patient ID: Shelley Zuniga, female    DOB: 11/03/1950  Age: 71 y.o. MRN: [id number]  The patient is here for annual preventive examination and management of other chronic and acute problems.   The risk factors are reflected in the social history.  The roster of all physicians providing medical care to patient - is listed in the Snapshot section of the chart.  Activities of daily living:  The patient is 100% independent in all ADLs: dressing, toileting, feeding as well as independent mobility  Home safety : The patient has smoke detectors in the home. They wear seatbelts.  There are no firearms at home. There is no violence in the home.   There is no risks for hepatitis, STDs or HIV. There is no   history of blood transfusion. They have no travel history to infectious disease endemic areas of the world.  The patient has seen their dentist in the last six month. They have seen their eye doctor in the last year. They admit to slight hearing difficulty with regard to whispered voices and some television programs.  They have deferred audiologic testing in the last year.  They do not  have excessive sun exposure. Discussed the need for sun protection: hats, long sleeves and use of sunscreen if there is significant sun exposure.   Diet: the importance of a healthy diet is discussed. They do have a healthy diet.  The benefits of regular aerobic exercise were discussed. She walks 4 times per week ,  20 minutes.   Depression screen: there are no signs or vegative symptoms of depression- irritability, change in appetite, anhedonia, sadness/tearfullness.  Cognitive assessment: the patient manages all their financial and personal affairs and is actively engaged. They could relate day,date,year and events; recalled 2/3 objects at 3 minutes; performed clock-face test normally.  The following portions of the patient's history were reviewed and updated as appropriate: allergies, current medications, past family  history, past medical history,  past surgical history, past social history  and problem list.  Visual acuity was not assessed per patient preference since she has regular follow up with her ophthalmologist. Hearing and body mass index were assessed and reviewed.   During the course of the visit the patient was educated and counseled about appropriate screening and preventive services including : fall prevention , diabetes screening, nutrition counseling, colorectal cancer screening, and recommended immunizations.    CC: The primary encounter diagnosis was Breast cancer screening by mammogram. Diagnoses of Primary hypertension, Impaired fasting glucose, Mixed hyperlipidemia, B12 deficiency, Exertional dyspnea, Major depressive disorder, recurrent episode with melancholic features (HCC), Chronic cough, and Encounter for preventive health examination were also pertinent to this visit.  1) Saw ENT for cough with eating.  Swallow test done at [facility]  function was  normal. Told it may be a medication effect  (ARB)   History Dalayza has a past medical history of B12 neuropathy (HCC) (10/20/2013), Colon polyp (2001), Headache, Heart murmur, Hypertension, Motion sickness, PONV (postoperative nausea and vomiting), Strabismus (age 39), Tick bite of abdominal wall (10/22/2020), and Wears hearing aid in both ears.   She has a past surgical history that includes Hemorrhoid surgery; Gallbladder surgery; Eye surgery (1956); Bilateral oophorectomy (2000); Abdominal hysterectomy (2000); Excision basal cell carcinoma (Left, 2013); Cataract extraction w/PHACO (Left, 07/11/2019); and Cataract extraction w/PHACO (Right, 07/25/2019).   Her family history includes COPD in her mother; Cancer in her brother, father, maternal grandmother, and mother; Hyperlipidemia in her mother; Hypertension in her mother.She reports that  she has never smoked. She has never used smokeless tobacco. She reports that she does not drink alcohol and does not  use drugs.  Outpatient Medications Prior to Visit  Medication Sig Dispense Refill   albuterol (VENTOLIN HFA) 108 (90 Base) MCG/ACT inhaler Inhale 2 puffs into the lungs every 6 (six) hours as needed for wheezing or shortness of breath.     ALPRAZolam (XANAX) 0.25 MG tablet Take 1 tablet (0.25 mg total) by mouth at bedtime as needed for anxiety. 30 tablet 5   amLODipine (NORVASC) 5 MG tablet TAKE 1 TABLET(5 MG) BY MOUTH DAILY 90 tablet 1   aspirin 81 MG tablet Take 81 mg by mouth daily.     atorvastatin (LIPITOR) 20 MG tablet Take 1 tablet (20 mg total) by mouth daily. 90 tablet 3   buPROPion ER (WELLBUTRIN SR) 100 MG 12 hr tablet TAKE 1 TABLET BY MOUTH TWICE DAILY 180 tablet 3   Cranberry 500 MG CAPS      cyanocobalamin (,VITAMIN B-12,) 1000 MCG/ML injection Inject into the muscle.     Docusate Calcium (STOOL SOFTENER PO) Take by mouth.     estradiol (ESTRACE) 1 MG tablet Take 1 mg by mouth daily.     fluocinonide cream (LIDEX) 0.05 % Apply 1 application. topically as needed.     fluticasone (FLONASE) 50 MCG/ACT nasal spray Place 1 spray into both nostrils daily. 48 g 1   fluticasone furoate-vilanterol (BREO ELLIPTA) 100-25 MCG/ACT AEPB Inhale 1 puff into the lungs daily. 60 each 6   hydrocortisone cream 0.5 % Apply 1 application. topically 2 (two) times daily as needed for itching. 30 g 5   losartan-hydrochlorothiazide (HYZAAR) 100-25 MG tablet Take 1 tablet by mouth daily. 90 tablet 3   PREVIDENT 5000 DRY MOUTH 1.1 % GEL dental gel      VITAMIN D PO Take 400 mg by mouth daily.     vitamin E 400 UNIT capsule Take 400 Units by mouth daily.     Facility-Administered Medications Prior to Visit  Medication Dose Route Frequency Provider Last Rate Last Admin   methylPREDNISolone acetate (DEPO-MEDROL) injection 40 mg  40 mg Intramuscular Once Sherlene Shams, MD        Review of Systems  Patient denies headache, fevers, malaise, unintentional weight loss, skin rash, eye pain, sinus congestion  and sinus pain, sore throat, dysphagia,  hemoptysis , cough, dyspnea, wheezing, chest pain, palpitations, orthopnea, edema, abdominal pain, nausea, melena, diarrhea, constipation, flank pain, dysuria, hematuria, urinary  Frequency, nocturia, numbness, tingling, seizures,  Focal weakness, Loss of consciousness,  Tremor, insomnia, depression, anxiety, and suicidal ideation.     Objective:  BP 128/76   Pulse 75   Temp 97.8 F (36.6 C) (Oral)   Wt 164 lb 9.6 oz (74.7 kg)   SpO2 99%   BMI 28.25 kg/m   Physical Exam  General appearance: alert, cooperative and appears stated age Ears: normal TM's and external ear canals both ears Throat: lips, mucosa, and tongue normal; teeth and gums normal Neck: no adenopathy, no carotid bruit, supple, symmetrical, trachea midline and thyroid not enlarged, symmetric, no tenderness/mass/nodules Back: symmetric, no curvature. ROM normal. No CVA tenderness. Lungs: clear to auscultation bilaterally Heart: regular rate and rhythm, S1, S2 normal, no murmur, click, rub or gallop Abdomen: soft, non-tender; bowel sounds normal; no masses,  no organomegaly Pulses: 2+ and symmetric Skin: Skin color, texture, turgor normal. No rashes or lesions Lymph nodes: Cervical, supraclavicular, and axillary nodes normal.   Assessment &  Plan:   Problem List Items Addressed This Visit     B12 deficiency   Relevant Orders   B12 and Folate Panel (Completed)   B12   Chronic cough    Includes asthma,  GERD,  EE and medication effect.  Encouraged to montor effect of cold liquids on cough;  if no effect will suspend losartan as a trial       Encounter for preventive health examination    age appropriate education and counseling updated, referrals for preventative services and immunizations addressed, dietary and smoking counseling addressed, most recent labs reviewed.  I have personally reviewed and have noted:   1) the patient's medical and social history 2) The pt's use of  alcohol, tobacco, and illicit drugs 3) The patient's current medications and supplements 4) Functional ability including ADL's, fall risk, home safety risk, hearing and visual impairment 5) Diet and physical activities 6) Evidence for depression or mood disorder 7) The patient's height, weight, and BMI have been recorded in the chart  I have made referrals, and provided counseling and education based on review of the above       Exertional dyspnea    Seeing LG ,  taking Breo,  reports dyspnea only with walking hills.    Not short of breath with yardwork  since starting Breo       Hypertension   Impaired fasting glucose   Relevant Orders   Comprehensive metabolic panel (Completed)   Major depressive disorder, recurrent episode with melancholic features (HCC)    Has been managed for over 5 years with wellbutrin.  Symptoms .aggravated by having to spend most time with mother ans controlled with wellbutrin .   No changes today       Mixed hyperlipidemia   Relevant Orders   Lipid Panel w/reflex Direct LDL (Completed)   Other Visit Diagnoses     Breast cancer screening by mammogram    -  Primary   Relevant Orders   MM 3D SCREEN BREAST BILATERAL        Follow-up: Return in about 6 months (around 10/15/2022).   Sherlene Shams, MD

## 2022-04-16 NOTE — Patient Instructions (Signed)
Continue current Blood pressure meds and make a note of whether your cough only occurs with cold beverages.  If it occurs with ANY temperature beverage,  let me know and we will try suspending the losartan.   Your exam is normal today.  If symptoms of illness worsen,  check a home COVID test and send me  a message   The prednisone taper I sent in will be helpful in COVID and RSV infections.   The ShingRx vaccine is now available in local pharmacies and is 100% covered by Medicare.  It causes flu like symptoms  so wait until after the holidays   You will need one more pneumonia vaccine next year

## 2022-04-16 NOTE — Progress Notes (Signed)
Patient presented for B 12 injection to left deltoid, patient voiced no concerns nor showed any signs of distress during injection. 

## 2022-04-17 LAB — LIPID PANEL W/REFLEX DIRECT LDL
Cholesterol: 176 mg/dL (ref <200)
HDL: 78 mg/dL (ref >=50)
LDL Cholesterol (Calc): 77 mg/dL (calc)
Non-HDL Cholesterol (Calc): 98 mg/dL (calc) (ref <130)
Total CHOL/HDL Ratio: 2.3 (calc) (ref <5.0)
Triglycerides: 127 mg/dL (ref <150)

## 2022-04-18 DIAGNOSIS — R053 Chronic cough: Secondary | ICD-10-CM | POA: Insufficient documentation

## 2022-04-18 NOTE — Assessment & Plan Note (Signed)
Includes asthma,  GERD,  EE and medication effect.  Encouraged to montor effect of cold liquids on cough;  if no effect will suspend losartan as a trial

## 2022-04-18 NOTE — Assessment & Plan Note (Signed)

## 2022-04-18 NOTE — Assessment & Plan Note (Signed)
Has been managed for over 5 years with wellbutrin.  Symptoms .aggravated by having to spend most time with mother ans controlled with wellbutrin .   No changes today

## 2022-04-22 ENCOUNTER — Ambulatory Visit: Payer: Medicare PPO

## 2022-04-24 ENCOUNTER — Other Ambulatory Visit: Payer: Self-pay

## 2022-04-24 MED ORDER — FLUTICASONE PROPIONATE 50 MCG/ACT NA SUSP: 1.0000 | Freq: Every day | NASAL | 1 refills | Status: DC

## 2022-04-27 ENCOUNTER — Ambulatory Visit: Payer: Medicare PPO | Admitting: Pulmonary Disease

## 2022-05-04 MED ORDER — CYANOCOBALAMIN 1000 MCG/ML IJ SOLN
1000.0000 ug | Freq: Once | INTRAMUSCULAR | Status: AC
  Administered 2022-04-16: 1000 ug via INTRAMUSCULAR

## 2022-05-04 NOTE — Addendum Note (Signed)
Addended by: [place], Asna Muldrow on: 05/04/2022 03:48 PM   Modules accepted: Orders

## 2022-05-07 DIAGNOSIS — H903 Sensorineural hearing loss, bilateral: Secondary | ICD-10-CM | POA: Diagnosis not present

## 2022-05-20 ENCOUNTER — Ambulatory Visit: Payer: Medicare PPO

## 2022-05-21 ENCOUNTER — Ambulatory Visit (INDEPENDENT_AMBULATORY_CARE_PROVIDER_SITE_OTHER): Payer: Medicare PPO

## 2022-05-21 DIAGNOSIS — E538 Deficiency of other specified B group vitamins: Secondary | ICD-10-CM

## 2022-05-21 MED ORDER — CYANOCOBALAMIN 1000 MCG/ML IJ SOLN
1000.0000 ug | Freq: Once | INTRAMUSCULAR | Status: AC
  Administered 2022-05-21: 1000 ug via INTRAMUSCULAR

## 2022-05-21 NOTE — Progress Notes (Signed)
Patient was administered a B12 injection in her Left Arm. Patient tolerated the injection well. Patient did not show signs of distress or voice any concerns. 

## 2022-05-27 ENCOUNTER — Encounter: Payer: Self-pay | Admitting: Internal Medicine

## 2022-05-27 ENCOUNTER — Ambulatory Visit (LOCAL_COMMUNITY_HEALTH_CENTER): Payer: Medicare PPO

## 2022-05-27 DIAGNOSIS — Z23 Encounter for immunization: Secondary | ICD-10-CM

## 2022-05-27 DIAGNOSIS — E785 Hyperlipidemia, unspecified: Secondary | ICD-10-CM

## 2022-05-27 DIAGNOSIS — Z719 Counseling, unspecified: Secondary | ICD-10-CM

## 2022-05-27 MED ORDER — ATORVASTATIN CALCIUM 20 MG PO TABS: 20.0000 mg | ORAL_TABLET | Freq: Every day | ORAL | 3 refills | Status: DC

## 2022-05-27 NOTE — Progress Notes (Signed)
Eligible for RSV vaccine, administered to Right deltoid, tolerated well. Provided VIS and updated [facility] copy. M.Everest Hacking, LPN.

## 2022-06-04 ENCOUNTER — Encounter: Payer: Self-pay | Admitting: Pulmonary Disease

## 2022-06-04 ENCOUNTER — Ambulatory Visit: Payer: Medicare PPO | Admitting: Pulmonary Disease

## 2022-06-04 ENCOUNTER — Ambulatory Visit: Payer: Medicare PPO | Attending: Pulmonary Disease

## 2022-06-04 VITALS — BP 130/82 | HR 84 | Temp 98.2°F | Ht 64.0 in | Wt 162.6 lb

## 2022-06-04 DIAGNOSIS — R911 Solitary pulmonary nodule: Secondary | ICD-10-CM | POA: Diagnosis not present

## 2022-06-04 DIAGNOSIS — J45909 Unspecified asthma, uncomplicated: Secondary | ICD-10-CM | POA: Diagnosis not present

## 2022-06-04 DIAGNOSIS — R0600 Dyspnea, unspecified: Secondary | ICD-10-CM | POA: Diagnosis not present

## 2022-06-04 DIAGNOSIS — J4540 Moderate persistent asthma, uncomplicated: Secondary | ICD-10-CM

## 2022-06-04 LAB — PULMONARY FUNCTION TEST ARMC ONLY
DL/VA % pred: 97 %
DL/VA: 4.03 ml/min/mmHg/L
DLCO unc % pred: 85 %
DLCO unc: 16.49 ml/min/mmHg
FEF 25-75 Post: 2.02 L/sec
FEF 25-75 Pre: 1.82 L/sec
FEF2575-%Change-Post: 11 %
FEF2575-%Pred-Post: 112 %
FEF2575-%Pred-Pre: 100 %
FEV1-%Change-Post: 4 %
FEV1-%Pred-Post: 109 %
FEV1-%Pred-Pre: 105 %
FEV1-Post: 2.42 L
FEV1-Pre: 2.32 L
FEV1FVC-%Change-Post: 3 %
FEV1FVC-%Pred-Pre: 97 %
FEV6-%Change-Post: 0 %
FEV6-%Pred-Post: 113 %
FEV6-%Pred-Pre: 112 %
FEV6-Post: 3.16 L
FEV6-Pre: 3.13 L
FEV6FVC-%Change-Post: 0 %
FEV6FVC-%Pred-Post: 104 %
FEV6FVC-%Pred-Pre: 104 %
FVC-%Change-Post: 1 %
FVC-%Pred-Post: 108 %
FVC-%Pred-Pre: 107 %
FVC-Post: 3.17 L
FVC-Pre: 3.14 L
Post FEV1/FVC ratio: 76 %
Post FEV6/FVC ratio: 100 %
Pre FEV1/FVC ratio: 74 %
Pre FEV6/FVC Ratio: 100 %
RV % pred: 103 %
RV: 2.31 L
TLC % pred: 105 %
TLC: 5.34 L

## 2022-06-04 MED ORDER — PROAIR RESPICLICK 108 (90 BASE) MCG/ACT IN AEPB: 2.0000 | INHALATION_SPRAY | Freq: Four times a day (QID) | RESPIRATORY_TRACT | 3 refills | Status: DC | PRN

## 2022-06-04 MED ORDER — ALBUTEROL SULFATE (2.5 MG/3ML) 0.083% IN NEBU
2.5000 mg | INHALATION_SOLUTION | Freq: Once | RESPIRATORY_TRACT | Status: AC
  Administered 2022-06-04: 2.5 mg via RESPIRATORY_TRACT
  Filled 2022-06-04: qty 3

## 2022-06-04 NOTE — Progress Notes (Signed)
Subjective:    Patient ID: Shelley Zuniga, female    DOB: June 28, 1950, 72 y.o.   MRN: [id number] Patient Care Team: Sherlene Shams, MD as PCP - General (Internal Medicine)  Chief Complaint  Patient presents with   Follow-up    Occasional SOB with exertion. No wheezing or cough.    HPI Shelley Zuniga is a 72 year old lifelong never smoker who presents here for the follow-up on the issue of dyspnea in the setting of moderate persistent asthma.  She was last seen on 28 October 2021.  She had pulmonary function testing performed on 04 June 2022.  She follows to discuss these findings and next steps.  She has been maintained on Breo Ellipta 100/25, 1 inhalation daily.  She occasionally notes the need for albuterol however she does not have the coordination to use the MDI she prefers powdered based inhalers because she feels that she cannot get "the hang" of the MDI.  Overall however she feels that the Virgel Bouquet is controlling her symptoms well she does not get dyspneic doing her gardening chores any longer and her dyspnea on exertion is only mild.  She attributes this mostly now to lack of conditioning.  She has not had any fevers, chills or sweats.  No cough or sputum production.  No chest pain, no orthopnea or paroxysmal nocturnal dyspnea no lower extremity edema nor calf tenderness.  Overall she feels well and looks well.  We discussed her pulmonary function testing today it looks markedly better than her testing in February 2023.  DATA 07/15/2021 PFTs: FEV1 1.68 L or 75% predicted, FVC 2.96 L or 106% predicted, FEV1/FVC 83%, significant bronchodilator response with a net change of 21% and FEV1 diffusion capacity normal, lung volumes normal, consistent with moderate obstructive airways disease with significant bronchodilator response (asthmatic type). 06/04/2022 PFTs: FEV11.62 L or 105% predicted, FVC 2.23 L or 107% predicted, FEV1/FVC 66%, postbronchodilator her lung volumes normalized.  Normal diffusion  capacity.  Consistent with mild obstructive defect asthmatic type.  Review of Systems A 10 point review of systems was performed and it is as noted above otherwise negative.  Patient Active Problem List   Diagnosis Date Noted   Chronic cough 04/18/2022   Exertional dyspnea 06/27/2021   RUQ abdominal pain 10/22/2020   Pulmonary nodules 10/22/2020   Abdominal aortic atherosclerosis (HCC) 10/21/2020   Muscle spasm 09/16/2020   Personal history of COVID-19 06/11/2020   Osteoarthritis of thumb, left 12/18/2019   Polyneuropathy in other diseases classified elsewhere (HCC) 12/18/2019   Insomnia 01/07/2019   Sciatica of left side associated with disorder of lumbar spine 11/21/2016   Impaired fasting glucose 05/15/2016   Major depressive disorder, recurrent episode with melancholic features (HCC) 11/15/2015   Shingles outbreak 10/01/2014   B12 deficiency 10/20/2013   Vitamin D deficiency 10/20/2013   Encounter for preventive health examination 09/19/2012   Screening for osteoporosis 04/14/2011   Mixed hyperlipidemia 04/14/2011   Screening for cervical cancer 04/13/2011   Screening for breast cancer 04/13/2011   Screening for colon cancer 04/13/2011   Hypertension 03/27/2011   Social History   Tobacco Use   Smoking status: Never   Smokeless tobacco: Never  Substance Use Topics   Alcohol use: No   Current Meds  Medication Sig   albuterol (VENTOLIN HFA) 108 (90 Base) MCG/ACT inhaler Inhale 2 puffs into the lungs every 6 (six) hours as needed for wheezing or shortness of breath.   ALPRAZolam (XANAX) 0.25 MG tablet Take 1 tablet (0.25 mg  total) by mouth at bedtime as needed for anxiety.   amLODipine (NORVASC) 5 MG tablet TAKE 1 TABLET(5 MG) BY MOUTH DAILY   aspirin 81 MG tablet Take 81 mg by mouth daily.   atorvastatin (LIPITOR) 20 MG tablet Take 1 tablet (20 mg total) by mouth daily.   buPROPion ER (WELLBUTRIN SR) 100 MG 12 hr tablet TAKE 1 TABLET BY MOUTH TWICE DAILY   Cranberry  500 MG CAPS Take 1 capsule by mouth daily.   cyanocobalamin (,VITAMIN B-12,) 1000 MCG/ML injection Inject into the muscle.   Docusate Calcium (STOOL SOFTENER PO) Take 1 capsule by mouth daily.   estradiol (ESTRACE) 1 MG tablet Take 1 mg by mouth daily.   fluocinonide cream (LIDEX) 0.35 % Apply 1 application. topically as needed.   fluticasone (FLONASE) 50 MCG/ACT nasal spray Place 1 spray into both nostrils daily.   fluticasone furoate-vilanterol (BREO ELLIPTA) 100-25 MCG/ACT AEPB Inhale 1 puff into the lungs daily.   hydrocortisone cream 0.5 % Apply 1 application. topically 2 (two) times daily as needed for itching.   losartan-hydrochlorothiazide (HYZAAR) 100-25 MG tablet Take 1 tablet by mouth daily.   PREVIDENT 5000 DRY MOUTH 1.1 % GEL dental gel    VITAMIN D PO Take 400 mg by mouth daily.   vitamin E 400 UNIT capsule Take 400 Units by mouth daily.   Current Facility-Administered Medications for the 06/04/22 encounter (Office Visit) with Tyler Pita, MD  Medication   methylPREDNISolone acetate (DEPO-MEDROL) injection 40 mg   Immunization History  Administered Date(s) Administered   Fluad Quad(high Dose 65+) 03/19/2020, 02/14/2021   Influenza Split 03/26/2012   Influenza, High Dose Seasonal PF 02/22/2019, 03/13/2022   Influenza,inj,Quad PF,6+ Mos 02/22/2013, 02/10/2014   Influenza-Unspecified 04/04/2016, 03/13/2017, 01/31/2018   PFIZER Comirnaty(Gray Top)Covid-19 Tri-Sucrose Vaccine 05/25/2019, 06/15/2019   PFIZER(Purple Top)SARS-COV-2 Vaccination 05/25/2019, 06/15/2019, 10/05/2019, 02/12/2020   Pneumococcal Conjugate-13 05/15/2016   Pneumococcal Polysaccharide-23 03/14/2020   Rsv, Bivalent, Protein Subunit Rsvpref,pf Evans Lance) 05/27/2022   Td 12/08/2018   Tdap 01/20/2010   Zoster, Live 10/19/2013        Objective:   Physical Exam BP 130/82 (BP Location: Left Arm, Cuff Size: Normal)   Pulse 84   Temp 98.2 F (36.8 C)   Ht 5\' 4"  (1.626 m)   Wt 162 lb 9.6 oz (73.8 kg)    SpO2 96%   BMI 27.91 kg/m   SpO2: 96 % O2 Device: None (Room air)  GENERAL: Overweight woman, no acute distress, fully ambulatory.  No conversational dyspnea. HEAD: Normocephalic, atraumatic.  EYES: Pupils equal, round, reactive to light. No scleral icterus.  MOUTH: Dentition intact, oral mucosa moist.  No thrush. NECK: Supple. No thyromegaly. Trachea midline. No JVD. No adenopathy. PULMONARY: Good air entry bilaterally. No adventitious sounds. CARDIOVASCULAR: S1 and S2. Regular rate and rhythm.  No rubs, murmurs or gallops heard. ABDOMEN: Benign. MUSCULOSKELETAL: No joint deformity, no clubbing, no edema.  NEUROLOGIC: No overt focal deficit, no gait disturbance, speech is fluent. SKIN: Intact,warm,dry. PSYCH: Mood and behavior normal     Assessment & Plan:     ICD-10-CM   1. Moderate persistent asthma without complication  K09.38    Patient is doing well on Breo Ellipta 100 Continue same Overall improvement on pulmonary function    2. Incidental lung nodules, > 60mm and < 70mm  R91.1    CT chest March 2023 he showed no change Nodules 5 mm at the largest Likely benign No further follow-up needed     Meds ordered  this encounter  Medications   Albuterol Sulfate (PROAIR RESPICLICK) 741 (90 Base) MCG/ACT AEPB    Sig: Inhale 2 puffs into the lungs every 6 (six) hours as needed (shortness of breath or wheezing).    Dispense:  1 each    Refill:  3    Patient is unable to coordinate MDI use but can activate respiclick device   The patient has issues with poor coordination using albuterol MDI.  She does need a rescue inhaler so we will provide her with ProAir Respiclick.  This hopefully will make it easier for her to get her rescue medication when needed.  Will see the patient in follow-up in 6 months time she is to contact us prior to that time should any new difficulties arise.  Renold Don, MD Advanced Bronchoscopy P[facility]    *This note  was dictated using voice recognition software/Dragon.  Despite best efforts to proofread, errors can occur which can change the meaning. Any transcriptional errors that result from this process are unintentional and may not be fully corrected at the time of dictation.

## 2022-06-04 NOTE — Patient Instructions (Signed)
Your breathing tests are better from the ones performed in February.  Continue using Breo 100, 1 puff daily.  We have switched your albuterol prescription to see if the device is easier for you to use.  The device is called ProAir Respiclick.  We will see you in follow-up in 6 months time call sooner should any new problems arise.

## 2022-06-15 ENCOUNTER — Encounter: Payer: Self-pay | Admitting: Pulmonary Disease

## 2022-06-16 DIAGNOSIS — J340 Abscess, furuncle and carbuncle of nose: Secondary | ICD-10-CM | POA: Diagnosis not present

## 2022-06-16 DIAGNOSIS — R040 Epistaxis: Secondary | ICD-10-CM | POA: Diagnosis not present

## 2022-06-22 ENCOUNTER — Ambulatory Visit (INDEPENDENT_AMBULATORY_CARE_PROVIDER_SITE_OTHER): Payer: Medicare PPO

## 2022-06-22 DIAGNOSIS — E538 Deficiency of other specified B group vitamins: Secondary | ICD-10-CM | POA: Diagnosis not present

## 2022-06-22 MED ORDER — CYANOCOBALAMIN 1000 MCG/ML IJ SOLN
1000.0000 ug | Freq: Once | INTRAMUSCULAR | Status: AC
  Administered 2022-06-22: 1000 ug via INTRAMUSCULAR

## 2022-06-22 NOTE — Progress Notes (Signed)
Pt presented for their vitamin B12 injection. Pt was identified through two identifiers. Pt tolerated shot well in their right deltoid.  

## 2022-07-01 DIAGNOSIS — J340 Abscess, furuncle and carbuncle of nose: Secondary | ICD-10-CM | POA: Diagnosis not present

## 2022-07-02 DIAGNOSIS — Z1231 Encounter for screening mammogram for malignant neoplasm of breast: Secondary | ICD-10-CM | POA: Diagnosis not present

## 2022-07-02 DIAGNOSIS — R92323 Mammographic fibroglandular density, bilateral breasts: Secondary | ICD-10-CM | POA: Diagnosis not present

## 2022-07-04 ENCOUNTER — Other Ambulatory Visit: Payer: Self-pay | Admitting: Internal Medicine

## 2022-07-18 ENCOUNTER — Encounter: Payer: Self-pay | Admitting: Pulmonary Disease

## 2022-07-23 ENCOUNTER — Ambulatory Visit (INDEPENDENT_AMBULATORY_CARE_PROVIDER_SITE_OTHER): Payer: Medicare PPO

## 2022-07-23 ENCOUNTER — Telehealth: Payer: Self-pay | Admitting: Internal Medicine

## 2022-07-23 DIAGNOSIS — E538 Deficiency of other specified B group vitamins: Secondary | ICD-10-CM | POA: Diagnosis not present

## 2022-07-23 MED ORDER — CYANOCOBALAMIN 1000 MCG/ML IJ SOLN
1000.0000 ug | Freq: Once | INTRAMUSCULAR | Status: DC
  Administered 2022-07-23: 1000 ug via INTRAMUSCULAR

## 2022-07-23 NOTE — Telephone Encounter (Signed)
Contacted Tally Joe to schedule their annual wellness visit. Appointment made for 07/31/2022.  Thank you,  [place] [facility]  [phone]

## 2022-07-23 NOTE — Progress Notes (Signed)
Patient arrived for her B12 injection. Patient was administered her B12 into her left deltoid. Patient tolerated the injection well and did not show any signs of distress or voice any concerns.

## 2022-07-29 ENCOUNTER — Encounter: Payer: Self-pay | Admitting: Internal Medicine

## 2022-07-31 ENCOUNTER — Ambulatory Visit (INDEPENDENT_AMBULATORY_CARE_PROVIDER_SITE_OTHER): Payer: Medicare PPO

## 2022-07-31 VITALS — Ht 64.0 in | Wt 162.0 lb

## 2022-07-31 DIAGNOSIS — Z0000 Encounter for general adult medical examination without abnormal findings: Secondary | ICD-10-CM | POA: Diagnosis not present

## 2022-07-31 NOTE — Progress Notes (Addendum)
Subjective:   Shelley Zuniga is a 72 y.o. female who presents for Medicare Annual (Subsequent) preventive examination.  Review of Systems    No ROS.  Medicare Wellness Virtual Visit.  Visual/audio telehealth visit, UTA vital signs.   See social history for additional risk factors.   Cardiac Risk Factors include: advanced age (>69men, >107 women)     Objective:    Today's Vitals   07/31/22 1103  Weight: 162 lb (73.5 kg)  Height: 5\' 4"  (1.626 m)   Body mass index is 27.81 kg/m.     07/31/2022   10:55 AM 06/16/2021    1:36 PM 10/10/2020    5:28 AM 06/13/2020   12:08 PM 07/25/2019    8:09 AM 06/13/2019   11:17 AM  Advanced Directives  Does Patient Have a Medical Advance Directive? Yes No No No No No  Type of Paramedic of [place];Living will       Does patient want to make changes to medical advance directive? No - Patient declined       Copy of [place] in Chart? No - copy requested       Would patient like information on creating a medical advance directive?  No - Patient declined No - Patient declined No - Patient declined No - Patient declined No - Patient declined    Current Medications (verified) Outpatient Encounter Medications as of 07/31/2022  Medication Sig   Albuterol Sulfate (PROAIR RESPICLICK) [id number] (90 Base) MCG/ACT AEPB Inhale 2 puffs into the lungs every 6 (six) hours as needed (shortness of breath or wheezing).   ALPRAZolam (XANAX) 0.25 MG tablet Take 1 tablet (0.25 mg total) by mouth at bedtime as needed for anxiety.   amLODipine (NORVASC) 5 MG tablet TAKE 1 TABLET(5 MG) BY MOUTH DAILY   aspirin 81 MG tablet Take 81 mg by mouth daily.   atorvastatin (LIPITOR) 20 MG tablet Take 1 tablet (20 mg total) by mouth daily.   buPROPion ER (WELLBUTRIN SR) 100 MG 12 hr tablet TAKE 1 TABLET BY MOUTH TWICE DAILY   Cranberry 500 MG CAPS Take 1 capsule by mouth daily.   Docusate Calcium (STOOL SOFTENER PO) Take 1 capsule by mouth  daily.   estradiol (ESTRACE) 1 MG tablet Take 1 mg by mouth daily.   fluocinonide cream (LIDEX) [id number] % Apply 1 application. topically as needed.   fluticasone (FLONASE) 50 MCG/ACT nasal spray Place 1 spray into both nostrils daily.   fluticasone furoate-vilanterol (BREO ELLIPTA) 100-25 MCG/ACT AEPB Inhale 1 puff into the lungs daily.   hydrocortisone cream 0.5 % Apply 1 application. topically 2 (two) times daily as needed for itching.   losartan-hydrochlorothiazide (HYZAAR) 100-25 MG tablet Take 1 tablet by mouth daily.   predniSONE (DELTASONE) 10 MG tablet 6 tablets on Day 1 , then reduce by 1 tablet daily until gone (Patient not taking: Reported on 06/04/2022)   PREVIDENT 5000 DRY MOUTH 1.1 % GEL dental gel    VITAMIN D PO Take 400 mg by mouth daily.   vitamin E 400 UNIT capsule Take 400 Units by mouth daily.   No facility-administered encounter medications on file as of 07/31/2022.    Allergies (verified) Morphine and related and Sulfa antibiotics   History: Past Medical History:  Diagnosis Date   B12 neuropathy ([place]) 10/20/2013   Colon polyp 2001   colonoscopy, [facility]    Headache    regularly, not migraines   Heart murmur    Hypertension  Motion sickness    PONV (postoperative nausea and vomiting)    Strabismus age 28   eye surgery   Tick bite of abdominal wall 10/22/2020   Wears hearing aid in both ears    Past Surgical History:  Procedure Laterality Date   ABDOMINAL HYSTERECTOMY  2000   [place], [facility], fibroids/menorrhagia   BASAL CELL CARCINOMA EXCISION Left 2013   Dr. Koleen Nimrod   BILATERAL OOPHORECTOMY  2000   fibroid cysts   CATARACT EXTRACTION W/PHACO Left 07/11/2019   Procedure: CATARACT EXTRACTION PHACO AND INTRAOCULAR LENS PLACEMENT (I[facility]) LEFT 8.41 00:49.8 ;  Surgeon: Birder Robson, MD;  Location: [facility];  Service: Ophthalmology;  Laterality: Left;   CATARACT EXTRACTION W/PHACO Right 07/25/2019   Procedure: CATARACT EXTRACTION PHACO AND INTRAOCULAR  LENS PLACEMENT ([place]) RIGHT TORIC LENS;  Surgeon: Birder Robson, MD;  Location: [facility];  Service: Ophthalmology;  Laterality: Right;  CDE 4.84 U/S 0:35.7   EYE SURGERY  1956   to correct strabismus   GALLBLADDER SURGERY     HEMORRHOID SURGERY     Family History  Problem Relation Age of Onset   Cancer Mother        melanoma, squamous cell on nose   Hypertension Mother    COPD Mother    Hyperlipidemia Mother    Cancer Father        lung, tobacco user   Cancer Brother        throat, tobacco user   Cancer Maternal Grandmother        uterine/ovarian   Social History   Socioeconomic History   Marital status: Married    Spouse name: Not on file   Number of children: Not on file   Years of education: Not on file   Highest education level: Not on file  Occupational History   Not on file  Tobacco Use   Smoking status: Never   Smokeless tobacco: Never  Vaping Use   Vaping Use: Never used  Substance and Sexual Activity   Alcohol use: No   Drug use: No   Sexual activity: Yes    Birth control/protection: Post-menopausal  Other Topics Concern   Not on file  Social History Narrative   Not on file   Social Determinants of Health   Financial Resource Strain: Low Risk  (07/29/2022)   Overall Financial Resource Strain (CARDIA)    Difficulty of Paying Living Expenses: Not hard at all  Food Insecurity: No Food Insecurity (07/29/2022)   Hunger Vital Sign    Worried About Running Out of Food in the Last Year: Never true    [place] in the Last Year: Never true  Transportation Needs: No Transportation Needs (07/29/2022)   PRAPARE - Hydrologist (Medical): No    Lack of Transportation (Non-Medical): No  Physical Activity: Insufficiently Active (07/29/2022)   Exercise Vital Sign    Days of Exercise per Week: 3 days    Minutes of Exercise per Session: 10 min  Stress: Stress Concern Present (07/29/2022)   [place]    Feeling of Stress : To some extent  Social Connections: Unknown (07/29/2022)   Social Connection and Isolation Panel [NHANES]    Frequency of Communication with Friends and Family: More than three times a week    Frequency of Social Gatherings with Friends and Family: Once a week    Attends Religious Services: Not on file  Active Member of Clubs or Organizations: Yes    Attends Archivist Meetings: More than 4 times per year    Marital Status: Married    Tobacco Counseling Counseling given: Not Answered   Clinical Intake:  Pre-visit preparation completed: Yes        Diabetes: No  How often do you need to have someone help you when you read instructions, pamphlets, or other written materials from your doctor or pharmacy?: 1 - Never    Interpreter Needed?: No     Activities of Daily Living    07/29/2022    8:30 PM 06/24/2022   12:07 PM  In your present state of health, do you have any difficulty performing the following activities:  Hearing? 1 1  Vision? 0 0  Difficulty concentrating or making decisions? 0 0  Walking or climbing stairs? 0 0  Dressing or bathing? 0 0  Doing errands, shopping? 0 0  Preparing Food and eating ? N N  Using the Toilet? N N  In the past six months, have you accidently leaked urine? Y Y  Do you have problems with loss of bowel control? N N  Managing your Medications? N N  Managing your Finances? N N  Housekeeping or managing your Housekeeping? N N    Patient Care Team: Crecencio Mc, MD as PCP - General (Internal Medicine) Tyler Pita, MD as Consulting Physician (Pulmonary Disease)  Indicate any recent Medical Services you may have received from other than [facility] providers in the past year (date may be approximate).     Assessment:   This is a routine wellness examination for Shelley Zuniga.  Patient Medicare AWV questionnaire was completed by the patient on  07/29/22, I have confirmed that all information answered by patient is correct and no changes since this date.   I connected with  Shelley Zuniga on 07/31/22 by a audio enabled telemedicine application and verified that I am speaking with the correct person using two identifiers.  Patient Location: Home  Provider Location: Office/Clinic  I discussed the limitations of evaluation and management by telemedicine. The patient expressed understanding and agreed to proceed.   Hearing/Vision screen Hearing Screening - Comments:: Hearing aid, crossover Vision Screening - Comments:: Followed by Dr. George Ina Wears corrective lenses  Cataract extraction, bilateral   Dietary issues and exercise activities discussed: Current Exercise Habits: Home exercise routine, Type of exercise: walking, Intensity: Mild Healthy diet Good water intake    Goals Addressed               This Visit's Progress     Patient Stated     Healthy Lifestyle (pt-stated)        Eat a healthy diet. Stay active; increase as weather warms       Depression Screen    07/31/2022   10:54 AM 04/16/2022   12:09 PM 06/27/2021   11:09 AM 06/27/2021   11:04 AM 06/16/2021    1:35 PM 10/21/2020   11:18 AM 09/16/2020   11:07 AM  PHQ 2/9 Scores  PHQ - 2 Score 0 0 2 2 0 0 1  PHQ- 9 Score   9    4    Fall Risk    07/29/2022    8:30 PM 06/24/2022   12:07 PM 04/16/2022   12:09 PM 06/27/2021   11:04 AM 06/16/2021    1:38 PM  Fall Risk   Falls in the past year? 1 1 0 0 0  Number falls  in past yr: 0 0 0  0  Injury with Fall? 1 1 0    Comment Swollen ankle      Risk for fall due to :   No Fall Risks No Fall Risks   Follow up Falls evaluation completed;Falls prevention discussed  Falls evaluation completed Falls evaluation completed Falls evaluation completed    FALL RISK PREVENTION PERTAINING TO THE HOME: Home free of loose throw rugs in walkways, pet beds, electrical cords, etc? Yes  Adequate lighting in your home to reduce  risk of falls? Yes   ASSISTIVE DEVICES UTILIZED TO PREVENT FALLS: Life alert? No  Use of a cane, walker or w/c? No  Grab bars in the bathroom? No  Shower chair or bench in shower? No  Elevated toilet seat or a handicapped toilet? No   TIMED UP AND GO: Was the test performed? No .   Cognitive Function:        07/31/2022   11:05 AM 06/13/2019   11:27 AM  6CIT Screen  What Year? 0 points 0 points  What month? 0 points 0 points  What time? 0 points 0 points  Count back from 20 0 points 0 points  Months in reverse 0 points 0 points  Repeat phrase 0 points   Total Score 0 points     Immunizations Immunization History  Administered Date(s) Administered   Fluad Quad(high Dose 65+) 03/19/2020, 02/14/2021   Influenza Split 03/26/2012   Influenza, High Dose Seasonal PF 02/22/2019, 03/13/2022   Influenza,inj,Quad PF,6+ Mos 02/22/2013, 02/10/2014   Influenza-Unspecified 04/04/2016, 03/13/2017, 01/31/2018   PFIZER Comirnaty(Gray Top)Covid-19 Tri-Sucrose Vaccine 05/25/2019, 06/15/2019   PFIZER(Purple Top)SARS-COV-2 Vaccination 05/25/2019, 06/15/2019, 10/05/2019, 02/12/2020   Pneumococcal Conjugate-13 05/15/2016   Pneumococcal Polysaccharide-23 03/14/2020   Rsv, Bivalent, Protein Subunit Rsvpref,pf Evans Lance) 05/27/2022   Td 12/08/2018   Tdap 01/20/2010   Zoster, Live 10/19/2013   Shingrix Completed?: No.    Education has been provided regarding the importance of this vaccine. Patient has been advised to call insurance company to determine out of pocket expense if they have not yet received this vaccine. Advised may also receive vaccine at local pharmacy or Health Dept. Verbalized acceptance and understanding.  Screening Tests Health Maintenance  Topic Date Due   MAMMOGRAM  02/14/2022   COVID-19 Vaccine (7 - 2023-24 season) 08/16/2022 (Originally 01/16/2022)   Zoster Vaccines- Shingrix (1 of 2) 10/31/2022 (Originally 01/27/1970)   Fecal DNA (Cologuard)  01/03/2023   Medicare Annual  Wellness (AWV)  07/31/2023   DTaP/Tdap/Td (3 - Td or Tdap) 12/07/2028   Pneumonia Vaccine 32+ Years old  Completed   INFLUENZA VACCINE  Completed   DEXA SCAN  Completed   Hepatitis C Screening  Completed   HPV VACCINES  Aged Out    Health Maintenance Health Maintenance Due  Topic Date Due   MAMMOGRAM  02/14/2022   Mammogram- [facility] at [facility] 07/02/22.  Lung Cancer Screening: (Low Dose CT Chest recommended if Age 67-80 years, 30 pack-year currently smoking OR have quit w/in 15years.) does not qualify.   Hepatitis C Screening: Completed 02/2020.  Vision Screening: Recommended annual ophthalmology exams for early detection of glaucoma and other disorders of the eye.   Dental Screening: Recommended annual dental exams for proper oral hygiene  Community Resource Referral / Chronic Care Management: CRR required this visit?  No   CCM required this visit?  No      Plan:     I have personally reviewed and noted the following in the  patient's chart:   Medical and social history Use of alcohol, tobacco or illicit drugs  Current medications and supplements including opioid prescriptions. Patient is not currently taking opioid prescriptions. Functional ability and status Nutritional status Physical activity Advanced directives List of other physicians Hospitalizations, surgeries, and ER visits in previous 12 months Vitals Screenings to include cognitive, depression, and falls Referrals and appointments  In addition, I have reviewed and discussed with patient certain preventive protocols, quality metrics, and best practice recommendations. A written personalized care plan for preventive services as well as general preventive health recommendations were provided to patient.     [place], LPN   [id number]    I have reviewed the above information and agree with above.   Deborra Medina, MD

## 2022-07-31 NOTE — Patient Instructions (Addendum)
Shelley Zuniga , Thank you for taking time to come for your Medicare Wellness Visit. I appreciate your ongoing commitment to your health goals. Please review the following plan we discussed and let me know if I can assist you in the future.   These are the goals we discussed:  Goals       Patient Stated     Healthy Lifestyle (pt-stated)      Eat a healthy diet. Stay active; increase as weather warms        This is a list of the screening recommended for you and due dates:  Health Maintenance  Topic Date Due   Mammogram  02/14/2022   COVID-19 Vaccine (7 - 2023-24 season) 08/16/2022*   Zoster (Shingles) Vaccine (1 of 2) 10/31/2022*   Cologuard (Stool DNA test)  01/03/2023   Medicare Annual Wellness Visit  07/31/2023   DTaP/Tdap/Td vaccine (3 - Td or Tdap) 12/07/2028   Pneumonia Vaccine  Completed   Flu Shot  Completed   DEXA scan (bone density measurement)  Completed   Hepatitis C Screening: USPSTF Recommendation to screen - Ages 23-79 yo.  Completed   HPV Vaccine  Aged Out  *Topic was postponed. The date shown is not the original due date.    Advanced directives: End of life planning; Advance aging; Advanced directives discussed.  Copy of current HCPOA/Living Will requested.    Conditions/risks identified: none new  Next appointment: Follow up in one year for your annual wellness visit    Preventive Care 65 Years and Older, Female Preventive care refers to lifestyle choices and visits with your health care provider that can promote health and wellness. What does preventive care include? A yearly physical exam. This is also called an annual well check. Dental exams once or twice a year. Routine eye exams. Ask your health care provider how often you should have your eyes checked. Personal lifestyle choices, including: Daily care of your teeth and gums. Regular physical activity. Eating a healthy diet. Avoiding tobacco and drug use. Limiting alcohol use. Practicing safe  sex. Taking low-dose aspirin every day. Taking vitamin and mineral supplements as recommended by your health care provider. What happens during an annual well check? The services and screenings done by your health care provider during your annual well check will depend on your age, overall health, lifestyle risk factors, and family history of disease. Counseling  Your health care provider may ask you questions about your: Alcohol use. Tobacco use. Drug use. Emotional well-being. Home and relationship well-being. Sexual activity. Eating habits. History of falls. Memory and ability to understand (cognition). Work and work Statistician. Reproductive health. Screening  You may have the following tests or measurements: Height, weight, and BMI. Blood pressure. Lipid and cholesterol levels. These may be checked every 5 years, or more frequently if you are over 73 years old. Skin check. Lung cancer screening. You may have this screening every year starting at age 77 if you have a 30-pack-year history of smoking and currently smoke or have quit within the past 15 years. Fecal occult blood test (FOBT) of the stool. You may have this test every year starting at age 4. Flexible sigmoidoscopy or colonoscopy. You may have a sigmoidoscopy every 5 years or a colonoscopy every 10 years starting at age 84. Hepatitis C blood test. Hepatitis B blood test. Sexually transmitted disease (STD) testing. Diabetes screening. This is done by checking your blood sugar (glucose) after you have not eaten for a while (fasting). You may  have this done every 1-3 years. Bone density scan. This is done to screen for osteoporosis. You may have this done starting at age 69. Mammogram. This may be done every 1-2 years. Talk to your health care provider about how often you should have regular mammograms. Talk with your health care provider about your test results, treatment options, and if necessary, the need for more  tests. Vaccines  Your health care provider may recommend certain vaccines, such as: Influenza vaccine. This is recommended every year. Tetanus, diphtheria, and acellular pertussis (Tdap, Td) vaccine. You may need a Td booster every 10 years. Zoster vaccine. You may need this after age 63. Pneumococcal 13-valent conjugate (PCV13) vaccine. One dose is recommended after age 12. Pneumococcal polysaccharide (PPSV23) vaccine. One dose is recommended after age 50. Talk to your health care provider about which screenings and vaccines you need and how often you need them. This information is not intended to replace advice given to you by your health care provider. Make sure you discuss any questions you have with your health care provider. Document Released: 05/31/2015 Document Revised: 01/22/2016 Document Reviewed: 03/05/2015 Elsevier Interactive Patient Education  2017 [employer] Prevention in the Home Falls can cause injuries. They can happen to people of all ages. There are many things you can do to make your home safe and to help prevent falls. What can I do on the outside of my home? Regularly fix the edges of walkways and driveways and fix any cracks. Remove anything that might make you trip as you walk through a door, such as a raised step or threshold. Trim any bushes or trees on the path to your home. Use bright outdoor lighting. Clear any walking paths of anything that might make someone trip, such as rocks or tools. Regularly check to see if handrails are loose or broken. Make sure that both sides of any steps have handrails. Any raised decks and porches should have guardrails on the edges. Have any leaves, snow, or ice cleared regularly. Use sand or salt on walking paths during winter. Clean up any spills in your garage right away. This includes oil or grease spills. What can I do in the bathroom? Use night lights. Install grab bars by the toilet and in the tub and shower.  Do not use towel bars as grab bars. Use non-skid mats or decals in the tub or shower. If you need to sit down in the shower, use a plastic, non-slip stool. Keep the floor dry. Clean up any water that spills on the floor as soon as it happens. Remove soap buildup in the tub or shower regularly. Attach bath mats securely with double-sided non-slip rug tape. Do not have throw rugs and other things on the floor that can make you trip. What can I do in the bedroom? Use night lights. Make sure that you have a light by your bed that is easy to reach. Do not use any sheets or blankets that are too big for your bed. They should not hang down onto the floor. Have a firm chair that has side arms. You can use this for support while you get dressed. Do not have throw rugs and other things on the floor that can make you trip. What can I do in the kitchen? Clean up any spills right away. Avoid walking on wet floors. Keep items that you use a lot in easy-to-reach places. If you need to reach something above you, use a strong step  stool that has a grab bar. Keep electrical cords out of the way. Do not use floor polish or wax that makes floors slippery. If you must use wax, use non-skid floor wax. Do not have throw rugs and other things on the floor that can make you trip. What can I do with my stairs? Do not leave any items on the stairs. Make sure that there are handrails on both sides of the stairs and use them. Fix handrails that are broken or loose. Make sure that handrails are as long as the stairways. Check any carpeting to make sure that it is firmly attached to the stairs. Fix any carpet that is loose or worn. Avoid having throw rugs at the top or bottom of the stairs. If you do have throw rugs, attach them to the floor with carpet tape. Make sure that you have a light switch at the top of the stairs and the bottom of the stairs. If you do not have them, ask someone to add them for you. What else  can I do to help prevent falls? Wear shoes that: Do not have high heels. Have rubber bottoms. Are comfortable and fit you well. Are closed at the toe. Do not wear sandals. If you use a stepladder: Make sure that it is fully opened. Do not climb a closed stepladder. Make sure that both sides of the stepladder are locked into place. Ask someone to hold it for you, if possible. Clearly mark and make sure that you can see: Any grab bars or handrails. First and last steps. Where the edge of each step is. Use tools that help you move around (mobility aids) if they are needed. These include: Canes. Walkers. Scooters. Crutches. Turn on the lights when you go into a dark area. Replace any light bulbs as soon as they burn out. Set up your furniture so you have a clear path. Avoid moving your furniture around. If any of your floors are uneven, fix them. If there are any pets around you, be aware of where they are. Review your medicines with your doctor. Some medicines can make you feel dizzy. This can increase your chance of falling. Ask your doctor what other things that you can do to help prevent falls. This information is not intended to replace advice given to you by your health care provider. Make sure you discuss any questions you have with your health care provider. Document Released: 02/28/2009 Document Revised: 10/10/2015 Document Reviewed: 06/08/2014 Elsevier Interactive Patient Education  2017 [employer].

## 2022-08-24 ENCOUNTER — Ambulatory Visit: Payer: Medicare PPO

## 2022-08-24 NOTE — Progress Notes (Deleted)
Pt presented for their vitamin B12 injection. Pt was identified through two identifiers. Pt tolerated shot well in their left or right deltoid.  

## 2022-08-27 ENCOUNTER — Ambulatory Visit (INDEPENDENT_AMBULATORY_CARE_PROVIDER_SITE_OTHER): Payer: Medicare PPO

## 2022-08-27 DIAGNOSIS — E538 Deficiency of other specified B group vitamins: Secondary | ICD-10-CM

## 2022-08-27 MED ORDER — CYANOCOBALAMIN 1000 MCG/ML IJ SOLN
1000.0000 ug | Freq: Once | INTRAMUSCULAR | Status: AC
Start: 2022-08-27 — End: 2022-08-27
  Administered 2022-08-27: 1000 ug via INTRAMUSCULAR

## 2022-08-27 NOTE — Progress Notes (Signed)
Patient presented for B 12 injection to left deltoid, patient voiced no concerns nor showed any signs of distress during injection Patient presented for B 12 injection to left deltoid, patient voiced no concerns nor showed any signs of distress during injection. 

## 2022-09-16 ENCOUNTER — Other Ambulatory Visit: Payer: Self-pay | Admitting: Pulmonary Disease

## 2022-09-28 ENCOUNTER — Ambulatory Visit (INDEPENDENT_AMBULATORY_CARE_PROVIDER_SITE_OTHER): Payer: Medicare PPO

## 2022-09-28 DIAGNOSIS — E538 Deficiency of other specified B group vitamins: Secondary | ICD-10-CM | POA: Diagnosis not present

## 2022-09-28 MED ORDER — CYANOCOBALAMIN 1000 MCG/ML IJ SOLN
1000.0000 ug | Freq: Once | INTRAMUSCULAR | Status: AC
Start: 2022-09-28 — End: 2022-09-28
  Administered 2022-09-28: 1000 ug via INTRAMUSCULAR

## 2022-09-28 NOTE — Progress Notes (Signed)
Pt presented for their vitamin B12 injection. Pt was identified through two identifiers. Pt tolerated shot well in their right deltoid.  

## 2022-10-02 ENCOUNTER — Other Ambulatory Visit: Payer: Self-pay | Admitting: Family

## 2022-10-02 IMAGING — MR MR ABDOMEN WO/W CM MRCP
17 of 20 series · 38 of 48 positions shown · IV contrast (7ml Gadavist)
Comparison: CT October 10, 2020

CLINICAL DATA: Intermittent right upper quadrant pain with biliary
ductal dilation on prior CT

EXAM:
MRI ABDOMEN WITHOUT AND WITH CONTRAST (INCLUDING MRCP)
TECHNIQUE: Multiplanar multisequence MR imaging of the abdomen was performed
both before and after the administration of intravenous contrast.
Heavily T2-weighted images of the biliary and pancreatic ducts were
obtained, and three-dimensional MRCP images were rendered by post
processing.
CONTRAST:  7mL GADAVIST GADOBUTROL 1 MMOL/ML IV SOLN

[Series 4: T2 · axial · 6.0mm · 1.19mm/px · 1 of 32 slices shown]
[im 1/32]
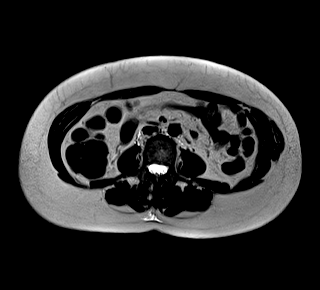

[Series 5: in & out · axial · 3.0mm · 1.19mm/px · z∈[-50,+163]mm · 2 of 72 slices shown (1 of 2)]
[im 1/72]
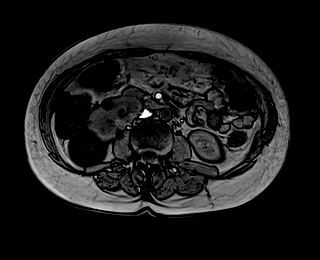
[im 72/72]
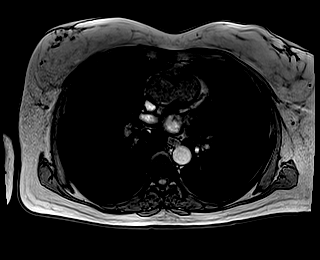

[Series 6: in & out · axial · 3.0mm · 1.19mm/px · z∈[-50,+163]mm · 3 of 72 slices shown (2 of 2)]
[im 1/72]
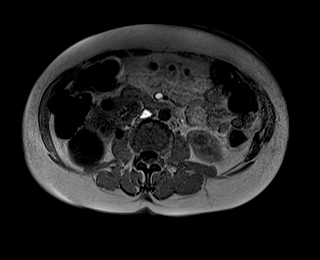
[im 36/72]
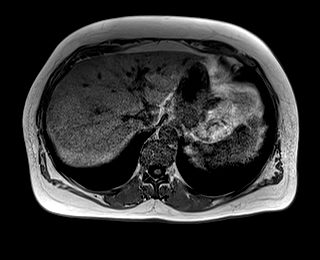
[im 72/72]
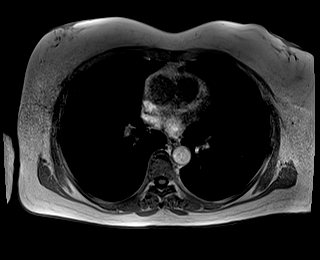

[Series 9: T2 fat-sat · axial · 6.0mm · 1.19mm/px · 1 of 30 slices shown]
[im 1/30]
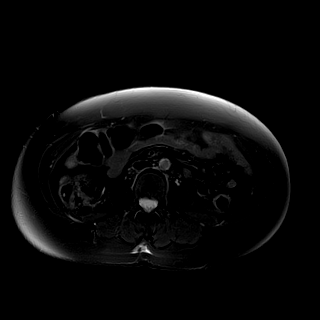

[Series 10: ax dwi_tracew · axial · 6.0mm · 1.42mm/px · z∈[-58,+151]mm · 4 of 90 slices shown]
[im 1/90]
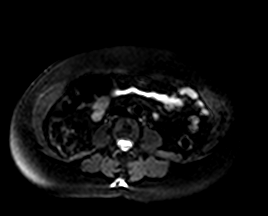
[im 30/90]
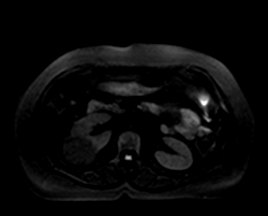
[im 60/90]
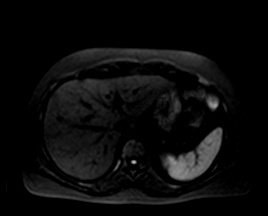
[im 90/90]
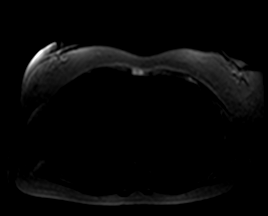

[Series 11: ax dwi_adc · axial · 6.0mm · 1.42mm/px · 1 of 30 slices shown]
[im 1/30]
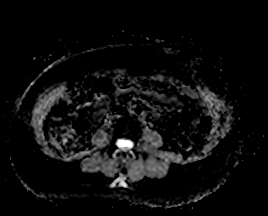

[Series 13: MRCP · coronal · 3.0mm · 1.12mm/px · 1 of 17 slices shown]
[im 1/17]
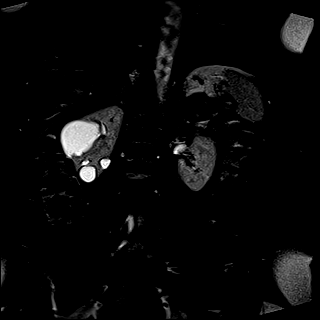

[Series 16: radials · coronal · 50.0mm · 0.78mm/px · 1 of 5 slices shown]
[im 1/5]
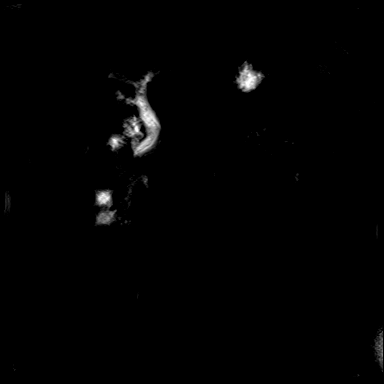

[Series 19: t2_space_cor_cs20_trig_384_iso · coronal · 1.0mm · 0.49mm/px · 3 of 72 slices shown]
[im 1/72]
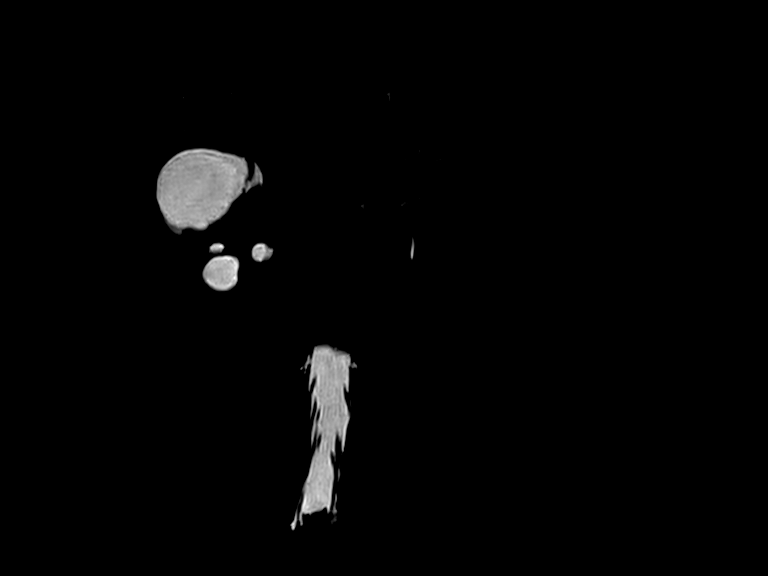
[im 36/72]
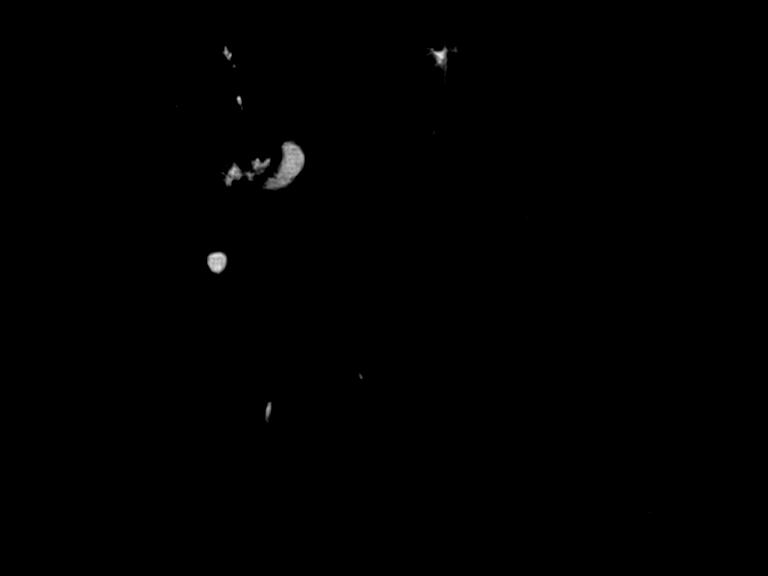
[im 72/72]
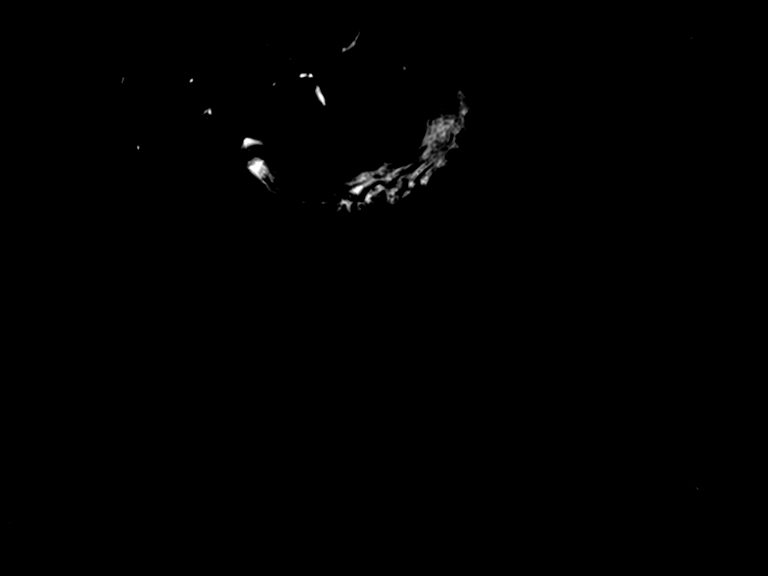

[Series 20: t2_space_cor_cs20_trig_384_iso_mip_radials · sagittal · 0.49mm/px · 1 of 18 slices shown]
[im 1/18]
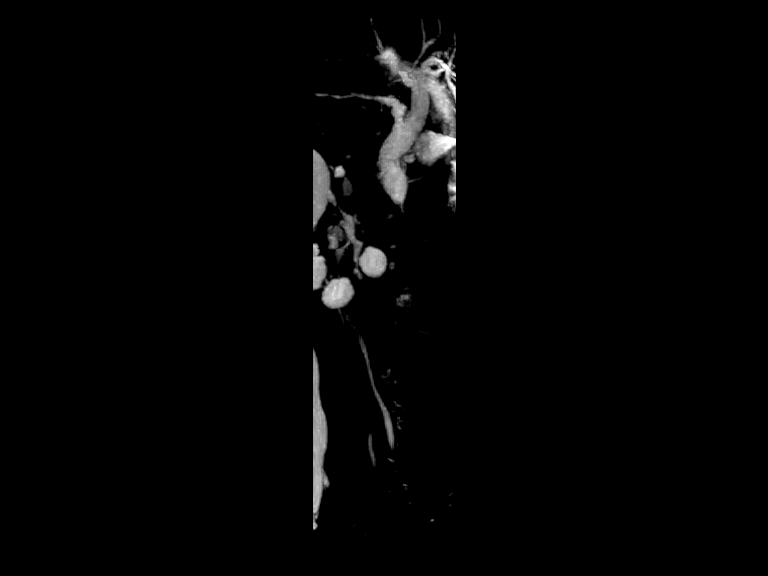

[Series 21: T1 dynamic fat-sat · axial · non-contrast · 3.0mm · 1.19mm/px · z∈[-62,+151]mm · 3 of 72 slices shown (1 of 4)]
[im 1/72]
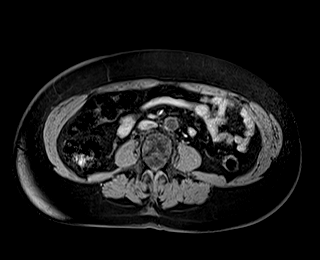
[im 36/72]
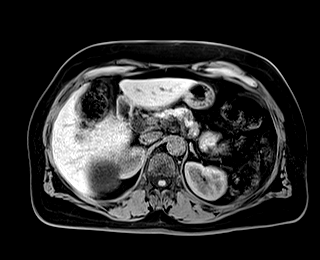
[im 72/72]
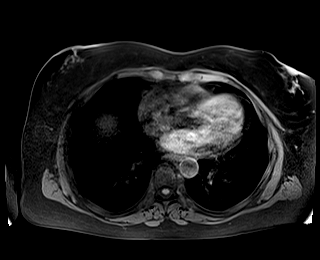

[Series 22: T1 dynamic fat-sat post-contrast · axial · 3.0mm · 1.19mm/px · z∈[-62,+151]mm · 3 of 72 slices shown (1 of 3)]
[im 1/72]
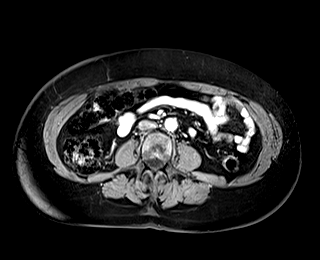
[im 36/72]
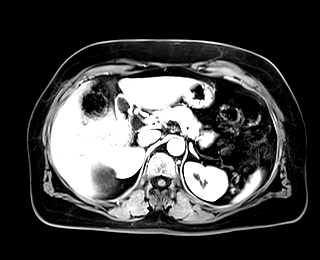
[im 72/72]
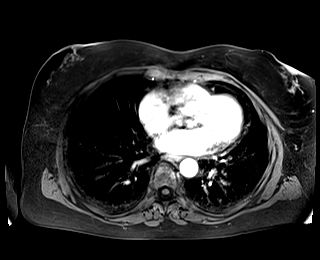

[Series 23: T1 dynamic fat-sat · axial · 3.0mm · 1.19mm/px · z∈[-62,+151]mm · 3 of 72 slices shown (2 of 4)]
[im 1/72]
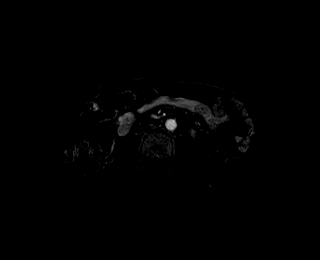
[im 36/72]
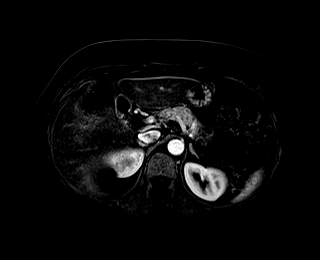
[im 72/72]
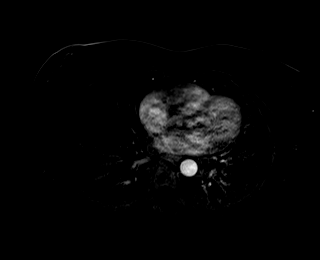

[Series 24: T1 dynamic fat-sat post-contrast · axial · 3.0mm · 1.19mm/px · z∈[-62,+151]mm · 3 of 72 slices shown (2 of 3)]
[im 1/72]
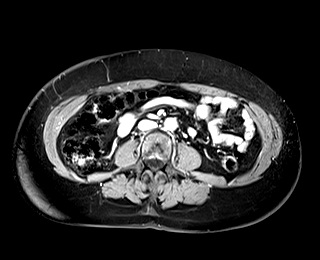
[im 36/72]
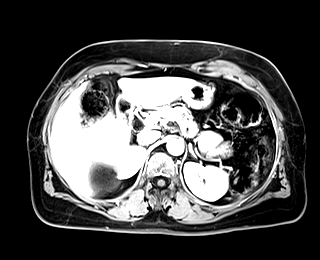
[im 72/72]
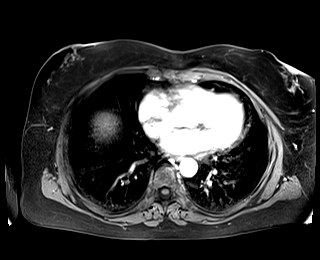

[Series 25: T1 dynamic fat-sat · axial · 3.0mm · 1.19mm/px · z∈[-62,+151]mm · 3 of 72 slices shown (3 of 4)]
[im 1/72]
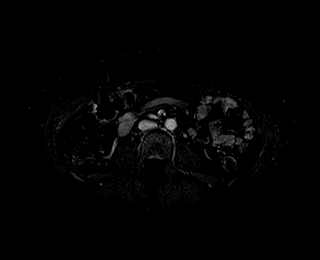
[im 36/72]
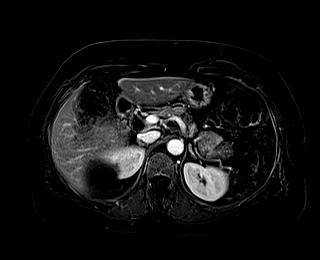
[im 72/72]
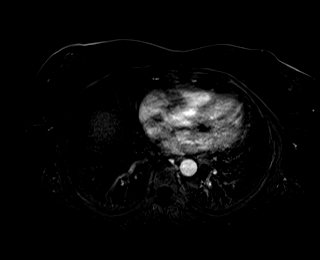

[Series 26: T1 dynamic fat-sat post-contrast · axial · 3.0mm · 1.19mm/px · z∈[-62,+151]mm · 3 of 72 slices shown (3 of 3)]
[im 1/72]
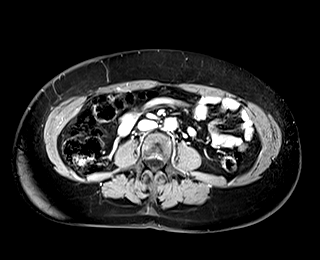
[im 36/72]
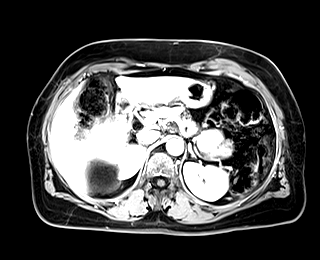
[im 72/72]
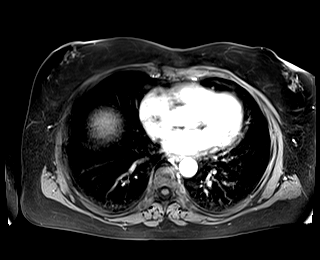

[Series 27: T1 dynamic fat-sat · axial · 3.0mm · 1.19mm/px · z∈[-62,+43]mm · 2 of 72 slices shown (4 of 4)]
[im 1/72]
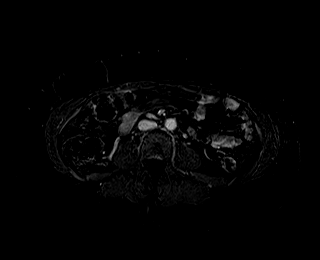
[im 36/72]
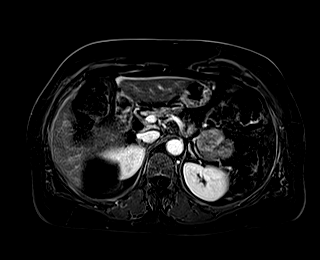

[38 of 48 positions shown; findings below may reference images not displayed]

FINDINGS: Lower chest: No acute abnormality.

Hepatobiliary: No hepatic steatosis.  No suspicious hepatic lesion.

Gallbladder is surgically absent. Dilation of the intra and extra
hepatic bile ducts to the level of the ampulla with the common duct
measuring up to 1.2 cm on image [DATE]. No peribiliary enhancement.
No intraluminal filling defect visualized. No masslike lesions
producing extrinsic compression visualized.

Pancreas: Normal intrinsic T1 signal throughout the pancreatic
parenchyma. No cystic or arterially enhancing pancreatic lesions
visualized. No pancreatic ductal dilation. No pancreatic divisum.

Spleen:  Within normal limits.

Adrenals/Urinary Tract: Bilateral adrenal glands are unremarkable.
No hydronephrosis. Left kidney is grossly unremarkable. There are
multiple T2 hyperintense right renal lesions measuring up to 5.4 cm
in the upper pole on image [DATE]. Some lesions demonstrate a few thin
internal septations for instance in the right lower pole measuring
18 cm on image [DATE]. No suspicious wall thickening, thick internal
septations or enhancing nodularity associated with these cystic
lesions visualized, consistent with Bosniak classification 1 and 2
renal cysts.

Stomach/Bowel: Visualized portions within the abdomen are
unremarkable.

Vascular/Lymphatic: No pathologically enlarged lymph nodes
identified. No abdominal aortic aneurysm demonstrated.

Other:  No abdominal ascites.

Musculoskeletal: No suspicious bone lesions identified.
IMPRESSION: 1. Dilation of the intra and extra hepatic bile ducts to the level
of the ampulla with the common duct measuring up to 1.2 cm, no
discrete obstructive etiology visualized. Findings likely reflecting
reservoir effect postcholecystectomy state.
2. Multiple benign Bosniak classification 1 and 2 cystic renal
lesions.

## 2022-10-07 ENCOUNTER — Other Ambulatory Visit: Payer: Self-pay

## 2022-10-07 MED ORDER — BUPROPION HCL ER (SR) 100 MG PO TB12: 100.0000 mg | ORAL_TABLET | Freq: Two times a day (BID) | ORAL | 1 refills | Status: DC

## 2022-10-16 ENCOUNTER — Ambulatory Visit: Payer: Medicare PPO | Admitting: Internal Medicine

## 2022-10-19 ENCOUNTER — Ambulatory Visit: Payer: Medicare PPO | Admitting: Internal Medicine

## 2022-10-19 ENCOUNTER — Encounter: Payer: Self-pay | Admitting: Internal Medicine

## 2022-10-19 VITALS — BP 118/76 | HR 74 | Temp 98.1°F | Ht 64.0 in | Wt 164.4 lb

## 2022-10-19 DIAGNOSIS — E782 Mixed hyperlipidemia: Secondary | ICD-10-CM | POA: Diagnosis not present

## 2022-10-19 DIAGNOSIS — F5104 Psychophysiologic insomnia: Secondary | ICD-10-CM | POA: Diagnosis not present

## 2022-10-19 DIAGNOSIS — R7301 Impaired fasting glucose: Secondary | ICD-10-CM

## 2022-10-19 DIAGNOSIS — I700 Atherosclerosis of aorta: Secondary | ICD-10-CM

## 2022-10-19 DIAGNOSIS — Z79899 Other long term (current) drug therapy: Secondary | ICD-10-CM

## 2022-10-19 DIAGNOSIS — I10 Essential (primary) hypertension: Secondary | ICD-10-CM | POA: Diagnosis not present

## 2022-10-19 DIAGNOSIS — F339 Major depressive disorder, recurrent, unspecified: Secondary | ICD-10-CM | POA: Diagnosis not present

## 2022-10-19 LAB — LIPID PANEL
Cholesterol: 171 mg/dL (ref 0–200)
HDL: 68 mg/dL (ref >39.00)
LDL Cholesterol: 68 mg/dL (ref 0–99)
NonHDL: 103.15
Total CHOL/HDL Ratio: 3
Triglycerides: 175 mg/dL — ABNORMAL HIGH (ref 0.0–149.0)
VLDL: 35 mg/dL (ref 0.0–40.0)

## 2022-10-19 LAB — CBC WITH DIFFERENTIAL/PLATELET
Basophils Absolute: 0.1 10*3/uL (ref 0.0–0.1)
Basophils Relative: 0.7 % (ref 0.0–3.0)
Eosinophils Absolute: 0.2 10*3/uL (ref 0.0–0.7)
Eosinophils Relative: 2.5 % (ref 0.0–5.0)
HCT: 42.9 % (ref 36.0–46.0)
Hemoglobin: 14.6 g/dL (ref 12.0–15.0)
Lymphocytes Relative: 21.4 % (ref 12.0–46.0)
Lymphs Abs: 1.9 10*3/uL (ref 0.7–4.0)
MCHC: 34 g/dL (ref 30.0–36.0)
MCV: 94.4 fl (ref 78.0–100.0)
Monocytes Absolute: 0.8 10*3/uL (ref 0.1–1.0)
Monocytes Relative: 8.9 % (ref 3.0–12.0)
Neutro Abs: 5.8 10*3/uL (ref 1.4–7.7)
Neutrophils Relative %: 66.5 % (ref 43.0–77.0)
Platelets: 328 10*3/uL (ref 150.0–400.0)
RBC: 4.55 Mil/uL (ref 3.87–5.11)
RDW: 12.3 % (ref 11.5–15.5)
WBC: 8.8 10*3/uL (ref 4.0–10.5)

## 2022-10-19 LAB — COMPREHENSIVE METABOLIC PANEL
ALT: 13 U/L (ref 0–35)
AST: 11 U/L (ref 0–37)
Albumin: 4.1 g/dL (ref 3.5–5.2)
Alkaline Phosphatase: 61 U/L (ref 39–117)
BUN: 17 mg/dL (ref 6–23)
CO2: 31 mEq/L (ref 19–32)
Calcium: 9.7 mg/dL (ref 8.4–10.5)
Chloride: 98 mEq/L (ref 96–112)
Creatinine, Ser: 0.84 mg/dL (ref 0.40–1.20)
GFR: 69.28 mL/min (ref >60.00)
Glucose, Bld: 107 mg/dL — ABNORMAL HIGH (ref 70–99)
Potassium: 3.9 mEq/L (ref 3.5–5.1)
Sodium: 137 mEq/L (ref 135–145)
Total Bilirubin: 0.5 mg/dL (ref 0.2–1.2)
Total Protein: 7.6 g/dL (ref 6.0–8.3)

## 2022-10-19 LAB — MICROALBUMIN / CREATININE URINE RATIO
Creatinine,U: 30.1 mg/dL
Microalb Creat Ratio: 2.3 mg/g (ref 0.0–30.0)
Microalb, Ur: <0.7 mg/dL (ref 0.0–1.9)

## 2022-10-19 LAB — LDL CHOLESTEROL, DIRECT: Direct LDL: 84 mg/dL

## 2022-10-19 LAB — TSH: TSH: 1.56 u[IU]/mL (ref 0.35–5.50)

## 2022-10-19 LAB — HEMOGLOBIN A1C: Hgb A1c MFr Bld: 6.3 % (ref 4.6–6.5)

## 2022-10-19 MED ORDER — DOXYCYCLINE HYCLATE 100 MG PO TABS: 100.0000 mg | ORAL_TABLET | Freq: Two times a day (BID) | ORAL | 0 refills | Status: DC

## 2022-10-19 MED ORDER — MONTELUKAST SODIUM 10 MG PO TABS
10.0000 mg | ORAL_TABLET | Freq: Every day | ORAL | 3 refills | Status: DC
Start: 2022-10-19 — End: 2023-02-19

## 2022-10-19 MED ORDER — LOSARTAN POTASSIUM 100 MG PO TABS: 100.0000 mg | ORAL_TABLET | Freq: Every day | ORAL | 1 refills | Status: DC

## 2022-10-19 NOTE — Assessment & Plan Note (Signed)
Reviewed findings of prior CT scan today with patient.  She is tolerating atorvastatin 20 mg daily  °

## 2022-10-19 NOTE — Assessment & Plan Note (Addendum)
Has been managed for over 5 years with wellbutrin.  No changes today

## 2022-10-19 NOTE — Assessment & Plan Note (Addendum)
Home readings have been < 120/70 on amlodipine 5 mg.  Losartan/hct 00/25.  Stopping hctz  today

## 2022-10-19 NOTE — Patient Instructions (Addendum)
Ok to use tylenol or motrin at bedtime if it manages your insomnia    You might want to try using Relaxium for insomnia  (as seen on TV commercials) . It contains:  Melatonin 5 mg  Chamomile 25 mg Passionflower extract 75 mg GABA 100 mg Ashwaganda extract 125 mg Magnesium citrate, glycinate, oxide (100 mg)  L tryptophan 500 mg Valerest (proprietary  ingredient ; probably valeria root extract)   Gradually increase water  intake to 60 ounces of water daily   Work on getting  30 minutes of exercise daily

## 2022-10-19 NOTE — Progress Notes (Signed)
Subjective:  Patient ID: Shelley Zuniga, female    DOB: November 13, 1950  Age: 72 y.o. MRN: [id number]  CC: The primary encounter diagnosis was Primary hypertension. Diagnoses of Impaired fasting glucose, Mixed hyperlipidemia, Long-term use of high-risk medication, Psychophysiological insomnia, Abdominal aortic atherosclerosis (HCC), and Major depressive disorder, recurrent episode with melancholic features [facility]) were also pertinent to this visit.   HPI Shelley Zuniga presents for  Chief Complaint  Patient presents with   Medical Management of Chronic Issues   1) HTN:   taking losartan hct and amlodipine  2) aortic atherosclerosis: taking atorvastatin   3) MDD:  taking wellbutrin   40 CC: INSOMNIA. Patient  has been having some trouble sleeping.  Wakes up 2 to 3 times per night . Bedtime hygiene reviewed,  Patient has been using an electronic book to read before bed.  Does not drink caffeinated beverages after 3 PM.  Only voids  bladder once per night.  No snoring partner. Does not drink alcohol to excess.  Not exercising excessively in the evening. Patient does have a history of anxiety but does not lie awake worrying about issues that cannot be resolved. Does not take stimulants. .    5) treated for septal cellulitis  in March ( presented with nose bleeding ) by Dr Jenne Campus with a  topical abx and compounded liquid  spray that was $80    Outpatient Medications Prior to Visit  Medication Sig Dispense Refill   Albuterol Sulfate (PROAIR RESPICLICK) 108 (90 Base) MCG/ACT AEPB Inhale 2 puffs into the lungs every 6 (six) hours as needed (shortness of breath or wheezing). 1 each 3   amLODipine (NORVASC) 5 MG tablet TAKE 1 TABLET(5 MG) BY MOUTH DAILY 90 tablet 1   aspirin 81 MG tablet Take 81 mg by mouth daily.     atorvastatin (LIPITOR) 20 MG tablet Take 1 tablet (20 mg total) by mouth daily. 90 tablet 3   BREO ELLIPTA 100-25 MCG/ACT AEPB INHALE 1 PUFF INTO THE LUNGS DAILY 60 each 6   buPROPion  ER (WELLBUTRIN SR) 100 MG 12 hr tablet Take 1 tablet (100 mg total) by mouth 2 (two) times daily. 180 tablet 1   Cranberry 500 MG CAPS Take 1 capsule by mouth daily.     Docusate Calcium (STOOL SOFTENER PO) Take 1 capsule by mouth daily.     estradiol (ESTRACE) 1 MG tablet Take 1 mg by mouth daily.     hydrocortisone cream 0.5 % Apply 1 application. topically 2 (two) times daily as needed for itching. 30 g 5   PREVIDENT 5000 DRY MOUTH 1.1 % GEL dental gel      VITAMIN D PO Take 400 mg by mouth daily.     vitamin E 400 UNIT capsule Take 400 Units by mouth daily.     losartan-hydrochlorothiazide (HYZAAR) 100-25 MG tablet Take 1 tablet by mouth daily. 90 tablet 3   ALPRAZolam (XANAX) 0.25 MG tablet Take 1 tablet (0.25 mg total) by mouth at bedtime as needed for anxiety. (Patient not taking: Reported on 10/19/2022) 30 tablet 5   fluocinonide cream (LIDEX) 0.05 % Apply 1 application. topically as needed. (Patient not taking: Reported on 10/19/2022)     fluticasone (FLONASE) 50 MCG/ACT nasal spray Place 1 spray into both nostrils daily. (Patient not taking: Reported on 10/19/2022) 48 g 1   predniSONE (DELTASONE) 10 MG tablet 6 tablets on Day 1 , then reduce by 1 tablet daily until gone (Patient not taking: Reported on  06/04/2022) 21 tablet 0   No facility-administered medications prior to visit.    Review of Systems;  Patient denies headache, fevers, malaise, unintentional weight loss, skin rash, eye pain, sinus congestion and sinus pain, sore throat, dysphagia,  hemoptysis , cough, dyspnea, wheezing, chest pain, palpitations, orthopnea, edema, abdominal pain, nausea, melena, diarrhea, constipation, flank pain, dysuria, hematuria, urinary  Frequency, nocturia, numbness, tingling, seizures,  Focal weakness, Loss of consciousness,  Tremor, insomnia, depression, anxiety, and suicidal ideation.      Objective:  BP 118/76   Pulse 74   Temp 98.1 F (36.7 C) (Oral)   Ht 5\' 4"  (1.626 m)   Wt 164 lb 6.4 oz  (74.6 kg)   SpO2 98%   BMI 28.22 kg/m   BP Readings from Last 3 Encounters:  10/19/22 118/76  06/04/22 130/82  04/16/22 128/76    Wt Readings from Last 3 Encounters:  10/19/22 164 lb 6.4 oz (74.6 kg)  07/31/22 162 lb (73.5 kg)  06/04/22 162 lb 9.6 oz (73.8 kg)    Physical Exam Vitals reviewed.  Constitutional:      General: She is not in acute distress.    Appearance: Normal appearance. She is well-developed and normal weight. She is not ill-appearing, toxic-appearing or diaphoretic.  HENT:     Head: Normocephalic.     Right Ear: Tympanic membrane, ear canal and external ear normal. There is no impacted cerumen.     Left Ear: Tympanic membrane, ear canal and external ear normal. There is no impacted cerumen.     Nose: Nose normal.     Mouth/Throat:     Mouth: Mucous membranes are moist.     Pharynx: Oropharynx is clear.  Eyes:     General: No scleral icterus.       Right eye: No discharge.        Left eye: No discharge.     Conjunctiva/sclera: Conjunctivae normal.     Pupils: Pupils are equal, round, and reactive to light.  Neck:     Thyroid: No thyromegaly.     Vascular: No carotid bruit or JVD.  Cardiovascular:     Rate and Rhythm: Normal rate and regular rhythm.     Heart sounds: Normal heart sounds.  Pulmonary:     Effort: Pulmonary effort is normal. No respiratory distress.     Breath sounds: Normal breath sounds.  Chest:  Breasts:    Breasts are symmetrical.     Right: Normal. No swelling, inverted nipple, mass, nipple discharge, skin change or tenderness.     Left: Normal. No swelling, inverted nipple, mass, nipple discharge, skin change or tenderness.  Abdominal:     General: Bowel sounds are normal.     Palpations: Abdomen is soft. There is no mass.     Tenderness: There is no abdominal tenderness. There is no guarding or rebound.  Musculoskeletal:        General: Normal range of motion.     Cervical back: Normal range of motion and neck supple.   Lymphadenopathy:     Cervical: No cervical adenopathy.     Upper Body:     Right upper body: No supraclavicular, axillary or pectoral adenopathy.     Left upper body: No supraclavicular, axillary or pectoral adenopathy.  Skin:    General: Skin is warm and dry.  Neurological:     General: No focal deficit present.     Mental Status: She is alert and oriented to person, place, and time. Mental status  is at baseline.  Psychiatric:        Mood and Affect: Mood normal.        Behavior: Behavior normal.        Thought Content: Thought content normal.        Judgment: Judgment normal.    Lab Results  Component Value Date   HGBA1C 6.3 10/19/2022   HGBA1C 6.0 07/04/2021   HGBA1C 6.1 03/14/2020    Lab Results  Component Value Date   CREATININE 0.84 10/19/2022   CREATININE 0.79 04/16/2022   CREATININE 0.75 07/04/2021    Lab Results  Component Value Date   WBC 8.8 10/19/2022   HGB 14.6 10/19/2022   HCT 42.9 10/19/2022   PLT 328.0 10/19/2022   GLUCOSE 107 (H) 10/19/2022   CHOL 171 10/19/2022   TRIG 175.0 (H) 10/19/2022   HDL 68.00 10/19/2022   LDLDIRECT 84.0 10/19/2022   LDLCALC 68 10/19/2022   ALT 13 10/19/2022   AST 11 10/19/2022   NA 137 10/19/2022   K 3.9 10/19/2022   CL 98 10/19/2022   CREATININE 0.84 10/19/2022   BUN 17 10/19/2022   CO2 31 10/19/2022   TSH 1.56 10/19/2022   HGBA1C 6.3 10/19/2022   MICROALBUR <0.7 10/19/2022    DG SWALLOW FUNC OP MEDICARE SPEECH PATH  Result Date: 01/15/2022 CLINICAL DATA:  Dysphagia. Cough/GE reflux disease/other secondary diagnosis EXAM: MODIFIED BARIUM SWALLOW TECHNIQUE: Different consistencies of barium were administered orally to the patient by the Speech Pathologist. Imaging of the pharynx was performed in the lateral projection. The radiologist was present in the fluoroscopy room for this study, providing personal supervision. FLUOROSCOPY: Radiation Exposure Index (as provided by the fluoroscopic device): 1.48 mGy Kerma  COMPARISON:  None Available. FINDINGS: Vestibular  Penetration:  None seen. Aspiration:  None seen. Other:  None. IMPRESSION: See speech pathology report. Radiologist interpretation not provided. Please refer to the Speech Pathologists report for complete details and recommendations. Electronically Signed   By: Genevive Bi M.D.   On: 01/15/2022 13:35    Assessment & Plan:  .Primary hypertension Assessment & Plan: Home readings have been < 120/70 on amlodipine 5 mg.  Losartan/hct 00/25.  Stopping hctz  today  Orders: -     Comprehensive metabolic panel -     Microalbumin / creatinine urine ratio  Impaired fasting glucose -     Comprehensive metabolic panel -     Hemoglobin A1c  Mixed hyperlipidemia -     Lipid panel -     LDL cholesterol, direct  Long-term use of high-risk medication -     TSH -     CBC with Differential/Platelet  Psychophysiological insomnia Assessment & Plan: DID NOT tolerate lowest dose of alprazolam.  Using plain tylenol/advil . Recommend trial of Relaxium    Abdominal aortic atherosclerosis (HCC) Assessment & Plan: Reviewed findings of prior CT scan today with patient.  She is tolerating atorvastatin 20 mg daily    Major depressive disorder, recurrent episode with melancholic features [facility]) Assessment & Plan: Has been managed for over 5 years with wellbutrin.  No changes today    Other orders -     Doxycycline Hyclate; Take 1 tablet (100 mg total) by mouth 2 (two) times daily.  Dispense: 14 tablet; Refill: 0 -     Losartan Potassium; Take 1 tablet (100 mg total) by mouth daily.  Dispense: 90 tablet; Refill: 1 -     Montelukast Sodium; Take 1 tablet (10 mg total) by mouth at bedtime.  Dispense: 30 tablet; Refill: 3     I provided  43 minutes of face-to-face time during this encounter reviewing patient's last visit with me, patient's  most recent visit with pulmonology,  recent surgical and non surgical procedures, previous  labs and imaging  studies, counseling on currently addressed issues,  and post visit ordering to diagnostics and therapeutics .   Follow-up: No follow-ups on file.   Sherlene Shams, MD

## 2022-10-19 NOTE — Assessment & Plan Note (Addendum)
DID NOT tolerate lowest dose of alprazolam.  Using plain tylenol/advil . Recommend trial of Relaxium

## 2022-10-22 ENCOUNTER — Encounter: Payer: Self-pay | Admitting: Pulmonary Disease

## 2022-11-02 ENCOUNTER — Ambulatory Visit (INDEPENDENT_AMBULATORY_CARE_PROVIDER_SITE_OTHER): Payer: Medicare PPO

## 2022-11-02 DIAGNOSIS — E538 Deficiency of other specified B group vitamins: Secondary | ICD-10-CM | POA: Diagnosis not present

## 2022-11-02 MED ORDER — CYANOCOBALAMIN 1000 MCG/ML IJ SOLN
1000.0000 ug | Freq: Once | INTRAMUSCULAR | Status: AC
Start: 2022-11-02 — End: 2022-11-02
  Administered 2022-11-02: 1000 ug via INTRAMUSCULAR

## 2022-11-02 NOTE — Progress Notes (Signed)
Pt presented for their vitamin B12 injection. Pt was identified through two identifiers. Pt tolerated shot well in their left  deltoid.  

## 2022-11-18 DIAGNOSIS — H6121 Impacted cerumen, right ear: Secondary | ICD-10-CM | POA: Diagnosis not present

## 2022-11-18 DIAGNOSIS — H90A22 Sensorineural hearing loss, unilateral, left ear, with restricted hearing on the contralateral side: Secondary | ICD-10-CM | POA: Diagnosis not present

## 2022-11-30 DIAGNOSIS — H90A22 Sensorineural hearing loss, unilateral, left ear, with restricted hearing on the contralateral side: Secondary | ICD-10-CM | POA: Diagnosis not present

## 2022-11-30 DIAGNOSIS — H903 Sensorineural hearing loss, bilateral: Secondary | ICD-10-CM | POA: Diagnosis not present

## 2022-12-03 ENCOUNTER — Ambulatory Visit: Payer: Medicare PPO

## 2022-12-03 ENCOUNTER — Encounter: Payer: Self-pay | Admitting: Internal Medicine

## 2022-12-04 ENCOUNTER — Ambulatory Visit (INDEPENDENT_AMBULATORY_CARE_PROVIDER_SITE_OTHER): Payer: Medicare PPO

## 2022-12-04 DIAGNOSIS — E538 Deficiency of other specified B group vitamins: Secondary | ICD-10-CM | POA: Diagnosis not present

## 2022-12-04 MED ORDER — CYANOCOBALAMIN 1000 MCG/ML IJ SOLN
1000.0000 ug | Freq: Once | INTRAMUSCULAR | Status: AC
Start: 2022-12-04 — End: 2022-12-04
  Administered 2022-12-04: 1000 ug via INTRAMUSCULAR

## 2022-12-04 NOTE — Progress Notes (Signed)
Patient presented for B 12 injection to right deltoid, patient voiced no concerns nor showed any signs of distress during injection. 

## 2022-12-04 NOTE — Telephone Encounter (Signed)
Spoke with pt and scheduled her for an appt with Dr. Darrick Huntsman on Monday.

## 2022-12-07 ENCOUNTER — Ambulatory Visit: Payer: Medicare PPO | Admitting: Internal Medicine

## 2022-12-07 ENCOUNTER — Encounter: Payer: Self-pay | Admitting: Internal Medicine

## 2022-12-07 VITALS — BP 144/76 | HR 77 | Ht 64.0 in | Wt 165.6 lb

## 2022-12-07 DIAGNOSIS — I10 Essential (primary) hypertension: Secondary | ICD-10-CM

## 2022-12-07 MED ORDER — AMLODIPINE BESYLATE 5 MG PO TABS: ORAL_TABLET | ORAL | 1 refills | Status: DC

## 2022-12-07 NOTE — Progress Notes (Unsigned)
Subjective:  Patient ID: Shelley Zuniga, female    DOB: [birth year]  Age: 72 y.o. MRN: [id number]  CC: {There were no encounter diagnoses. (Refresh or delete this SmartLink)}   HPI Shelley Zuniga presents for  Chief Complaint  Patient presents with   bilateral ankle swelling   Shelley Zuniga has hypertension treated until June with losartan/hct and amlodipine  At last visit in June she was experiencing signs of dehydration (dry eye,  dry mouth, BP 120 or less) and hydrochlorothiazide was discontinued.  However 6 days ago she developed symptomatic ankle edema aggravatted by 3 days of sitting in the bleachers watching her grandson play baseball.  The edema was still present by morning.  The skin aroudn her ankles became tender and irritated, and she noted a weight gain of 3 lbs without calf pain , dyspnea, or orthopnea.  She resumed hydrochlorothiazide 3 days ago and has dropped 3 lbs,  ankle swelling and pain has improved ,  and BP today was 110/73 at home    Outpatient Medications Prior to Visit  Medication Sig Dispense Refill   Albuterol Sulfate (PROAIR RESPICLICK) 108 (90 Base) MCG/ACT AEPB Inhale 2 puffs into the lungs every 6 (six) hours as needed (shortness of breath or wheezing). 1 each 3   amLODipine (NORVASC) 5 MG tablet TAKE 1 TABLET(5 MG) BY MOUTH DAILY 90 tablet 1   aspirin 81 MG tablet Take 81 mg by mouth daily.     atorvastatin (LIPITOR) 20 MG tablet Take 1 tablet (20 mg total) by mouth daily. 90 tablet 3   BREO ELLIPTA 100-25 MCG/ACT AEPB INHALE 1 PUFF INTO THE LUNGS DAILY 60 each 6   buPROPion ER (WELLBUTRIN SR) 100 MG 12 hr tablet Take 1 tablet (100 mg total) by mouth 2 (two) times daily. 180 tablet 1   Cranberry 500 MG CAPS Take 1 capsule by mouth daily.     Docusate Calcium (STOOL SOFTENER PO) Take 1 capsule by mouth daily.     estradiol (ESTRACE) 1 MG tablet Take 1 mg by mouth daily.     hydrocortisone cream 0.5 % Apply 1 application. topically 2 (two) times daily as needed for  itching. 30 g 5   losartan-hydrochlorothiazide (HYZAAR) 100-25 MG tablet Take 1 tablet by mouth daily.     montelukast (SINGULAIR) 10 MG tablet Take 1 tablet (10 mg total) by mouth at bedtime. 30 tablet 3   PREVIDENT 5000 DRY MOUTH 1.1 % GEL dental gel      VITAMIN D PO Take 400 mg by mouth daily.     vitamin E 400 UNIT capsule Take 400 Units by mouth daily.     losartan (COZAAR) 100 MG tablet Take 1 tablet (100 mg total) by mouth daily. (Patient not taking: Reported on 12/07/2022) 90 tablet 1   doxycycline (VIBRA-TABS) 100 MG tablet Take 1 tablet (100 mg total) by mouth 2 (two) times daily. 14 tablet 0   No facility-administered medications prior to visit.    Review of Systems;  Patient denies headache, fevers, malaise, unintentional weight loss, skin rash, eye pain, sinus congestion and sinus pain, sore throat, dysphagia,  hemoptysis , cough, dyspnea, wheezing, chest pain, palpitations, orthopnea, edema, abdominal pain, nausea, melena, diarrhea, constipation, flank pain, dysuria, hematuria, urinary  Frequency, nocturia, numbness, tingling, seizures,  Focal weakness, Loss of consciousness,  Tremor, insomnia, depression, anxiety, and suicidal ideation.      Objective:  BP (!) 144/76   Pulse 77   Ht 5\' 4"  (1.626 m)  Wt 165 lb 9.6 oz (75.1 kg)   SpO2 96%   BMI 28.43 kg/m   BP Readings from Last 3 Encounters:  12/07/22 (!) 144/76  10/19/22 118/76  06/04/22 130/82    Wt Readings from Last 3 Encounters:  12/07/22 165 lb 9.6 oz (75.1 kg)  10/19/22 164 lb 6.4 oz (74.6 kg)  07/31/22 162 lb (73.5 kg)    Physical Exam  Lab Results  Component Value Date   HGBA1C 6.3 10/19/2022   HGBA1C 6.0 07/04/2021   HGBA1C 6.1 03/14/2020    Lab Results  Component Value Date   CREATININE 0.84 10/19/2022   CREATININE 0.79 04/16/2022   CREATININE 0.75 07/04/2021    Lab Results  Component Value Date   WBC 8.8 10/19/2022   HGB 14.6 10/19/2022   HCT 42.9 10/19/2022   PLT 328.0 10/19/2022    GLUCOSE 107 (H) 10/19/2022   CHOL 171 10/19/2022   TRIG 175.0 (H) 10/19/2022   HDL 68.00 10/19/2022   LDLDIRECT 84.0 10/19/2022   LDLCALC 68 10/19/2022   ALT 13 10/19/2022   AST 11 10/19/2022   NA 137 10/19/2022   K 3.9 10/19/2022   CL 98 10/19/2022   CREATININE 0.84 10/19/2022   BUN 17 10/19/2022   CO2 31 10/19/2022   TSH 1.56 10/19/2022   HGBA1C 6.3 10/19/2022   MICROALBUR <0.7 10/19/2022    DG SWALLOW FUNC OP MEDICARE SPEECH PATH  Result Date: 01/15/2022 CLINICAL DATA:  Dysphagia. Cough/GE reflux disease/other secondary diagnosis EXAM: MODIFIED BARIUM SWALLOW TECHNIQUE: Different consistencies of barium were administered orally to the patient by the Speech Pathologist. Imaging of the pharynx was performed in the lateral projection. The radiologist was present in the fluoroscopy room for this study, providing personal supervision. FLUOROSCOPY: Radiation Exposure Index (as provided by the fluoroscopic device): 1.48 mGy Kerma COMPARISON:  None Available. FINDINGS: Vestibular  Penetration:  None seen. Aspiration:  None seen. Other:  None. IMPRESSION: See speech pathology report. Radiologist interpretation not provided. Please refer to the Speech Pathologists report for complete details and recommendations. Electronically Signed   By: Shelley Zuniga M.D.   On: 01/15/2022 13:35    Assessment & Plan:  .There are no diagnoses linked to this encounter.   I provided 30 minutes of face-to-face time during this encounter reviewing patient's last visit with me, patient's  most recent visit with cardiology,  nephrology,  and neurology,  recent surgical and non surgical procedures, previous  labs and imaging studies, counseling on currently addressed issues,  and post visit ordering to diagnostics and therapeutics .   Follow-up: No follow-ups on file.   Shelley Shams, MD

## 2022-12-07 NOTE — Patient Instructions (Addendum)
Continue current regimen of losartan hct and amlodipine 5 mg   Send me more readings in one week , along with any new symptoms

## 2022-12-07 NOTE — Assessment & Plan Note (Signed)
Home readings have been < 120/70 on amlodipine 5 mg.  Losartan/hct 100/25.  Stopped  hctz   in June  but developed bilateral ankle edema 5 days ago,  so she resumed losartan hct today.  Marland Kitchen  Hoem readings  have only been done  ON CURRENT regimen  of the  PAST 3 DAYS

## 2022-12-16 ENCOUNTER — Encounter (INDEPENDENT_AMBULATORY_CARE_PROVIDER_SITE_OTHER): Payer: Self-pay

## 2022-12-20 ENCOUNTER — Encounter: Payer: Self-pay | Admitting: Internal Medicine

## 2022-12-30 DIAGNOSIS — J340 Abscess, furuncle and carbuncle of nose: Secondary | ICD-10-CM | POA: Diagnosis not present

## 2023-01-04 ENCOUNTER — Ambulatory Visit (INDEPENDENT_AMBULATORY_CARE_PROVIDER_SITE_OTHER): Payer: Medicare PPO

## 2023-01-04 ENCOUNTER — Encounter: Payer: Self-pay | Admitting: Pulmonary Disease

## 2023-01-04 ENCOUNTER — Ambulatory Visit: Payer: Medicare PPO | Admitting: Pulmonary Disease

## 2023-01-04 VITALS — BP 122/82 | HR 80 | Temp 97.3°F | Ht 64.0 in | Wt 166.8 lb

## 2023-01-04 DIAGNOSIS — J4540 Moderate persistent asthma, uncomplicated: Secondary | ICD-10-CM

## 2023-01-04 DIAGNOSIS — M19042 Primary osteoarthritis, left hand: Secondary | ICD-10-CM

## 2023-01-04 DIAGNOSIS — R053 Chronic cough: Secondary | ICD-10-CM | POA: Diagnosis not present

## 2023-01-04 DIAGNOSIS — E538 Deficiency of other specified B group vitamins: Secondary | ICD-10-CM | POA: Diagnosis not present

## 2023-01-04 DIAGNOSIS — M19041 Primary osteoarthritis, right hand: Secondary | ICD-10-CM

## 2023-01-04 LAB — NITRIC OXIDE: Nitric Oxide: 21

## 2023-01-04 MED ORDER — ALBUTEROL SULFATE HFA 108 (90 BASE) MCG/ACT IN AERS: 2.0000 | INHALATION_SPRAY | Freq: Four times a day (QID) | RESPIRATORY_TRACT | 2 refills | Status: AC | PRN

## 2023-01-04 MED ORDER — CYANOCOBALAMIN 1000 MCG/ML IJ SOLN
1000.0000 ug | Freq: Once | INTRAMUSCULAR | Status: AC
Start: 2023-01-04 — End: 2023-01-04
  Administered 2023-01-04: 1000 ug via INTRAMUSCULAR

## 2023-01-04 NOTE — Progress Notes (Signed)
Subjective:    Patient ID: Shelley Zuniga, female    DOB: 1951-04-15, 71 y.o.   MRN: [id number]  Patient Care Team: Sherlene Shams, MD as PCP - General (Internal Medicine) Salena Saner, MD as Consulting Physician (Pulmonary Disease)  Chief Complaint  Patient presents with   Follow-up    Shortness of breath with exertion and heat. No wheezing. Dry cough when she drinks.   HPI Shelley Zuniga is a 72 year old lifelong never smoker who presents here for the follow-up on the issue of dyspnea in the setting of moderate persistent asthma.  She was last seen on 04 June 2022.  She had pulmonary function testing performed on that same day.  She was doing well on Breo Ellipta 100/25.  We prescribed ProAir Respiclick as a rescue due to her issues with OA in both hands and difficulty coordinating MDI use.  However, her insurance company would not cover respite click so she is using regular albuterol MDI as needed.  She does not have rescue inhaler at present.  In June, she reported to Dr. Darrick Huntsman, her primary care physician, that she was having some nocturnal awakenings with shortness of breath.  She was started on montelukast at nighttime and this has helped the symptoms.  Her only difficulty with shortness of breath is when she goes out in the extreme heat and humidity.  Otherwise she does not have difficulties.  She has no difficulties with activities of daily living or daily household chores.  Overall she feels that the Virgel Bouquet is controlling her symptoms well. She has not had any fevers, chills or sweats.  No cough or sputum production.  No chest pain, no orthopnea or paroxysmal nocturnal dyspnea no lower extremity edema nor calf tenderness.  Overall she feels well and looks well.  DATA 07/15/2021 PFTs: FEV1 1.68 L or 75% predicted, FVC 2.96 L or 106% predicted, FEV1/FVC 83%, significant bronchodilator response with a net change of 21% and FEV1 diffusion capacity normal, lung volumes normal, consistent with  moderate obstructive airways disease with significant bronchodilator response (asthmatic type). 06/04/2022 PFTs: FEV1 1.62 L or 105% predicted, FVC 2.23 L or 107% predicted, FEV1/FVC 66%, postbronchodilator her lung volumes normalized.  Normal diffusion capacity.  Consistent with mild obstructive defect asthmatic type.  Review of Systems A 10 point review of systems was performed and it is as noted above otherwise negative.   Patient Active Problem List   Diagnosis Date Noted   Chronic cough 04/18/2022   Exertional dyspnea 06/27/2021   RUQ abdominal pain 10/22/2020   Pulmonary nodules 10/22/2020   Abdominal aortic atherosclerosis (HCC) 10/21/2020   Muscle spasm 09/16/2020   Personal history of COVID-19 06/11/2020   Osteoarthritis of thumb, left 12/18/2019   Polyneuropathy in other diseases classified elsewhere (HCC) 12/18/2019   Insomnia 01/07/2019   Sciatica of left side associated with disorder of lumbar spine 11/21/2016   Impaired fasting glucose 05/15/2016   Major depressive disorder, recurrent episode with melancholic features (HCC) 11/15/2015   Shingles outbreak 10/01/2014   B12 deficiency 10/20/2013   Vitamin D deficiency 10/20/2013   Encounter for preventive health examination 09/19/2012   Screening for osteoporosis 04/14/2011   Mixed hyperlipidemia 04/14/2011   Screening for cervical cancer 04/13/2011   Screening for breast cancer 04/13/2011   Screening for colon cancer 04/13/2011   Hypertension 03/27/2011    Social History   Tobacco Use   Smoking status: Never   Smokeless tobacco: Never  Substance Use Topics   Alcohol use: No  Allergies  Allergen Reactions   Morphine And Codeine Itching   Sulfa Antibiotics Swelling    Tongue    Current Meds  Medication Sig   amLODipine (NORVASC) 5 MG tablet TAKE 1 TABLET(5 MG) BY MOUTH DAILY   aspirin 81 MG tablet Take 81 mg by mouth daily.   atorvastatin (LIPITOR) 20 MG tablet Take 1 tablet (20 mg total) by mouth  daily.   BREO ELLIPTA 100-25 MCG/ACT AEPB INHALE 1 PUFF INTO THE LUNGS DAILY   buPROPion ER (WELLBUTRIN SR) 100 MG 12 hr tablet Take 1 tablet (100 mg total) by mouth 2 (two) times daily.   Cranberry 500 MG CAPS Take 1 capsule by mouth daily.   Docusate Calcium (STOOL SOFTENER PO) Take 1 capsule by mouth daily.   estradiol (ESTRACE) 1 MG tablet Take 1 mg by mouth daily.   hydrocortisone cream 0.5 % Apply 1 application. topically 2 (two) times daily as needed for itching.   losartan-hydrochlorothiazide (HYZAAR) 100-25 MG tablet Take 1 tablet by mouth daily.   montelukast (SINGULAIR) 10 MG tablet Take 1 tablet (10 mg total) by mouth at bedtime.   PREVIDENT 5000 DRY MOUTH 1.1 % GEL dental gel    VITAMIN D PO Take 400 mg by mouth daily.   vitamin E 400 UNIT capsule Take 400 Units by mouth daily.    Immunization History  Administered Date(s) Administered   Fluad Quad(high Dose 65+) 03/19/2020, 02/14/2021   Influenza Split 03/26/2012   Influenza, High Dose Seasonal PF 02/22/2019, 03/13/2022   Influenza,inj,Quad PF,6+ Mos 02/22/2013, 02/10/2014   Influenza-Unspecified 04/04/2016, 03/13/2017, 01/31/2018   PFIZER Comirnaty(Gray Top)Covid-19 Tri-Sucrose Vaccine 05/25/2019, 06/15/2019   PFIZER(Purple Top)SARS-COV-2 Vaccination 05/25/2019, 06/15/2019, 10/05/2019, 02/12/2020   Pneumococcal Conjugate-13 05/15/2016   Pneumococcal Polysaccharide-23 03/14/2020   Rsv, Bivalent, Protein Subunit Rsvpref,pf Verdis Frederickson) 05/27/2022   Td 12/08/2018   Tdap 01/20/2010   Zoster, Live 10/19/2013      Objective:     BP 122/82 (BP Location: Left Arm, Cuff Size: Normal)   Pulse 80   Temp (!) 97.3 F (36.3 C)   Ht 5\' 4"  (1.626 m)   Wt 166 lb 12.8 oz (75.7 kg)   SpO2 95%   BMI 28.63 kg/m   SpO2: 95 % O2 Device: None (Room air)  GENERAL: Overweight woman, no acute distress, fully ambulatory.  No conversational dyspnea. HEAD: Normocephalic, atraumatic.  EYES: Pupils equal, round, reactive to light. No  scleral icterus.  MOUTH: Dentition intact, oral mucosa moist.  No thrush. NECK: Supple. No thyromegaly. Trachea midline. No JVD. No adenopathy. PULMONARY: Good air entry bilaterally. No adventitious sounds. CARDIOVASCULAR: S1 and S2. Regular rate and rhythm.  No rubs, murmurs or gallops heard. ABDOMEN: Benign. MUSCULOSKELETAL: Mild OA changes both hands, no clubbing, no edema.  NEUROLOGIC: No overt focal deficit, no gait disturbance, speech is fluent. SKIN: Intact,warm,dry. PSYCH: Mood and behavior normal   Lab Results  Component Value Date   NITRICOXIDE 21 01/04/2023          Assessment & Plan:     ICD-10-CM   1. Moderate persistent asthma without complication  J45.40 Nitric oxide   Appears to be well compensated at present Continue Breo Ellipta and montelukast PRN albuterol No prescription for albuterol sent    2. Osteoarthritis of both hands, unspecified osteoarthritis type  M19.041    M19.042    Limits mobility of hands Challenges use of MDI Adds complexity to her management      Orders Placed This Encounter  Procedures  Nitric oxide   Meds ordered this encounter  Medications   albuterol (VENTOLIN HFA) 108 (90 Base) MCG/ACT inhaler    Sig: Inhale 2 puffs into the lungs every 6 (six) hours as needed for wheezing or shortness of breath.    Dispense:  17 each    Refill:  2   Overall Shelley Zuniga is doing well.  Continue her current regimen.  She is to let us know if she continues to have issues with use of MDI.  Albuterol rescue inhaler prescription sent to pharmacy.  We will see her in follow-up in 6 months time she is to contact us prior to that time should any new difficulties arise.   Gailen Shelter, MD Advanced Bronchoscopy P[facility]    *This note was dictated using voice recognition software/Dragon.  Despite best efforts to proofread, errors can occur which can change the meaning. Any transcriptional errors that result from this  process are unintentional and may not be fully corrected at the time of dictation.

## 2023-01-04 NOTE — Progress Notes (Signed)
Patient presented for B 12 injection to left deltoid, patient voiced no concerns nor showed any signs of distress during injection. 

## 2023-01-04 NOTE — Patient Instructions (Signed)
Your lungs sounded clear today and your level of inflammation in the airway was low.  Continue Breo as you are doing.  Continue the montelukast that Dr. Darrick Huntsman prescribed for you.  We sent a prescription for a new rescue inhaler.  We will see you in follow-up in 6 months time call sooner should any new problems arise.

## 2023-02-04 ENCOUNTER — Telehealth: Payer: Self-pay

## 2023-02-04 ENCOUNTER — Ambulatory Visit: Payer: Medicare PPO

## 2023-02-04 NOTE — Telephone Encounter (Signed)
LMTCB. Pt is overdue for mammogram. Need to find out if pt is still getting them done.

## 2023-02-08 ENCOUNTER — Ambulatory Visit (INDEPENDENT_AMBULATORY_CARE_PROVIDER_SITE_OTHER): Payer: Medicare PPO

## 2023-02-08 DIAGNOSIS — E538 Deficiency of other specified B group vitamins: Secondary | ICD-10-CM

## 2023-02-08 MED ORDER — CYANOCOBALAMIN 1000 MCG/ML IJ SOLN
1000.0000 ug | Freq: Once | INTRAMUSCULAR | Status: AC
Start: 2023-02-08 — End: 2023-02-08
  Administered 2023-02-08: 1000 ug via INTRAMUSCULAR

## 2023-02-08 NOTE — Progress Notes (Signed)
Pt presented for their vitamin B12 injection. Pt was identified through two identifiers. Pt tolerated shot well in their right deltoid.

## 2023-02-19 ENCOUNTER — Encounter: Payer: Self-pay | Admitting: Internal Medicine

## 2023-02-19 ENCOUNTER — Other Ambulatory Visit: Payer: Self-pay

## 2023-02-19 MED ORDER — MONTELUKAST SODIUM 10 MG PO TABS: 10.0000 mg | ORAL_TABLET | Freq: Every day | ORAL | 1 refills | Status: DC

## 2023-03-02 DIAGNOSIS — M19049 Primary osteoarthritis, unspecified hand: Secondary | ICD-10-CM | POA: Diagnosis not present

## 2023-03-08 ENCOUNTER — Ambulatory Visit (INDEPENDENT_AMBULATORY_CARE_PROVIDER_SITE_OTHER): Payer: Medicare PPO

## 2023-03-08 DIAGNOSIS — E538 Deficiency of other specified B group vitamins: Secondary | ICD-10-CM

## 2023-03-08 DIAGNOSIS — Z23 Encounter for immunization: Secondary | ICD-10-CM | POA: Diagnosis not present

## 2023-03-08 MED ORDER — CYANOCOBALAMIN 1000 MCG/ML IJ SOLN
1000.0000 ug | Freq: Once | INTRAMUSCULAR | Status: AC
Start: 2023-03-08 — End: 2023-03-08
  Administered 2023-03-08: 1000 ug via INTRAMUSCULAR

## 2023-03-08 NOTE — Progress Notes (Signed)
Pt presented for their vitamin B12 injection. Pt was identified through two identifiers. Pt tolerated shot well in their left deltoid.   Pt also wanted the flu vaccine. Due to pt being 65+ High dose flu was offered to pt. Pt tolerated injection well in the right deltoid.

## 2023-03-09 ENCOUNTER — Ambulatory Visit: Payer: Medicare PPO | Attending: Rheumatology | Admitting: Occupational Therapy

## 2023-03-09 ENCOUNTER — Encounter: Payer: Self-pay | Admitting: Occupational Therapy

## 2023-03-09 DIAGNOSIS — M25541 Pain in joints of right hand: Secondary | ICD-10-CM | POA: Diagnosis not present

## 2023-03-09 DIAGNOSIS — M6281 Muscle weakness (generalized): Secondary | ICD-10-CM | POA: Diagnosis not present

## 2023-03-09 DIAGNOSIS — M25641 Stiffness of right hand, not elsewhere classified: Secondary | ICD-10-CM | POA: Insufficient documentation

## 2023-03-09 NOTE — Therapy (Signed)
OUTPATIENT OCCUPATIONAL THERAPY ORTHO EVALUATION  Patient Name: Shelley Zuniga MRN: [id number] DOB:[birth year], 72 y.o., female Today's Date: 03/09/2023  PCP: DR Darrick Huntsman REFERRING PROVIDER: DR Allena Katz  END OF SESSION:  OT End of Session - 03/09/23 2034     Visit Number 1    Number of Visits 4    Date for OT Re-Evaluation 04/20/23    OT Start Time 0950    OT Stop Time 1035    OT Time Calculation (min) 45 min    Activity Tolerance Patient tolerated treatment well    Behavior During Therapy [facility] for tasks assessed/performed             Past Medical History:  Diagnosis Date   B12 neuropathy (HCC) 10/20/2013   Colon polyp 2001   colonoscopy, [facility]    Headache    regularly, not migraines   Heart murmur    Hypertension    Motion sickness    PONV (postoperative nausea and vomiting)    Strabismus age 33   eye surgery   Tick bite of abdominal wall 10/22/2020   Wears hearing aid in both ears    Past Surgical History:  Procedure Laterality Date   ABDOMINAL HYSTERECTOMY  2000   [place], [facility], fibroids/menorrhagia   BASAL CELL CARCINOMA EXCISION Left 2013   Dr. Orson Aloe   BILATERAL OOPHORECTOMY  2000   fibroid cysts   CATARACT EXTRACTION W/PHACO Left 07/11/2019   Procedure: CATARACT EXTRACTION PHACO AND INTRAOCULAR LENS PLACEMENT (IOC) LEFT 8.41 00:49.8 ;  Surgeon: Galen Manila, MD;  Location: [facility];  Service: Ophthalmology;  Laterality: Left;   CATARACT EXTRACTION W/PHACO Right 07/25/2019   Procedure: CATARACT EXTRACTION PHACO AND INTRAOCULAR LENS PLACEMENT (IOC) RIGHT TORIC LENS;  Surgeon: Galen Manila, MD;  Location: [facility];  Service: Ophthalmology;  Laterality: Right;  CDE 4.84 U/S 0:35.7   EYE SURGERY  1956   to correct strabismus   GALLBLADDER SURGERY     HEMORRHOID SURGERY     Patient Active Problem List   Diagnosis Date Noted   Chronic cough 04/18/2022   Exertional dyspnea 06/27/2021   RUQ abdominal pain 10/22/2020   Pulmonary  nodules 10/22/2020   Abdominal aortic atherosclerosis (HCC) 10/21/2020   Muscle spasm 09/16/2020   Personal history of COVID-19 06/11/2020   Osteoarthritis of thumb, left 12/18/2019   Polyneuropathy in other diseases classified elsewhere (HCC) 12/18/2019   Insomnia 01/07/2019   Sciatica of left side associated with disorder of lumbar spine 11/21/2016   Impaired fasting glucose 05/15/2016   Major depressive disorder, recurrent episode with melancholic features (HCC) 11/15/2015   Shingles outbreak 10/01/2014   B12 deficiency 10/20/2013   Vitamin D deficiency 10/20/2013   Encounter for preventive health examination 09/19/2012   Screening for osteoporosis 04/14/2011   Mixed hyperlipidemia 04/14/2011   Screening for cervical cancer 04/13/2011   Screening for breast cancer 04/13/2011   Screening for colon cancer 04/13/2011   Hypertension 03/27/2011    ONSET DATE: 4/24  REFERRING DIAG: Bilateral thumb CMC OA   THERAPY DIAG:  Pain in joint of right hand  Stiffness of right hand, not elsewhere classified  Muscle weakness (generalized)  Rationale for Evaluation and Treatment: Rehabilitation  SUBJECTIVE:   SUBJECTIVE STATEMENT: My right thumb has been bothering me for a while.  Probably for 2 to 3 years the pain increased but got really worse the last 3 to 6 months.  Right-handed.  And I want to try and avoid surgery. Pt accompanied by: self  PERTINENT  HISTORY: DR Allena Katz note 03/04/23 :  Shelley Zuniga is a 72 y.o. female is here today for follow up of CMC osteoarthritis. The patient's allergies, current medications, past family history, past medical history, past social history, past surgical history and problem list were reviewed and updated as appropriate.   She has pain of both thumb joints. She is able to form a fist. She does have trouble with grip. The right [facility] joint is worse. She takes Advil for pain on as needed bases. She also uses Voltaren gel. She has no pain or  swelling of the other joint   - refer to OT   PRECAUTIONS: None  R   WEIGHT BEARING RESTRICTIONS: No  PAIN:  Are you having pain? 5/10 pain with R thumb CMC with use   FALLS: Has patient fallen in last 6 months? 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  Works on the garden  PATIENT GOALS: Would like to get the pain better my right thumb and maintain my motion and strength.  To avoid surgery.    OBJECTIVE:  Note: Objective measures were completed at Evaluation unless otherwise noted.  HAND DOMINANCE: Right   UPPER EXTREMITY ROM:    Wrist AROM and strength WNL - no increase symptoms  Active ROM Right eval Left eval  Thumb MCP (0-60)    Thumb IP (0-80)    Thumb Radial abd/add (0-55) 48 pain with ADD    Thumb Palmar abd/add (0-45) 42 pain with ADD    Thumb Opposition to Small Finger Opposition to third.  Increased pain to fourth and fifth with decreased IP and MC flexion and CMC abduction    Index MCP (0-90)     Index PIP (0-100)     Index DIP (0-70)      Long MCP (0-90)      Long PIP (0-100)      Long DIP (0-70)      Ring MCP (0-90)      Ring PIP (0-100)      Ring DIP (0-70)      Little MCP (0-90)      Little PIP (0-100)      Little DIP (0-70)      (Blank rows = not tested)      HAND FUNCTION: EVAL -R grip 35, L 45 lbs - Lat grip R 11 and L 12 lbs , 3 point pinch R 7 and L 12 lbs   Increase pain with release in R hand   COORDINATION: Decreased opposition and decreased precision pinch and 3-point pinch.  With steps and buttons and small objects  SENSATION: Denies any sensory changes  EDEMA none noticed  COGNITION: Overall cognitive status: Within functional limits for tasks assessed    TODAY'S TREATMENT:  DATE:  03/09/23 Paraffin moist heat to bilateral hands prior to review of exercises.  And home program. Patient to do moist heat at home in the morning and evening. Followed by husband assist with soft tissue massage to webspace and metacarpal sprites prior to bilateral thumb palmar radial abduction pain-free Blocked IP and MC flexion of thumb 10 reps prior to Opposition to all digits if pain-free. Patient to continue wearing prefab thumb CMC restriction splint with functional activities to support CMC out of palm facilitating IP MC flexion Handout reviewed and provided for patient about joint protection to implement as well as adaptive equipment. Patient to follow-up with me in a week or 2.   PATIENT EDUCATION: Education details: findings of eval and HEP  Person educated: Patient Education method: Explanation, Demonstration, Tactile cues, Verbal cues, and Handouts Education comprehension: verbalized understanding, returned demonstration, verbal cues required, and needs further education  GOALS: Goals reviewed with patient? Yes   LONG TERM GOALS: Target date: 6  wks   Patient be independent in home program to increase right thumb palmar radial abduction with less pain. Baseline: Palmar radial abduction right thumb 42 to 48 degrees with 2/10 pain and increase in adduction 5/10.  Tight in right webspace.  No knowledge of home program. Goal status: INITIAL  2.  Patient to verbalize and demo 2-3 joint protection and adaptive equipment to decrease pain and increased ease of use of right hand in ADLs and IADLs Baseline: No knowledge of adaptations or modifications as well as adaptive equipment. Goal status: INITIAL  ASSESSMENT:  CLINICAL IMPRESSION: Patient  seen today for occupational therapy evaluation for bilateral thumb CMC OA with increased pain right hand worse than left.  Patient is right-hand dominant.  Patient arrive with prefab right thumb CMC splint in place.  Appeared to fit well.   Patient encouraged to use it during the day for functional activities to support [facility] out of the palm and facilitating increased IP MC flexion with opposition and precision pinch.  Patient with decreased thumb palmar radial abduction in the right hand with increased pain with adduction.  Pain to-5/10 in thumb.  Patient with decreased grip and 3-point pinch in the right more than the left with increased pain with release.  Patient was provided with a home program to decrease pain and increased motion and maintain or increase strength in right hand.  And maintain left hand active range of motion and strength.  Patient can benefit from skilled OT services to decrease pain and increase motion and increase strength to be independent in ADLs and IADLs. PERFORMANCE DEFICITS: in functional skills including ADLs, IADLs, ROM, strength, pain, flexibility, decreased knowledge of use of DME, and UE functional use,   and psychosocial skills including environmental adaptation and routines and behaviors.   IMPAIRMENTS: are limiting patient from ADLs, IADLs, rest and sleep, play, leisure, and social participation.   COMORBIDITIES: has no other co-morbidities that affects occupational performance. Patient will benefit from skilled OT to address above impairments and improve overall function.  MODIFICATION OR ASSISTANCE TO COMPLETE EVALUATION: No modification of tasks or assist necessary to complete an evaluation.  OT OCCUPATIONAL PROFILE AND HISTORY: Problem focused assessment: Including review of records relating to presenting problem.  CLINICAL DECISION MAKING: LOW - limited treatment options, no task modification necessary  REHAB POTENTIAL: Good for goals  EVALUATION COMPLEXITY: Low   PLAN:  OT FREQUENCY: 1 x/week to biweekly   OT DURATION: 6 weeks PLANNING: Splinting;Paraffin;Fluidtherapy;Contrast Bath;DME and/or AE instruction;Manual Therapy;Passive range  of motion;Therapeutic activities, Ultrasound,  Iontophoresis, There ex, pt education  CONSULTED AND AGREED WITH PLAN OF CARE: Patient     Oletta Cohn, OTR/L,CLT 03/09/2023, 8:36 PM

## 2023-03-16 ENCOUNTER — Ambulatory Visit: Payer: Medicare PPO | Admitting: Occupational Therapy

## 2023-03-18 ENCOUNTER — Telehealth: Payer: Self-pay | Admitting: Internal Medicine

## 2023-03-18 NOTE — Telephone Encounter (Signed)
Tested positive for covid this morning. Symptoms started on Monday with a sore throat. Now has body aches, cough, congestion. No SOBr, no fever. Taking Tylenolol, Advil, Robitussin Severe cough and sore throat.   Pt was advised of message created by Dr. Darrick Huntsman below:   Both antivirals are NOT effective,  not free ,  and in the case of PAXLOVID,  HAVE SIGNIFICANT SIDE EFFECTS THAT MAKE IT UNSAFE  I AM NO LONGER PRESCRIBING EITHER OF THEM .Marland Kitchen Patients can schedule an appt if they feel they need to use anything other than robitussin, mucinex or afrin    Pt stated that she will continue using the OTC meds. Pt was advised that if symptoms worsen she will need to be evaluated in person.

## 2023-03-18 NOTE — Telephone Encounter (Signed)
Patient just called and said she just tested positive for Covid. She would like to know what she can take for it. Can someone call her. Her number is [phone].

## 2023-03-23 ENCOUNTER — Ambulatory Visit: Payer: Medicare PPO | Admitting: Occupational Therapy

## 2023-03-25 ENCOUNTER — Other Ambulatory Visit: Payer: Self-pay

## 2023-03-25 DIAGNOSIS — E785 Hyperlipidemia, unspecified: Secondary | ICD-10-CM

## 2023-03-25 MED ORDER — ATORVASTATIN CALCIUM 20 MG PO TABS
20.0000 mg | ORAL_TABLET | Freq: Every day | ORAL | 3 refills | Status: DC
Start: 2023-03-25 — End: 2024-03-16

## 2023-03-25 MED ORDER — BUPROPION HCL ER (SR) 100 MG PO TB12: 100.0000 mg | ORAL_TABLET | Freq: Two times a day (BID) | ORAL | 1 refills | Status: DC

## 2023-03-30 ENCOUNTER — Ambulatory Visit: Payer: Medicare PPO | Attending: Rheumatology | Admitting: Occupational Therapy

## 2023-03-30 DIAGNOSIS — M25541 Pain in joints of right hand: Secondary | ICD-10-CM | POA: Diagnosis not present

## 2023-03-30 DIAGNOSIS — M6281 Muscle weakness (generalized): Secondary | ICD-10-CM | POA: Insufficient documentation

## 2023-03-30 DIAGNOSIS — M25641 Stiffness of right hand, not elsewhere classified: Secondary | ICD-10-CM | POA: Insufficient documentation

## 2023-03-30 NOTE — Therapy (Signed)
OUTPATIENT OCCUPATIONAL THERAPY ORTHO TREATMENT   Patient Name: Shelley Zuniga MRN: [id number] DOB:Dec 12, 1950, 72 y.o., female Today's Date: 03/30/2023  PCP: DR Darrick Huntsman REFERRING PROVIDER: DR Allena Katz  END OF SESSION:  OT End of Session - 03/30/23 0948     Visit Number 2    Number of Visits 4    Date for OT Re-Evaluation 04/20/23    OT Start Time 0949    OT Stop Time 1027    OT Time Calculation (min) 38 min    Activity Tolerance Patient tolerated treatment well    Behavior During Therapy [facility] for tasks assessed/performed             Past Medical History:  Diagnosis Date   B12 neuropathy (HCC) 10/20/2013   Colon polyp 2001   colonoscopy, [facility]    Headache    regularly, not migraines   Heart murmur    Hypertension    Motion sickness    PONV (postoperative nausea and vomiting)    Strabismus age 79   eye surgery   Tick bite of abdominal wall 10/22/2020   Wears hearing aid in both ears    Past Surgical History:  Procedure Laterality Date   ABDOMINAL HYSTERECTOMY  2000   [place], [facility], fibroids/menorrhagia   BASAL CELL CARCINOMA EXCISION Left 2013   Dr. Orson Aloe   BILATERAL OOPHORECTOMY  2000   fibroid cysts   CATARACT EXTRACTION W/PHACO Left 07/11/2019   Procedure: CATARACT EXTRACTION PHACO AND INTRAOCULAR LENS PLACEMENT (IOC) LEFT 8.41 00:49.8 ;  Surgeon: Galen Manila, MD;  Location: [facility];  Service: Ophthalmology;  Laterality: Left;   CATARACT EXTRACTION W/PHACO Right 07/25/2019   Procedure: CATARACT EXTRACTION PHACO AND INTRAOCULAR LENS PLACEMENT (IOC) RIGHT TORIC LENS;  Surgeon: Galen Manila, MD;  Location: [facility];  Service: Ophthalmology;  Laterality: Right;  CDE 4.84 U/S 0:35.7   EYE SURGERY  1956   to correct strabismus   GALLBLADDER SURGERY     HEMORRHOID SURGERY     Patient Active Problem List   Diagnosis Date Noted   Chronic cough 04/18/2022   Exertional dyspnea 06/27/2021   RUQ abdominal pain 10/22/2020   Pulmonary  nodules 10/22/2020   Abdominal aortic atherosclerosis (HCC) 10/21/2020   Muscle spasm 09/16/2020   Personal history of COVID-19 06/11/2020   Osteoarthritis of thumb, left 12/18/2019   Polyneuropathy in other diseases classified elsewhere (HCC) 12/18/2019   Insomnia 01/07/2019   Sciatica of left side associated with disorder of lumbar spine 11/21/2016   Impaired fasting glucose 05/15/2016   Major depressive disorder, recurrent episode with melancholic features (HCC) 11/15/2015   Shingles outbreak 10/01/2014   B12 deficiency 10/20/2013   Vitamin D deficiency 10/20/2013   Encounter for preventive health examination 09/19/2012   Screening for osteoporosis 04/14/2011   Mixed hyperlipidemia 04/14/2011   Screening for cervical cancer 04/13/2011   Screening for breast cancer 04/13/2011   Screening for colon cancer 04/13/2011   Hypertension 03/27/2011    ONSET DATE: 4/24  REFERRING DIAG: Bilateral thumb CMC OA   THERAPY DIAG:  Pain in joint of right hand  Stiffness of right hand, not elsewhere classified  Muscle weakness (generalized)  Rationale for Evaluation and Treatment: Rehabilitation  SUBJECTIVE:   SUBJECTIVE STATEMENT: Pain is better- I find myself hearing you speak- " hands have to work smarter not harder" and did get some adaptive tools and using warm water prior to exercises- pain is better  Pt accompanied by: self  PERTINENT HISTORY: DR Allena Katz note 03/04/23 :  Shelley Zuniga is a 72 y.o. female is here today for follow up of CMC osteoarthritis. The patient's allergies, current medications, past family history, past medical history, past social history, past surgical history and problem list were reviewed and updated as appropriate.   She has pain of both thumb joints. She is able to form a fist. She does have trouble with grip. The right [facility] joint is worse. She takes Advil for pain on as needed bases. She also uses Voltaren gel. She has no pain or swelling of the  other joint   - refer to OT   PRECAUTIONS: None  R   WEIGHT BEARING RESTRICTIONS: No  PAIN:  Are you having pain? 5210 pain with R thumb CMC with use   FALLS: Has patient fallen in last 6 months? 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  Works on the garden  PATIENT GOALS: Would like to get the pain better my right thumb and maintain my motion and strength.  To avoid surgery.    OBJECTIVE:  Note: Objective measures were completed at Evaluation unless otherwise noted.  HAND DOMINANCE: Right   UPPER EXTREMITY ROM:    Wrist AROM and strength WNL - no increase symptoms  Active ROM Right eval Left eval R 03/30/23  Thumb MCP (0-60)     Thumb IP (0-80)     Thumb Radial abd/add (0-55) 48 pain with ADD   52  Thumb Palmar abd/add (0-45) 42 pain with ADD   53  Thumb Opposition to Small Finger Opposition to third.  Increased pain to fourth and fifth with decreased IP and MC flexion and CMC abduction   Opposition to all digits - pain 1/10 and using IP and MC flexion   Index MCP (0-90)      Index PIP (0-100)      Index DIP (0-70)       Long MCP (0-90)       Long PIP (0-100)       Long DIP (0-70)       Ring MCP (0-90)       Ring PIP (0-100)       Ring DIP (0-70)       Little MCP (0-90)       Little PIP (0-100)       Little DIP (0-70)       (Blank rows = not tested)      HAND FUNCTION: EVAL -R grip 35, L 45 lbs - Lat grip R 11 and L 12 lbs , 3 point pinch R 7 and L 12 lbs   Increase pain with release in R hand  03/30/23  -R grip 52, L 45 lbs - Lat grip R 11 and L 12 lbs , 3 point pinch R 10 and L 12 lbs  With CMC splint on - pain less than 2/10   COORDINATION: Decreased opposition and decreased precision pinch and 3-point pinch.  With steps and buttons and small objects  SENSATION: Denies any sensory changes  EDEMA none noticed  COGNITION: Overall cognitive  status: Within functional limits for tasks assessed    TODAY'S TREATMENT:  DATE: 03/30/23 Assess AROM for thumb , wrist and digits  As well as grip and prehension strength -great progress - see flow sheet Less pain   Patient use moist heat at home in the morning and evening. prior to bilateral thumb palmar radial abduction pain-free - need some min A to do PA - done double RA 12 reps  Soft tissue done my OT - for webspace and MC spreads  Hurt to much for husband to do -attempt rolling on ball- but pt to only do moist heat and ROM - great progress with that  Pt with great progress doing oval - using IP and MC flexion with Opposition to all digits if pain-free. 10 reps Can use CMC splint with opposition Patient to continue wearing prefab thumb CMC restriction splint with functional activities to support CMC out of palm facilitating IP MC flexion Review again with patient about joint protection /modifications to implement as well as adaptive equipment. To increase ease and decrease pain     PATIENT EDUCATION: Education details: findings of eval and HEP  Person educated: Patient Education method: Explanation, Demonstration, Tactile cues, Verbal cues, and Handouts Education comprehension: verbalized understanding, returned demonstration, verbal cues required, and needs further education  GOALS: Goals reviewed with patient? Yes   LONG TERM GOALS: Target date: 6  wks   Patient be independent in home program to increase right thumb palmar radial abduction with less pain. Baseline: Palmar radial abduction right thumb 42 to 48 degrees with 2/10 pain and increase in adduction 5/10.  Tight in right webspace.  No knowledge of home program. Goal status:MET  2.  Patient to verbalize and demo 2-3 joint protection and adaptive equipment to decrease pain and increased ease  of use of right hand in ADLs and IADLs Baseline: No knowledge of adaptations or modifications as well as adaptive equipment. Goal status: MET  ASSESSMENT:  CLINICAL IMPRESSION: Patient  seen for occupational therapy evaluation for bilateral thumb CMC OA with increased pain right hand worse than left.  Patient is right-hand dominant.  Patient arrive with prefab right thumb CMC splint in place.  Appeared to fit well.  Patient encouraged to use it during the day for functional activities to support [facility] out of the palm and facilitating increased IP MC flexion with opposition and precision pinch.  Done great since eval and showed great progress in AROM for thumb PA and RA , increase grip and prehension strength and less pain - Pt to cont with HEP and modifications  and joint protection - as well as AE as needed - maintain left hand active range of motion and strength.  Pt met all goals and can contact me if need follow up - great progress PERFORMANCE DEFICITS: in functional skills including ADLs, IADLs, ROM, strength, pain, flexibility, decreased knowledge of use of DME, and UE functional use,   and psychosocial skills including environmental adaptation and routines and behaviors.   IMPAIRMENTS: are limiting patient from ADLs, IADLs, rest and sleep, play, leisure, and social participation.   COMORBIDITIES: has no other co-morbidities that affects occupational performance. Patient will benefit from skilled OT to address above impairments and improve overall function.  MODIFICATION OR ASSISTANCE TO COMPLETE EVALUATION: No modification of tasks or assist necessary to complete an evaluation.  OT OCCUPATIONAL PROFILE AND HISTORY: Problem focused assessment: Including review of records relating to presenting problem.  CLINICAL DECISION MAKING: LOW - limited treatment options, no task modification necessary  REHAB POTENTIAL: Good for goals  EVALUATION COMPLEXITY:  Low   PLAN:  OT FREQUENCY: 1 x/week  to biweekly   OT DURATION: 6 weeks PLANNING: Splinting;Paraffin;Fluidtherapy;Contrast Bath;DME and/or AE instruction;Manual Therapy;Passive range of motion;Therapeutic activities, Ultrasound, Iontophoresis, There ex, pt education  CONSULTED AND AGREED WITH PLAN OF CARE: Patient     Oletta Cohn, OTR/L,CLT 03/30/2023, 10:27 AM

## 2023-04-06 DIAGNOSIS — L814 Other melanin hyperpigmentation: Secondary | ICD-10-CM | POA: Diagnosis not present

## 2023-04-06 DIAGNOSIS — D2362 Other benign neoplasm of skin of left upper limb, including shoulder: Secondary | ICD-10-CM | POA: Diagnosis not present

## 2023-04-06 DIAGNOSIS — B078 Other viral warts: Secondary | ICD-10-CM | POA: Diagnosis not present

## 2023-04-06 DIAGNOSIS — Z85828 Personal history of other malignant neoplasm of skin: Secondary | ICD-10-CM | POA: Diagnosis not present

## 2023-04-06 DIAGNOSIS — L720 Epidermal cyst: Secondary | ICD-10-CM | POA: Diagnosis not present

## 2023-04-06 DIAGNOSIS — D2372 Other benign neoplasm of skin of left lower limb, including hip: Secondary | ICD-10-CM | POA: Diagnosis not present

## 2023-04-06 DIAGNOSIS — R238 Other skin changes: Secondary | ICD-10-CM | POA: Diagnosis not present

## 2023-04-06 DIAGNOSIS — L821 Other seborrheic keratosis: Secondary | ICD-10-CM | POA: Diagnosis not present

## 2023-04-06 DIAGNOSIS — Z08 Encounter for follow-up examination after completed treatment for malignant neoplasm: Secondary | ICD-10-CM | POA: Diagnosis not present

## 2023-04-12 ENCOUNTER — Telehealth: Payer: Self-pay | Admitting: Internal Medicine

## 2023-04-12 ENCOUNTER — Ambulatory Visit (INDEPENDENT_AMBULATORY_CARE_PROVIDER_SITE_OTHER): Payer: Medicare PPO

## 2023-04-12 DIAGNOSIS — E538 Deficiency of other specified B group vitamins: Secondary | ICD-10-CM | POA: Diagnosis not present

## 2023-04-12 DIAGNOSIS — H43813 Vitreous degeneration, bilateral: Secondary | ICD-10-CM | POA: Diagnosis not present

## 2023-04-12 DIAGNOSIS — Z0100 Encounter for examination of eyes and vision without abnormal findings: Secondary | ICD-10-CM | POA: Diagnosis not present

## 2023-04-12 DIAGNOSIS — Z961 Presence of intraocular lens: Secondary | ICD-10-CM | POA: Diagnosis not present

## 2023-04-12 DIAGNOSIS — H26493 Other secondary cataract, bilateral: Secondary | ICD-10-CM | POA: Diagnosis not present

## 2023-04-12 MED ORDER — CYANOCOBALAMIN 1000 MCG/ML IJ SOLN
1000.0000 ug | Freq: Once | INTRAMUSCULAR | Status: AC
Start: 2023-04-12 — End: 2023-04-12
  Administered 2023-04-12: 1000 ug via INTRAMUSCULAR

## 2023-04-12 MED ORDER — CYANOCOBALAMIN 1000 MCG/ML IJ SOLN
1000.0000 ug | Freq: Once | INTRAMUSCULAR | Status: DC
Start: 2023-04-12 — End: 2023-04-12

## 2023-04-12 MED ORDER — LOSARTAN POTASSIUM-HCTZ 100-25 MG PO TABS: 1.0000 | ORAL_TABLET | Freq: Every day | ORAL | 1 refills | Status: DC

## 2023-04-12 NOTE — Telephone Encounter (Signed)
Medication has been refilled.

## 2023-04-12 NOTE — Telephone Encounter (Signed)
Prescription Request  04/12/2023  LOV: 12/07/2022  What is the name of the medication or equipment? losartan-hydrochlorothiazide (HYZAAR) 100-25 MG tablet    Have you contacted your pharmacy to request a refill? Yes   Which pharmacy would you like this sent to?  [id number] - [place], [place] - [address] AT [facility] [address] [facility] [address] [place] [address] Phone: [phone] Fax: [phone]    Patient notified that their request is being sent to the clinical staff for review and that they should receive a response within 2 business days.   Please advise at [facility] [phone]

## 2023-04-12 NOTE — Progress Notes (Signed)
Pt presented for their vitamin B12 injection. Pt was identified through two identifiers. Pt tolerated shot well in their right deltoid.  

## 2023-04-12 NOTE — Addendum Note (Signed)
Addended by: Sandy Salaam on: 04/12/2023 11:34 AM   Modules accepted: Orders

## 2023-04-13 ENCOUNTER — Other Ambulatory Visit: Payer: Self-pay | Admitting: Internal Medicine

## 2023-04-13 DIAGNOSIS — Z01419 Encounter for gynecological examination (general) (routine) without abnormal findings: Secondary | ICD-10-CM | POA: Diagnosis not present

## 2023-04-20 ENCOUNTER — Other Ambulatory Visit: Payer: Self-pay | Admitting: Pulmonary Disease

## 2023-05-17 ENCOUNTER — Ambulatory Visit (INDEPENDENT_AMBULATORY_CARE_PROVIDER_SITE_OTHER): Payer: Medicare PPO

## 2023-05-17 DIAGNOSIS — E538 Deficiency of other specified B group vitamins: Secondary | ICD-10-CM

## 2023-05-17 MED ORDER — CYANOCOBALAMIN 1000 MCG/ML IJ SOLN
1000.0000 ug | Freq: Once | INTRAMUSCULAR | Status: AC
  Administered 2023-05-17: 1000 ug via INTRAMUSCULAR

## 2023-05-17 NOTE — Progress Notes (Cosign Needed)
Pt presents for b12 injection. Administered in L deltoid. No complaints or concerns at this time.

## 2023-06-05 ENCOUNTER — Ambulatory Visit
Admission: EM | Admit: 2023-06-05 | Discharge: 2023-06-05 | Disposition: A | Discharge status: Home / Self Care | Payer: Medicare PPO | Attending: Emergency Medicine | Admitting: Emergency Medicine

## 2023-06-05 ENCOUNTER — Encounter: Payer: Self-pay | Admitting: *Deleted

## 2023-06-05 DIAGNOSIS — R350 Frequency of micturition: Secondary | ICD-10-CM | POA: Insufficient documentation

## 2023-06-05 LAB — POCT URINALYSIS DIP (MANUAL ENTRY)
Bilirubin, UA: NEGATIVE
Glucose, UA: NEGATIVE mg/dL
Ketones, POC UA: NEGATIVE mg/dL
Nitrite, UA: NEGATIVE
Protein Ur, POC: 100 mg/dL — AB
Spec Grav, UA: 1.02
Urobilinogen, UA: 0.2 U/dL
pH, UA: 5.5

## 2023-06-05 MED ORDER — NITROFURANTOIN MONOHYD MACRO 100 MG PO CAPS: 100.0000 mg | ORAL_CAPSULE | Freq: Two times a day (BID) | ORAL | 0 refills | Status: DC

## 2023-06-05 MED ORDER — CEPHALEXIN 500 MG PO CAPS: 500.0000 mg | ORAL_CAPSULE | Freq: Three times a day (TID) | ORAL | 0 refills | Status: AC

## 2023-06-05 MED ORDER — PHENAZOPYRIDINE HCL 200 MG PO TABS: 200.0000 mg | ORAL_TABLET | Freq: Three times a day (TID) | ORAL | 0 refills | Status: DC

## 2023-06-05 NOTE — Discharge Instructions (Addendum)
Your urinalysis does show bacteria at this time your urine will be sent to the lab to determine exactly which bacteria is present, if any changes need to be made to your medications you will be notified  Begin use of cephalexin every 8 hours for 5 day, do not pick up nitrofurantoin (Macrobid)   Use Pyridium every 8 hours to help with urinary discomfort help minimize your symptoms until antibiotic removes bacteria, this medication will turn your urine orange  Increase your fluid intake through use of water  As always practice good hygiene, wiping front to back and avoidance of scented vaginal products to prevent further irritation  If symptoms continue to persist after use of medication or recur please follow-up with urgent care or your primary doctor as needed

## 2023-06-05 NOTE — ED Triage Notes (Signed)
Patient states she has been having urinary pain and frequency that started this morning.

## 2023-06-05 NOTE — ED Provider Notes (Signed)
Shelley Zuniga    CSN: [id number] Arrival date & time: 06/05/23  1404      History   Chief Complaint Chief Complaint  Patient presents with   Dysuria    HPI Shelley Zuniga is a 73 y.o. female.   Patient presents for evaluation of urinary frequency, dysuria beginning this morning.  Has attempted use of Tylenol.  Denies hematuria abdominal pain, flank pain, fever or vaginal symptoms.  Past Medical History:  Diagnosis Date   B12 neuropathy (HCC) 10/20/2013   Colon polyp 2001   colonoscopy, [facility]    Headache    regularly, not migraines   Heart murmur    Hypertension    Motion sickness    PONV (postoperative nausea and vomiting)    Strabismus age 53   eye surgery   Tick bite of abdominal wall 10/22/2020   Wears hearing aid in both ears     Patient Active Problem List   Diagnosis Date Noted   Chronic cough 04/18/2022   Exertional dyspnea 06/27/2021   RUQ abdominal pain 10/22/2020   Pulmonary nodules 10/22/2020   Abdominal aortic atherosclerosis (HCC) 10/21/2020   Muscle spasm 09/16/2020   Personal history of COVID-19 06/11/2020   Osteoarthritis of thumb, left 12/18/2019   Polyneuropathy in other diseases classified elsewhere (HCC) 12/18/2019   Insomnia 01/07/2019   Sciatica of left side associated with disorder of lumbar spine 11/21/2016   Impaired fasting glucose 05/15/2016   Major depressive disorder, recurrent episode with melancholic features (HCC) 11/15/2015   Shingles outbreak 10/01/2014   B12 deficiency 10/20/2013   Vitamin D deficiency 10/20/2013   Encounter for preventive health examination 09/19/2012   Screening for osteoporosis 04/14/2011   Mixed hyperlipidemia 04/14/2011   Screening for cervical cancer 04/13/2011   Screening for breast cancer 04/13/2011   Screening for colon cancer 04/13/2011   Hypertension 03/27/2011    Past Surgical History:  Procedure Laterality Date   ABDOMINAL HYSTERECTOMY  2000   [place], [facility], fibroids/menorrhagia    BASAL CELL CARCINOMA EXCISION Left 2013   Dr. Orson Aloe   BILATERAL OOPHORECTOMY  2000   fibroid cysts   CATARACT EXTRACTION W/PHACO Left 07/11/2019   Procedure: CATARACT EXTRACTION PHACO AND INTRAOCULAR LENS PLACEMENT (IOC) LEFT 8.41 00:49.8 ;  Surgeon: Galen Manila, MD;  Location: [facility];  Service: Ophthalmology;  Laterality: Left;   CATARACT EXTRACTION W/PHACO Right 07/25/2019   Procedure: CATARACT EXTRACTION PHACO AND INTRAOCULAR LENS PLACEMENT (IOC) RIGHT TORIC LENS;  Surgeon: Galen Manila, MD;  Location: [facility];  Service: Ophthalmology;  Laterality: Right;  CDE 4.84 U/S 0:35.7   EYE SURGERY  1956   to correct strabismus   GALLBLADDER SURGERY     HEMORRHOID SURGERY      OB History   No obstetric history on file.      Home Medications    Prior to Admission medications   Medication Sig Start Date End Date Taking? Authorizing Provider  cephALEXin (KEFLEX) 500 MG capsule Take 1 capsule (500 mg total) by mouth 3 (three) times daily for 5 days. 06/05/23 06/10/23 Yes Gustavus Haskin, Elita Boone, NP  phenazopyridine (PYRIDIUM) 200 MG tablet Take 1 tablet (200 mg total) by mouth 3 (three) times daily. 06/05/23  Yes Konica Stankowski R, NP  albuterol (VENTOLIN HFA) 108 (90 Base) MCG/ACT inhaler Inhale 2 puffs into the lungs every 6 (six) hours as needed for wheezing or shortness of breath. 01/04/23   Salena Saner, MD  amLODipine (NORVASC) 5 MG tablet TAKE 1 TABLET(5  MG) BY MOUTH DAILY 12/07/22   Sherlene Shams, MD  aspirin 81 MG tablet Take 81 mg by mouth daily.    [provider]  atorvastatin (LIPITOR) 20 MG tablet Take 1 tablet (20 mg total) by mouth daily. 03/25/23   Sherlene Shams, MD  BREO ELLIPTA 100-25 MCG/ACT AEPB INHALE 1 PUFF INTO THE LUNGS DAILY 04/20/23   Salena Saner, MD  buPROPion ER [facility] SR) 100 MG 12 hr tablet Take 1 tablet (100 mg total) by mouth 2 (two) times daily. 03/25/23   Sherlene Shams, MD  Cranberry 500 MG CAPS Take 1  capsule by mouth daily.    [provider]  Docusate Calcium (STOOL SOFTENER PO) Take 1 capsule by mouth daily.    [provider]  estradiol (ESTRACE) 1 MG tablet Take 1 mg by mouth daily.    [provider]  hydrocortisone cream 0.5 % Apply 1 application. topically 2 (two) times daily as needed for itching. 08/24/21   Sherlene Shams, MD  losartan (COZAAR) 100 MG tablet Take 1 tablet (100 mg total) by mouth daily. Patient not taking: Reported on 12/07/2022 10/19/22   Sherlene Shams, MD  losartan-hydrochlorothiazide (HYZAAR) 100-25 MG tablet Take 1 tablet by mouth daily. 04/12/23   Sherlene Shams, MD  montelukast (SINGULAIR) 10 MG tablet Take 1 tablet (10 mg total) by mouth at bedtime. 02/19/23   Sherlene Shams, MD  PREVIDENT 5000 DRY MOUTH 1.1 % GEL dental gel  04/10/13   [provider]  VITAMIN D PO Take 400 mg by mouth daily.    [provider]  vitamin E 400 UNIT capsule Take 400 Units by mouth daily.    [provider]    Family History Family History  Problem Relation Age of Onset   Cancer Mother        melanoma, squamous cell on nose   Hypertension Mother    COPD Mother    Hyperlipidemia Mother    Cancer Father        lung, tobacco user   Cancer Brother        throat, tobacco user   Cancer Maternal Grandmother        uterine/ovarian    Social History Social History   Tobacco Use   Smoking status: Never   Smokeless tobacco: Never  Vaping Use   Vaping status: Never Used  Substance Use Topics   Alcohol use: No   Drug use: No     Allergies   Morphine and codeine and Sulfa antibiotics   Review of Systems Review of Systems   Physical Exam Triage Vital Signs ED Triage Vitals  Encounter Vitals Group     BP 06/05/23 1520 (!) 143/77     Systolic BP Percentile --      Diastolic BP Percentile --      Pulse Rate 06/05/23 1520 79     Resp 06/05/23 1520 18     Temp 06/05/23 1520 99 F (37.2 C)     Temp Source  06/05/23 1520 Oral     SpO2 06/05/23 1520 95 %     Weight 06/05/23 1518 165 lb (74.8 kg)     Height 06/05/23 1518 5\' 4"  (1.626 m)     Head Circumference --      Peak Flow --      Pain Score 06/05/23 1518 7     Pain Loc --      Pain Education --  Exclude from Growth Chart --    No data found.  Updated Vital Signs BP (!) 143/77 (BP Location: Left Arm)   Pulse 79   Temp 99 F (37.2 C) (Oral)   Resp 18   Ht 5\' 4"  (1.626 m)   Wt 165 lb (74.8 kg)   SpO2 95%   BMI 28.32 kg/m   Visual Acuity Right Eye Distance:   Left Eye Distance:   Bilateral Distance:    Right Eye Near:   Left Eye Near:    Bilateral Near:     Physical Exam Constitutional:      Appearance: Normal appearance.  Eyes:     Extraocular Movements: Extraocular movements intact.  Pulmonary:     Effort: Pulmonary effort is normal.  Abdominal:     Tenderness: There is no right CVA tenderness or left CVA tenderness.  Neurological:     Mental Status: She is alert.      UC Treatments / Results  Labs (all labs ordered are listed, but only abnormal results are displayed) Labs Reviewed  POCT URINALYSIS DIP (MANUAL ENTRY) - Abnormal; Notable for the following components:      Result Value   Clarity, UA cloudy (*)    Blood, UA large (*)    Protein Ur, POC =100 (*)    Leukocytes, UA Large (3+) (*)    All other components within normal limits  URINE CULTURE    EKG   Radiology No results found.  Procedures Procedures (including critical care time)  Medications Ordered in UC Medications - No data to display  Initial Impression / Assessment and Plan / UC Course  I have reviewed the triage vital signs and the nursing notes.  Pertinent labs & imaging results that were available during my care of the patient were reviewed by me and considered in my medical decision making (see chart for details).  Urinary frequency  Urinalysis showing leukocytes and hemoglobin, negative for nitrates, discussed  with patient, treating prophylactically as she is symptomatic, cephalexin and Pyridium prescribed, discussed additional supportive care with follow-up as needed Final Clinical Impressions(s) / UC Diagnoses   Final diagnoses:  Urinary frequency     Discharge Instructions      Your urinalysis does show bacteria at this time your urine will be sent to the lab to determine exactly which bacteria is present, if any changes need to be made to your medications you will be notified  Begin use of cephalexin every 8 hours for 5 day, do not pick up nitrofurantoin (Macrobid)   Use Pyridium every 8 hours to help with urinary discomfort help minimize your symptoms until antibiotic removes bacteria, this medication will turn your urine orange  Increase your fluid intake through use of water  As always practice good hygiene, wiping front to back and avoidance of scented vaginal products to prevent further irritation  If symptoms continue to persist after use of medication or recur please follow-up with urgent care or your primary doctor as needed     ED Prescriptions     Medication Sig Dispense Auth. Provider   nitrofurantoin, macrocrystal-monohydrate, (MACROBID) 100 MG capsule  (Status: Discontinued) Take 1 capsule (100 mg total) by mouth 2 (two) times daily. 10 capsule Ezekiel Menzer R, NP   phenazopyridine (PYRIDIUM) 200 MG tablet Take 1 tablet (200 mg total) by mouth 3 (three) times daily. 6 tablet Tayanna Talford R, NP   cephALEXin (KEFLEX) 500 MG capsule Take 1 capsule (500 mg total) by mouth 3 (three)  times daily for 5 days. 15 capsule Lorrine Killilea, Elita Boone, NP      PDMP not reviewed this encounter.   Valinda Hoar, NP 06/05/23 1547

## 2023-06-07 ENCOUNTER — Telehealth (HOSPITAL_COMMUNITY): Payer: Self-pay

## 2023-06-07 LAB — URINE CULTURE: Culture: >=100000 — AB

## 2023-06-07 MED ORDER — CIPROFLOXACIN HCL 500 MG PO TABS: 500.0000 mg | ORAL_TABLET | Freq: Two times a day (BID) | ORAL | 0 refills | Status: AC

## 2023-06-07 NOTE — Telephone Encounter (Signed)
VO per A. Hermanns, PA-C, "cipro 500mg  BID x 7 days." Attempted to reach patient x1. LVM.  Rx sent to pharmacy on file.

## 2023-06-14 ENCOUNTER — Ambulatory Visit (INDEPENDENT_AMBULATORY_CARE_PROVIDER_SITE_OTHER): Payer: Medicare PPO

## 2023-06-14 DIAGNOSIS — E538 Deficiency of other specified B group vitamins: Secondary | ICD-10-CM

## 2023-06-14 MED ORDER — CYANOCOBALAMIN 1000 MCG/ML IJ SOLN
1000.0000 ug | Freq: Once | INTRAMUSCULAR | Status: AC
  Administered 2023-06-14: 1000 ug via INTRAMUSCULAR

## 2023-06-14 NOTE — Progress Notes (Cosign Needed Addendum)
Pt presented for their vitamin B12 injection. Pt was identified through two identifiers. Pt tolerated shot well in their right deltoid.

## 2023-07-05 DIAGNOSIS — Z1231 Encounter for screening mammogram for malignant neoplasm of breast: Secondary | ICD-10-CM | POA: Diagnosis not present

## 2023-07-05 DIAGNOSIS — R92313 Mammographic fatty tissue density, bilateral breasts: Secondary | ICD-10-CM | POA: Diagnosis not present

## 2023-07-05 LAB — HM MAMMOGRAPHY

## 2023-07-07 DIAGNOSIS — M19049 Primary osteoarthritis, unspecified hand: Secondary | ICD-10-CM | POA: Diagnosis not present

## 2023-07-08 ENCOUNTER — Ambulatory Visit: Payer: Medicare PPO | Admitting: Pulmonary Disease

## 2023-07-08 ENCOUNTER — Encounter: Payer: Self-pay | Admitting: Pulmonary Disease

## 2023-07-08 VITALS — BP 122/78 | HR 77 | Temp 97.1°F | Ht 64.0 in | Wt 155.0 lb

## 2023-07-08 DIAGNOSIS — R0609 Other forms of dyspnea: Secondary | ICD-10-CM | POA: Diagnosis not present

## 2023-07-08 DIAGNOSIS — R911 Solitary pulmonary nodule: Secondary | ICD-10-CM | POA: Diagnosis not present

## 2023-07-08 DIAGNOSIS — J4540 Moderate persistent asthma, uncomplicated: Secondary | ICD-10-CM | POA: Diagnosis not present

## 2023-07-08 LAB — NITRIC OXIDE: Nitric Oxide: 17

## 2023-07-08 NOTE — Progress Notes (Unsigned)
 Subjective:    Patient ID: Shelley Zuniga, female    DOB: [birth year], 73 y.o.   MRN: [id number]  Patient Care Team: Sherlene Shams, MD as PCP - General (Internal Medicine) Salena Saner, MD as Consulting Physician (Pulmonary Disease)  Chief Complaint  Patient presents with  . Follow-up    DOE. No wheezing. Dry cough.    BACKGROUND/INTERVAL:  HPI     DATA 07/15/2021 PFTs: FEV1 1.68 L or 75% predicted, FVC 2.96 L or 106% predicted, FEV1/FVC 83%, significant bronchodilator response with a net change of 21% and FEV1 diffusion capacity normal, lung volumes normal, consistent with moderate obstructive airways disease with significant bronchodilator response (asthmatic type). 06/04/2022 PFTs: FEV1 1.62 L or 105% predicted, FVC 2.23 L or 107% predicted, FEV1/FVC 66%, postbronchodilator her lung volumes normalized.  Normal diffusion capacity.  Consistent with mild obstructive defect asthmatic type.  Review of Systems A 10 point review of systems was performed and it is as noted above otherwise negative.   Patient Active Problem List   Diagnosis Date Noted  . Chronic cough 04/18/2022  . Exertional dyspnea 06/27/2021  . RUQ abdominal pain 10/22/2020  . Pulmonary nodules 10/22/2020  . Abdominal aortic atherosclerosis (HCC) 10/21/2020  . Muscle spasm 09/16/2020  . Personal history of COVID-19 06/11/2020  . Osteoarthritis of thumb, left 12/18/2019  . Polyneuropathy in other diseases classified elsewhere (HCC) 12/18/2019  . Insomnia 01/07/2019  . Sciatica of left side associated with disorder of lumbar spine 11/21/2016  . Impaired fasting glucose 05/15/2016  . Major depressive disorder, recurrent episode with melancholic features (HCC) 11/15/2015  . Shingles outbreak 10/01/2014  . B12 deficiency 10/20/2013  . Vitamin D deficiency 10/20/2013  . Encounter for preventive health examination 09/19/2012  . Screening for osteoporosis 04/14/2011  . Mixed hyperlipidemia 04/14/2011  .  Screening for cervical cancer 04/13/2011  . Screening for breast cancer 04/13/2011  . Screening for colon cancer 04/13/2011  . Hypertension 03/27/2011    Social History   Tobacco Use  . Smoking status: Never  . Smokeless tobacco: Never  Substance Use Topics  . Alcohol use: No    Allergies  Allergen Reactions  . Morphine And Codeine Itching  . Sulfa Antibiotics Swelling    Tongue    Current Meds  Medication Sig  . albuterol (VENTOLIN HFA) 108 (90 Base) MCG/ACT inhaler Inhale 2 puffs into the lungs every 6 (six) hours as needed for wheezing or shortness of breath.  Marland Kitchen amLODipine (NORVASC) 5 MG tablet TAKE 1 TABLET(5 MG) BY MOUTH DAILY  . aspirin 81 MG tablet Take 81 mg by mouth daily.  Marland Kitchen atorvastatin (LIPITOR) 20 MG tablet Take 1 tablet (20 mg total) by mouth daily.  Marland Kitchen BREO ELLIPTA 100-25 MCG/ACT AEPB INHALE 1 PUFF INTO THE LUNGS DAILY  . buPROPion ER (WELLBUTRIN SR) 100 MG 12 hr tablet Take 1 tablet (100 mg total) by mouth 2 (two) times daily.  . Cranberry 500 MG CAPS Take 1 capsule by mouth daily.  Tery Sanfilippo Calcium (STOOL SOFTENER PO) Take 1 capsule by mouth daily.  Marland Kitchen estradiol (ESTRACE) 1 MG tablet Take 1 mg by mouth daily.  . hydrocortisone cream 0.5 % Apply 1 application. topically 2 (two) times daily as needed for itching.  . losartan-hydrochlorothiazide (HYZAAR) 100-25 MG tablet Take 1 tablet by mouth daily.  . montelukast (SINGULAIR) 10 MG tablet Take 1 tablet (10 mg total) by mouth at bedtime.  Marland Kitchen PREVIDENT 5000 DRY MOUTH 1.1 % GEL dental gel   .  VITAMIN D PO Take 400 mg by mouth daily.  . vitamin E 400 UNIT capsule Take 400 Units by mouth daily.    Immunization History  Administered Date(s) Administered  . Fluad Quad(high Dose 65+) 03/19/2020, 02/14/2021  . Fluad Trivalent(High Dose 65+) 03/08/2023  . Influenza Split 03/26/2012  . Influenza, High Dose Seasonal PF 02/22/2019, 03/13/2022  . Influenza,inj,Quad PF,6+ Mos 02/22/2013, 02/10/2014  .  Influenza-Unspecified 04/04/2016, 03/13/2017, 01/31/2018  . PFIZER Comirnaty(Gray Top)Covid-19 Tri-Sucrose Vaccine 05/25/2019, 06/15/2019  . PFIZER(Purple Top)SARS-COV-2 Vaccination 05/25/2019, 06/15/2019, 10/05/2019, 02/12/2020  . Pneumococcal Conjugate-13 05/15/2016  . Pneumococcal Polysaccharide-23 03/14/2020  . Rsv, Bivalent, Protein Subunit Rsvpref,pf Verdis Frederickson) 05/27/2022  . Td 12/08/2018  . Tdap 01/20/2010  . Zoster, Live 10/19/2013        Objective:     BP 122/78 (BP Location: Right Arm, Cuff Size: Normal)   Pulse 77   Temp (!) 97.1 F (36.2 C)   Ht 5\' 4"  (1.626 m)   Wt 155 lb (70.3 kg)   SpO2 96%   BMI 26.61 kg/m   SpO2: 96 % O2 Device: None (Room air)  GENERAL: Overweight woman, no acute distress, fully ambulatory.  No conversational dyspnea. HEAD: Normocephalic, atraumatic.  EYES: Pupils equal, round, reactive to light. No scleral icterus.  MOUTH: Dentition intact, oral mucosa moist.  No thrush. NECK: Supple. No thyromegaly. Trachea midline. No JVD. No adenopathy. PULMONARY: Good air entry bilaterally. No adventitious sounds. CARDIOVASCULAR: S1 and S2. Regular rate and rhythm.  No rubs, murmurs or gallops heard. ABDOMEN: Benign. MUSCULOSKELETAL: Mild OA changes both hands, no clubbing, no edema.  NEUROLOGIC: No overt focal deficit, no gait disturbance, speech is fluent. SKIN: Intact,warm,dry. PSYCH: Mood and behavior normal          Assessment & Plan:   No diagnosis found.  No orders of the defined types were placed in this encounter.   No orders of the defined types were placed in this encounter.      Advised if symptoms do not improve or worsen, to please contact office for sooner follow up or seek emergency care.    I spent xxx minutes of dedicated to the care of this patient on the date of this encounter to include pre-visit review of records, face-to-face time with the patient discussing conditions above, post visit ordering of testing,  clinical documentation with the electronic health record, making appropriate referrals as documented, and communicating necessary findings to members of the patients care team.     C. Danice Goltz, MD Advanced Bronchoscopy PCCM [facility]    *This note was generated using voice recognition software/Dragon and/or AI transcription program.  Despite best efforts to proofread, errors can occur which can change the meaning. Any transcriptional errors that result from this process are unintentional and may not be fully corrected at the time of dictation.

## 2023-07-08 NOTE — Patient Instructions (Signed)
 VISIT SUMMARY:  Today, we discussed your shortness of breath during physical activities and your well-controlled asthma. We talked about the importance of ruling out any potential heart-related causes for your symptoms and maintaining your current asthma treatment.  YOUR PLAN:  -DYSPNEA ON EXERTION: Dyspnea on exertion means experiencing shortness of breath during physical activities. To ensure your symptoms are not related to heart issues, we will order an echocardiogram to check your heart's function. We will follow up in 4-6 months to review the results and discuss any further steps if needed.  -ASTHMA: Asthma is a condition where your airways become inflamed and narrow, making it hard to breathe. Your asthma is well-controlled with your current medication, Breo, which you should continue using daily. We will keep monitoring your symptoms to ensure they remain under control.  INSTRUCTIONS:  Please schedule an echocardiogram to assess your heart function. We will have a follow-up appointment in 4-6 months to review the results and discuss any further steps. If you need a cardiologist, you can see Dr. Mariah Milling as you prefer.

## 2023-07-12 ENCOUNTER — Ambulatory Visit (INDEPENDENT_AMBULATORY_CARE_PROVIDER_SITE_OTHER): Payer: Medicare PPO

## 2023-07-12 DIAGNOSIS — E538 Deficiency of other specified B group vitamins: Secondary | ICD-10-CM

## 2023-07-12 MED ORDER — CYANOCOBALAMIN 1000 MCG/ML IJ SOLN
1000.0000 ug | Freq: Once | INTRAMUSCULAR | Status: AC
  Administered 2023-07-12: 1000 ug via INTRAMUSCULAR

## 2023-07-12 NOTE — Progress Notes (Signed)
 Pt presented for their vitamin B12 injection. Pt was identified through two identifiers. Pt tolerated shot well in their left deltoid.

## 2023-07-14 ENCOUNTER — Encounter: Payer: Self-pay | Admitting: Internal Medicine

## 2023-07-16 MED ORDER — AMLODIPINE BESYLATE 5 MG PO TABS: ORAL_TABLET | ORAL | 1 refills | Status: DC

## 2023-07-30 ENCOUNTER — Ambulatory Visit
Admission: RE | Admit: 2023-07-30 | Discharge: 2023-07-30 | Disposition: A | Discharge status: Home / Self Care | Payer: Medicare PPO | Source: Ambulatory Visit | Attending: Pulmonary Disease | Admitting: Pulmonary Disease

## 2023-07-30 DIAGNOSIS — R0609 Other forms of dyspnea: Secondary | ICD-10-CM | POA: Insufficient documentation

## 2023-07-30 DIAGNOSIS — I517 Cardiomegaly: Secondary | ICD-10-CM | POA: Diagnosis not present

## 2023-07-30 DIAGNOSIS — I358 Other nonrheumatic aortic valve disorders: Secondary | ICD-10-CM | POA: Diagnosis not present

## 2023-07-30 NOTE — Progress Notes (Signed)
*  PRELIMINARY RESULTS* Echocardiogram 2D Echocardiogram has been performed.  Shelley Zuniga 07/30/2023, 10:59 AM

## 2023-08-02 LAB — ECHOCARDIOGRAM COMPLETE
AR max vel: 3.09 cm2
AV Area VTI: 3.25 cm2
AV Area mean vel: 3.33 cm2
AV Mean grad: 3 mmHg
AV Peak grad: 6.5 mmHg
Ao pk vel: 1.27 m/s
Area-P 1/2: 3.36 cm2
MV VTI: 3.05 cm2
S' Lateral: 2.1 cm

## 2023-08-04 ENCOUNTER — Ambulatory Visit: Admitting: *Deleted

## 2023-08-04 VITALS — Ht 64.0 in | Wt 150.0 lb

## 2023-08-04 DIAGNOSIS — Z0000 Encounter for general adult medical examination without abnormal findings: Secondary | ICD-10-CM

## 2023-08-04 NOTE — Patient Instructions (Signed)
 Shelley Zuniga , Thank you for taking time to come for your Medicare Wellness Visit. I appreciate your ongoing commitment to your health goals. Please review the following plan we discussed and let me know if I can assist you in the future.   Referrals/Orders/Follow-Ups/Clinician Recommendations: Consider updating your cologuard, shingles and covid vaccines.  This is a list of the screening recommended for you and due dates:  Health Maintenance  Topic Date Due   Zoster (Shingles) Vaccine (1 of 2) 01/27/1969   Cologuard (Stool DNA test)  01/03/2023   COVID-19 Vaccine (7 - 2024-25 season) 01/17/2023   Mammogram  07/04/2024   Medicare Annual Wellness Visit  08/03/2024   DTaP/Tdap/Td vaccine (3 - Td or Tdap) 12/07/2028   Pneumonia Vaccine  Completed   Flu Shot  Completed   DEXA scan (bone density measurement)  Completed   Hepatitis C Screening  Completed   HPV Vaccine  Aged Out    Advanced directives: (Copy Requested) Please bring a copy of your health care power of attorney and living will to the office to be added to your chart at your convenience. You can mail to [facility] [address] [place], [place] [address] or email to [email or web address]  Next Medicare Annual Wellness Visit scheduled for next year: Yes 08/09/24 @ 11:30

## 2023-08-04 NOTE — Progress Notes (Signed)
 Subjective:   Shelley Zuniga is a 73 y.o. who presents for a Medicare Wellness preventive visit.  Visit Complete: Virtual I connected with  Shelley Zuniga on 08/04/23 by a audio enabled telemedicine application and verified that I am speaking with the correct person using two identifiers.  Patient Location: Home  Provider Location: Office/Clinic  I discussed the limitations of evaluation and management by telemedicine. The patient expressed understanding and agreed to proceed.  Vital Signs: Because this visit was a virtual/telehealth visit, some criteria may be missing or patient reported. Any vitals not documented were not able to be obtained and vitals that have been documented are patient reported.  VideoDeclined- This patient declined Librarian, academic. Therefore the visit was completed with audio only.  Persons Participating in Visit: Patient.  AWV Questionnaire: No: Patient Medicare AWV questionnaire was not completed prior to this visit.  Cardiac Risk Factors include: advanced age (>53men, >41 women);dyslipidemia;hypertension     Objective:    Today's Vitals   08/04/23 1452  Weight: 150 lb (68 kg)  Height: 5\' 4"  (1.626 m)   Body mass index is 25.75 kg/m.     08/04/2023    3:06 PM 06/05/2023    3:20 PM 07/31/2022   10:55 AM 06/16/2021    1:36 PM 10/10/2020    5:28 AM 06/13/2020   12:08 PM 07/25/2019    8:09 AM  Advanced Directives  Does Patient Have a Medical Advance Directive? Yes Yes Yes No No No No  Type of Estate agent of [place];Living will  Healthcare Power of [employer];Living will      Does patient want to make changes to medical advance directive?   No - Patient declined      Copy of Healthcare Power of Attorney in Chart? No - copy requested  No - copy requested      Would patient like information on creating a medical advance directive?    No - Patient declined No - Patient declined No - Patient declined No -  Patient declined    Current Medications (verified) Outpatient Encounter Medications as of 08/04/2023  Medication Sig   albuterol (VENTOLIN HFA) 108 (90 Base) MCG/ACT inhaler Inhale 2 puffs into the lungs every 6 (six) hours as needed for wheezing or shortness of breath.   amLODipine (NORVASC) 5 MG tablet TAKE 1 TABLET(5 MG) BY MOUTH DAILY   aspirin 81 MG tablet Take 81 mg by mouth daily.   atorvastatin (LIPITOR) 20 MG tablet Take 1 tablet (20 mg total) by mouth daily.   BREO ELLIPTA 100-25 MCG/ACT AEPB INHALE 1 PUFF INTO THE LUNGS DAILY   buPROPion ER (WELLBUTRIN SR) 100 MG 12 hr tablet Take 1 tablet (100 mg total) by mouth 2 (two) times daily.   Cranberry 500 MG CAPS Take 1 capsule by mouth daily.   cyanocobalamin (VITAMIN B12) 1000 MCG/ML injection Inject 1,000 mcg into the muscle every 30 (thirty) days.   Docusate Calcium (STOOL SOFTENER PO) Take 1 capsule by mouth daily.   estradiol (ESTRACE) 1 MG tablet Take 1 mg by mouth daily.   hydrocortisone cream 0.5 % Apply 1 application. topically 2 (two) times daily as needed for itching.   losartan-hydrochlorothiazide (HYZAAR) 100-25 MG tablet Take 1 tablet by mouth daily.   montelukast (SINGULAIR) 10 MG tablet Take 1 tablet (10 mg total) by mouth at bedtime.   PREVIDENT 5000 DRY MOUTH 1.1 % GEL dental gel    VITAMIN D PO Take 400 mg  by mouth daily.   vitamin E 400 UNIT capsule Take 400 Units by mouth daily.   No facility-administered encounter medications on file as of 08/04/2023.    Allergies (verified) Morphine and codeine and Sulfa antibiotics   History: Past Medical History:  Diagnosis Date   B12 neuropathy (HCC) 10/20/2013   Colon polyp 2001   colonoscopy, [facility]    Headache    regularly, not migraines   Heart murmur    Hypertension    Motion sickness    PONV (postoperative nausea and vomiting)    Strabismus age 21   eye surgery   Tick bite of abdominal wall 10/22/2020   Wears hearing aid in both ears    Past Surgical History:   Procedure Laterality Date   ABDOMINAL HYSTERECTOMY  2000   [place], [facility], fibroids/menorrhagia   BASAL CELL CARCINOMA EXCISION Left 2013   Dr. Orson Aloe   BILATERAL OOPHORECTOMY  2000   fibroid cysts   CATARACT EXTRACTION W/PHACO Left 07/11/2019   Procedure: CATARACT EXTRACTION PHACO AND INTRAOCULAR LENS PLACEMENT (IOC) LEFT 8.41 00:49.8 ;  Surgeon: Galen Manila, MD;  [facility];  Service: Ophthalmology;  Laterality: Left;   CATARACT EXTRACTION W/PHACO Right 07/25/2019   Procedure: CATARACT EXTRACTION PHACO AND INTRAOCULAR LENS PLACEMENT (IOC) RIGHT TORIC LENS;  Surgeon: Galen Manila, MD;  Location: [facility];  Service: Ophthalmology;  Laterality: Right;  CDE 4.84 U/S 0:35.7   EYE SURGERY  1956   to correct strabismus   GALLBLADDER SURGERY     HEMORRHOID SURGERY     Family History  Problem Relation Age of Onset   Cancer Mother        melanoma, squamous cell on nose   Hypertension Mother    COPD Mother    Hyperlipidemia Mother    Cancer Father        lung, tobacco user   Cancer Brother        throat, tobacco user   Cancer Maternal Grandmother        uterine/ovarian   Social History   Socioeconomic History   Marital status: Married    Spouse name: Not on file   Number of children: Not on file   Years of education: Not on file   Highest education level: Bachelor's degree (e.g., BA, AB, BS)  Occupational History   Not on file  Tobacco Use   Smoking status: Never   Smokeless tobacco: Never  Vaping Use   Vaping status: Never Used  Substance and Sexual Activity   Alcohol use: No   Drug use: No   Sexual activity: Yes    Birth control/protection: Post-menopausal  Other Topics Concern   Not on file  Social History Narrative   Married   Social Drivers of Health   Financial Resource Strain: Low Risk  (08/04/2023)   Overall Financial Resource Strain (CARDIA)    Difficulty of Paying Living Expenses: Not hard at all  Food  Insecurity: No Food Insecurity (08/04/2023)   Hunger Vital Sign    Worried About Running Out of Food in the Last Year: Never true    Ran Out of Food in the Last Year: Never true  Transportation Needs: No Transportation Needs (08/04/2023)   PRAPARE - Administrator, Civil Service (Medical): No    Lack of Transportation (Non-Medical): No  Physical Activity: Inactive (08/04/2023)   Exercise Vital Sign    Days of Exercise per Week: 0 days    Minutes of Exercise per Session: 0  min  Stress: Stress Concern Present (08/04/2023)   [employer] of Occupational Health - Occupational Stress Questionnaire    Feeling of Stress : To some extent  Social Connections: Socially Integrated (08/04/2023)   Social Connection and Isolation Panel [NHANES]    Frequency of Communication with Friends and Family: More than three times a week    Frequency of Social Gatherings with Friends and Family: More than three times a week    Attends Religious Services: More than 4 times per year    Active Member of [employer] or Organizations: Yes    Attends Engineer, structural: More than 4 times per year    Marital Status: Married    Tobacco Counseling Counseling given: Not Answered    Clinical Intake:  Pre-visit preparation completed: Yes  Pain : No/denies pain     BMI - recorded: 25.75 Nutritional Status: BMI 25 -29 Overweight Nutritional Risks: None Diabetes: No  Lab Results  Component Value Date   HGBA1C 6.3 10/19/2022   HGBA1C 6.0 07/04/2021   HGBA1C 6.1 03/14/2020     How often do you need to have someone help you when you read instructions, pamphlets, or other written materials from your doctor or pharmacy?: 1 - Never  Interpreter Needed?: No  Information entered by :: R. Dannilynn Gallina LPN   Activities of Daily Living     08/04/2023    2:53 PM 07/29/2023   10:09 AM  In your present state of health, do you have any difficulty performing the following activities:  Hearing? 1 1   Comment wears aids   Vision? 0 0  Comment readers   Difficulty concentrating or making decisions? 0 0  Walking or climbing stairs? 0 0  Dressing or bathing? 0 0  Doing errands, shopping? 0 0  Preparing Food and eating ? N N  Using the Toilet? N N  In the past six months, have you accidently leaked urine? Y Y  Do you have problems with loss of bowel control? N N  Managing your Medications? N N  Managing your Finances? N N  Housekeeping or managing your Housekeeping? N N    Patient Care Team: Patterson Hammersmith, MD as PCP - General (Rheumatology) Salena Saner, MD as Consulting Physician (Pulmonary Disease)  Indicate any recent Medical Services you may have received from other than [facility] providers in the past year (date may be approximate).     Assessment:   This is a routine wellness examination for Shelley Zuniga.  Hearing/Vision screen Hearing Screening - Comments:: Wears aids Vision Screening - Comments:: readers   Goals Addressed             This Visit's Progress    Patient Stated       Wants to exercise even if just walking       Depression Screen     08/04/2023    3:00 PM 12/07/2022    3:35 PM 10/19/2022   10:42 AM 07/31/2022   10:54 AM 04/16/2022   12:09 PM 06/27/2021   11:09 AM 06/27/2021   11:04 AM  PHQ 2/9 Scores  PHQ - 2 Score 1 0 1 0 0 2 2  PHQ- 9 Score 4 3 6   9      Fall Risk     08/04/2023    2:54 PM 07/29/2023   10:09 AM 12/07/2022    3:35 PM 10/19/2022   10:42 AM 07/29/2022    8:30 PM  Fall Risk   Falls in the  past year? 0 0 0 1 1  Number falls in past yr: 0 0 0 0 0  Injury with Fall? 0 0 0 1 1  Comment     Swollen ankle  Risk for fall due to : No Fall Risks  No Fall Risks History of fall(s)   Follow up Falls prevention discussed;Falls evaluation completed  Falls evaluation completed Falls evaluation completed Falls evaluation completed;Falls prevention discussed    MEDICARE RISK AT HOME:  Medicare Risk at Home Any stairs in or around the home?:  Yes If so, are there any without handrails?: Yes Home free of loose throw rugs in walkways, pet beds, electrical cords, etc?: No (discussed backing for the rugs) Adequate lighting in your home to reduce risk of falls?: Yes Life alert?: No Use of a cane, walker or w/c?: No Grab bars in the bathroom?: No Shower chair or bench in shower?: Yes Elevated toilet seat or a handicapped toilet?: Yes  TIMED UP AND GO:  Was the test performed?  No  Cognitive Function: 6CIT completed        08/04/2023    3:06 PM 07/31/2022   11:05 AM 06/13/2019   11:27 AM  6CIT Screen  What Year? 0 points 0 points 0 points  What month? 0 points 0 points 0 points  What time? 0 points 0 points 0 points  Count back from 20 0 points 0 points 0 points  Months in reverse 0 points 0 points 0 points  Repeat phrase 0 points 0 points   Total Score 0 points 0 points     Immunizations Immunization History  Administered Date(s) Administered   Fluad Quad(high Dose 65+) 03/19/2020, 02/14/2021   Fluad Trivalent(High Dose 65+) 03/08/2023   Influenza Split 03/26/2012   Influenza, High Dose Seasonal PF 02/22/2019, 03/13/2022   Influenza,inj,Quad PF,6+ Mos 02/22/2013, 02/10/2014   Influenza-Unspecified 04/04/2016, 03/13/2017, 01/31/2018   PFIZER Comirnaty(Gray Top)Covid-19 Tri-Sucrose Vaccine 05/25/2019, 06/15/2019   PFIZER(Purple Top)SARS-COV-2 Vaccination 05/25/2019, 06/15/2019, 10/05/2019, 02/12/2020   Pneumococcal Conjugate-13 05/15/2016   Pneumococcal Polysaccharide-23 03/14/2020   Rsv, Bivalent, Protein Subunit Rsvpref,pf (Abrysvo) 05/27/2022   Td 12/08/2018   Tdap 01/20/2010   Zoster, Live 10/19/2013    Screening Tests Health Maintenance  Topic Date Due   Zoster Vaccines- Shingrix (1 of 2) 01/27/1969   Fecal DNA (Cologuard)  01/03/2023   COVID-19 Vaccine (7 - 2024-25 season) 01/17/2023   MAMMOGRAM  07/04/2024   Medicare Annual Wellness (AWV)  08/03/2024   DTaP/Tdap/Td (3 - Td or Tdap) 12/07/2028    Pneumonia Vaccine 77+ Years old  Completed   INFLUENZA VACCINE  Completed   DEXA SCAN  Completed   Hepatitis C Screening  Completed   HPV VACCINES  Aged Out    Health Maintenance  Health Maintenance Due  Topic Date Due   Zoster Vaccines- Shingrix (1 of 2) 01/27/1969   Fecal DNA (Cologuard)  01/03/2023   COVID-19 Vaccine (7 - 2024-25 season) 01/17/2023   Health Maintenance Items Addressed: Discussed the need to update shingles and covid vaccines. Patient declines cologuard at this time.  Additional Screening:  Vision Screening: Recommended annual ophthalmology exams for early detection of glaucoma and other disorders of the eye. Up to date [facility]  Dental Screening: Recommended annual dental exams for proper oral hygiene  Community Resource Referral / Chronic Care Management: CRR required this visit?  No   CCM required this visit?  No     Plan:     I have personally reviewed and noted  the following in the patient's chart:   Medical and social history Use of alcohol, tobacco or illicit drugs  Current medications and supplements including opioid prescriptions. Patient is not currently taking opioid prescriptions. Functional ability and status Nutritional status Physical activity Advanced directives List of other physicians Hospitalizations, surgeries, and ER visits in previous 12 months Vitals Screenings to include cognitive, depression, and falls Referrals and appointments  In addition, I have reviewed and discussed with patient certain preventive protocols, quality metrics, and best practice recommendations. A written personalized care plan for preventive services as well as general preventive health recommendations were provided to patient.     Sydell Axon, LPN   07/23/6576   After Visit Summary: (MyChart) Due to this being a telephonic visit, the after visit summary with patients personalized plan was offered to patient via MyChart   Notes: Please refer  to Routing Comments.

## 2023-08-09 ENCOUNTER — Ambulatory Visit: Payer: Medicare PPO

## 2023-08-09 DIAGNOSIS — E538 Deficiency of other specified B group vitamins: Secondary | ICD-10-CM | POA: Diagnosis not present

## 2023-08-09 MED ORDER — CYANOCOBALAMIN 1000 MCG/ML IJ SOLN
1000.0000 ug | Freq: Once | INTRAMUSCULAR | Status: AC
  Administered 2023-08-09: 1000 ug via INTRAMUSCULAR

## 2023-08-09 NOTE — Progress Notes (Signed)
 Pt presented for their vitamin B12 injection. Pt was identified through two identifiers. Pt tolerated shot well in their right deltoid.

## 2023-08-22 ENCOUNTER — Encounter: Payer: Self-pay | Admitting: Internal Medicine

## 2023-08-23 MED ORDER — MONTELUKAST SODIUM 10 MG PO TABS: 10.0000 mg | ORAL_TABLET | Freq: Every day | ORAL | 0 refills | Status: DC

## 2023-09-13 ENCOUNTER — Ambulatory Visit (INDEPENDENT_AMBULATORY_CARE_PROVIDER_SITE_OTHER)

## 2023-09-13 DIAGNOSIS — E538 Deficiency of other specified B group vitamins: Secondary | ICD-10-CM | POA: Diagnosis not present

## 2023-09-13 MED ORDER — CYANOCOBALAMIN 1000 MCG/ML IJ SOLN
1000.0000 ug | Freq: Once | INTRAMUSCULAR | Status: AC
  Administered 2023-09-13: 1000 ug via INTRAMUSCULAR

## 2023-09-13 NOTE — Progress Notes (Signed)
 Pt presented for their vitamin B12 injection. Pt was identified through two identifiers. Pt tolerated shot well in their left deltoid.

## 2023-09-14 ENCOUNTER — Other Ambulatory Visit: Payer: Self-pay | Admitting: Internal Medicine

## 2023-09-16 DIAGNOSIS — H903 Sensorineural hearing loss, bilateral: Secondary | ICD-10-CM | POA: Diagnosis not present

## 2023-10-07 ENCOUNTER — Ambulatory Visit: Admitting: Internal Medicine

## 2023-10-07 ENCOUNTER — Encounter: Payer: Self-pay | Admitting: Internal Medicine

## 2023-10-07 VITALS — BP 128/74 | HR 80 | Ht 64.0 in | Wt 156.8 lb

## 2023-10-07 DIAGNOSIS — R195 Other fecal abnormalities: Secondary | ICD-10-CM | POA: Insufficient documentation

## 2023-10-07 DIAGNOSIS — F339 Major depressive disorder, recurrent, unspecified: Secondary | ICD-10-CM

## 2023-10-07 DIAGNOSIS — I10 Essential (primary) hypertension: Secondary | ICD-10-CM | POA: Diagnosis not present

## 2023-10-07 DIAGNOSIS — R7301 Impaired fasting glucose: Secondary | ICD-10-CM | POA: Diagnosis not present

## 2023-10-07 DIAGNOSIS — R3912 Poor urinary stream: Secondary | ICD-10-CM | POA: Insufficient documentation

## 2023-10-07 DIAGNOSIS — M1812 Unilateral primary osteoarthritis of first carpometacarpal joint, left hand: Secondary | ICD-10-CM

## 2023-10-07 DIAGNOSIS — Z79899 Other long term (current) drug therapy: Secondary | ICD-10-CM | POA: Diagnosis not present

## 2023-10-07 DIAGNOSIS — R238 Other skin changes: Secondary | ICD-10-CM | POA: Insufficient documentation

## 2023-10-07 DIAGNOSIS — E782 Mixed hyperlipidemia: Secondary | ICD-10-CM

## 2023-10-07 DIAGNOSIS — Z1211 Encounter for screening for malignant neoplasm of colon: Secondary | ICD-10-CM

## 2023-10-07 DIAGNOSIS — R102 Pelvic and perineal pain: Secondary | ICD-10-CM

## 2023-10-07 LAB — LIPID PANEL
Cholesterol: 174 mg/dL (ref 0–200)
HDL: 71.6 mg/dL (ref >39.00)
LDL Cholesterol: 77 mg/dL (ref 0–99)
NonHDL: 102.56
Total CHOL/HDL Ratio: 2
Triglycerides: 128 mg/dL (ref 0.0–149.0)
VLDL: 25.6 mg/dL (ref 0.0–40.0)

## 2023-10-07 LAB — COMPREHENSIVE METABOLIC PANEL WITH GFR
ALT: 14 U/L (ref 0–35)
AST: 11 U/L (ref 0–37)
Albumin: 4.3 g/dL (ref 3.5–5.2)
Alkaline Phosphatase: 57 U/L (ref 39–117)
BUN: 16 mg/dL (ref 6–23)
CO2: 32 meq/L (ref 19–32)
Calcium: 9.6 mg/dL (ref 8.4–10.5)
Chloride: 100 meq/L (ref 96–112)
Creatinine, Ser: 0.8 mg/dL (ref 0.40–1.20)
GFR: 72.96 mL/min (ref >60.00)
Glucose, Bld: 100 mg/dL — ABNORMAL HIGH (ref 70–99)
Potassium: 3.6 meq/L (ref 3.5–5.1)
Sodium: 138 meq/L (ref 135–145)
Total Bilirubin: 0.6 mg/dL (ref 0.2–1.2)
Total Protein: 7.1 g/dL (ref 6.0–8.3)

## 2023-10-07 LAB — URINALYSIS, ROUTINE W REFLEX MICROSCOPIC
Bilirubin Urine: NEGATIVE
Hgb urine dipstick: NEGATIVE
Ketones, ur: NEGATIVE
Leukocytes,Ua: NEGATIVE
Nitrite: NEGATIVE
RBC / HPF: NONE SEEN (ref 0–?)
Specific Gravity, Urine: 1.01 (ref 1.000–1.030)
Total Protein, Urine: NEGATIVE
Urine Glucose: NEGATIVE
Urobilinogen, UA: 0.2 (ref 0.0–1.0)
WBC, UA: NONE SEEN (ref 0–?)
pH: 6.5 (ref 5.0–8.0)

## 2023-10-07 LAB — CBC WITH DIFFERENTIAL/PLATELET
Basophils Absolute: 0 10*3/uL (ref 0.0–0.1)
Basophils Relative: 0.5 % (ref 0.0–3.0)
Eosinophils Absolute: 0.2 10*3/uL (ref 0.0–0.7)
Eosinophils Relative: 2.4 % (ref 0.0–5.0)
HCT: 41.3 % (ref 36.0–46.0)
Hemoglobin: 14.2 g/dL (ref 12.0–15.0)
Lymphocytes Relative: 27.4 % (ref 12.0–46.0)
Lymphs Abs: 1.8 10*3/uL (ref 0.7–4.0)
MCHC: 34.3 g/dL (ref 30.0–36.0)
MCV: 94.1 fl (ref 78.0–100.0)
Monocytes Absolute: 0.5 10*3/uL (ref 0.1–1.0)
Monocytes Relative: 8.3 % (ref 3.0–12.0)
Neutro Abs: 4 10*3/uL (ref 1.4–7.7)
Neutrophils Relative %: 61.4 % (ref 43.0–77.0)
Platelets: 266 10*3/uL (ref 150.0–400.0)
RBC: 4.39 Mil/uL (ref 3.87–5.11)
RDW: 12.5 % (ref 11.5–15.5)
WBC: 6.4 10*3/uL (ref 4.0–10.5)

## 2023-10-07 LAB — MICROALBUMIN / CREATININE URINE RATIO
Creatinine,U: 37.1 mg/dL
Microalb Creat Ratio: UNDETERMINED mg/g (ref 0.0–30.0)
Microalb, Ur: <0.7 mg/dL

## 2023-10-07 LAB — LDL CHOLESTEROL, DIRECT: Direct LDL: 80 mg/dL

## 2023-10-07 LAB — HEMOGLOBIN A1C: Hgb A1c MFr Bld: 6 % (ref 4.6–6.5)

## 2023-10-07 LAB — TSH: TSH: 1.25 u[IU]/mL (ref 0.35–5.50)

## 2023-10-07 MED ORDER — LOSARTAN POTASSIUM-HCTZ 100-25 MG PO TABS: 1.0000 | ORAL_TABLET | Freq: Every day | ORAL | 1 refills | Status: DC

## 2023-10-07 MED ORDER — NYSTATIN 100000 UNIT/GM EX CREA: 1.0000 | TOPICAL_CREAM | Freq: Two times a day (BID) | CUTANEOUS | 0 refills | Status: AC

## 2023-10-07 MED ORDER — MONTELUKAST SODIUM 10 MG PO TABS: 10.0000 mg | ORAL_TABLET | Freq: Every day | ORAL | 1 refills | Status: DC

## 2023-10-07 MED ORDER — TRIAMCINOLONE ACETONIDE 0.1 % EX CREA: 1.0000 | TOPICAL_CREAM | Freq: Two times a day (BID) | CUTANEOUS | 0 refills | Status: AC

## 2023-10-07 NOTE — Assessment & Plan Note (Signed)
 Has been managed for over 5 years with wellbutrin .  Mood is normal despite multiple family adverse events.  No changes today

## 2023-10-07 NOTE — Assessment & Plan Note (Signed)
 Left thenar area,  due to pronged contact with wet surface of brace.  Nystatin /triamcinloone cream

## 2023-10-07 NOTE — Progress Notes (Signed)
 Subjective:  Patient ID: Shelley Zuniga, female    DOB: 12-Nov-1950  Age: 73 y.o. MRN: [id number]  CC: The primary encounter diagnosis was Primary hypertension. Diagnoses of Impaired fasting glucose, Mixed hyperlipidemia, Long-term use of high-risk medication, Suprapubic cramping, Change in stool caliber, Weak urinary stream, Major depressive disorder, recurrent episode with melancholic features (HCC), Osteoarthritis of thumb, left, and Skin irritation were also pertinent to this visit.   HPI [name] presents for  Chief Complaint  Patient presents with   Medical Management of Chronic Issues    6 month follow up     1) HTN:  Patient is taking her medications as prescribed and notes no adverse effects.  Home BP readings have been done about once per week and are  generally < 130/80 .  She is avoiding added salt in her diet and walking regularly about 3 times per week for exercise  .   2)  since March she has had intermittent  lower abdominal  cramping and spasms , a feeling of needing to use the bathroom but urinary stream is hesitant and weak   no hematuria or dysuria.    Stools have changed in caliber and are consistently  thinner than unusual.   Denies blood in stools.  Last colon CA reviewed,  cologuard negative in 2022.  S/p TAH/BSO  3) Right CMC arthritis,  wearing a  brace  underlying skin irritated from moisture trapped under her brace. Area is red and itchy   Outpatient Medications Prior to Visit  Medication Sig Dispense Refill   albuterol  (VENTOLIN  HFA) 108 (90 Base) MCG/ACT inhaler Inhale 2 puffs into the lungs every 6 (six) hours as needed for wheezing or shortness of breath. 17 each 2   amLODipine  (NORVASC ) 5 MG tablet TAKE 1 TABLET(5 MG) BY MOUTH DAILY 90 tablet 1   aspirin 81 MG tablet Take 81 mg by mouth daily.     atorvastatin  (LIPITOR) 20 MG tablet Take 1 tablet (20 mg total) by mouth daily. 90 tablet 3   BREO ELLIPTA  100-25 MCG/ACT AEPB INHALE 1 PUFF INTO THE LUNGS  DAILY 60 each 6   buPROPion  ER (WELLBUTRIN  SR) 100 MG 12 hr tablet TAKE 1 TABLET(100 MG) BY MOUTH TWICE DAILY 180 tablet 1   Cranberry 500 MG CAPS Take 1 capsule by mouth daily.     cyanocobalamin  (VITAMIN B12) 1000 MCG/ML injection Inject 1,000 mcg into the muscle every 30 (thirty) days.     Docusate Calcium  (STOOL SOFTENER PO) Take 1 capsule by mouth daily.     estradiol (ESTRACE) 1 MG tablet Take 1 mg by mouth daily.     hydrocortisone  cream 0.5 % Apply 1 application. topically 2 (two) times daily as needed for itching. 30 g 5   PREVIDENT 5000 DRY MOUTH 1.1 % GEL dental gel      VITAMIN D  PO Take 400 mg by mouth daily.     vitamin E 400 UNIT capsule Take 400 Units by mouth daily.     losartan -hydrochlorothiazide (HYZAAR) 100-25 MG tablet Take 1 tablet by mouth daily. 90 tablet 1   montelukast  (SINGULAIR ) 10 MG tablet Take 1 tablet (10 mg total) by mouth at bedtime. 90 tablet 0   No facility-administered medications prior to visit.    Review of Systems;  Patient denies headache, fevers, malaise, unintentional weight loss, skin rash, eye pain, sinus congestion and sinus pain, sore throat, dysphagia,  hemoptysis , cough, dyspnea, wheezing, chest pain, palpitations, orthopnea, edema, abdominal  pain, nausea, melena, diarrhea, constipation, flank pain, dysuria, hematuria, urinary  Frequency, nocturia, numbness, tingling, seizures,  Focal weakness, Loss of consciousness,  Tremor, insomnia, depression, anxiety, and suicidal ideation.      Objective:  BP 128/74   Pulse 80   Ht 5\' 4"  (1.626 m)   Wt 156 lb 12.8 oz (71.1 kg)   SpO2 95%   BMI 26.91 kg/m   BP Readings from Last 3 Encounters:  10/07/23 128/74  07/08/23 122/78  06/05/23 (!) 143/77    Wt Readings from Last 3 Encounters:  10/07/23 156 lb 12.8 oz (71.1 kg)  08/04/23 150 lb (68 kg)  07/08/23 155 lb (70.3 kg)    Physical Exam Vitals reviewed.  Constitutional:      General: She is not in acute distress.    Appearance:  Normal appearance. She is normal weight. She is not ill-appearing, toxic-appearing or diaphoretic.  HENT:     Head: Normocephalic.  Eyes:     General: No scleral icterus.       Right eye: No discharge.        Left eye: No discharge.     Conjunctiva/sclera: Conjunctivae normal.  Cardiovascular:     Rate and Rhythm: Normal rate and regular rhythm.     Heart sounds: Normal heart sounds.  Pulmonary:     Effort: Pulmonary effort is normal. No respiratory distress.     Breath sounds: Normal breath sounds.  Musculoskeletal:        General: Normal range of motion.  Skin:    General: Skin is warm and dry.     Findings: Erythema and rash present.          Comments: Left thenar area   Neurological:     General: No focal deficit present.     Mental Status: She is alert and oriented to person, place, and time. Mental status is at baseline.  Psychiatric:        Mood and Affect: Mood normal.        Behavior: Behavior normal.        Thought Content: Thought content normal.        Judgment: Judgment normal.    Lab Results  Component Value Date   HGBA1C 6.3 10/19/2022   HGBA1C 6.0 07/04/2021   HGBA1C 6.1 03/14/2020    Lab Results  Component Value Date   CREATININE 0.84 10/19/2022   CREATININE 0.79 04/16/2022   CREATININE 0.75 07/04/2021    Lab Results  Component Value Date   WBC 8.8 10/19/2022   HGB 14.6 10/19/2022   HCT 42.9 10/19/2022   PLT 328.0 10/19/2022   GLUCOSE 107 (H) 10/19/2022   CHOL 171 10/19/2022   TRIG 175.0 (H) 10/19/2022   HDL 68.00 10/19/2022   LDLDIRECT 84.0 10/19/2022   LDLCALC 68 10/19/2022   ALT 13 10/19/2022   AST 11 10/19/2022   NA 137 10/19/2022   K 3.9 10/19/2022   CL 98 10/19/2022   CREATININE 0.84 10/19/2022   BUN 17 10/19/2022   CO2 31 10/19/2022   TSH 1.56 10/19/2022   HGBA1C 6.3 10/19/2022   MICROALBUR <0.7 10/19/2022    ECHOCARDIOGRAM COMPLETE Result Date: 08/02/2023    ECHOCARDIOGRAM REPORT   Patient Name:   Shelley Zuniga Date of  Exam: 07/30/2023 Medical Rec #:  [id number]      Height:       64.0 in Accession #:    [id number]     Weight:       155.0  lb Date of Birth:  Oct 01, 1950      BSA:          1.756 m Patient Age:    72 years       BP:           122/78 mmHg Patient Gender: F              HR:           77 bpm. Exam Location:  [facility] Procedure: 2D Echo, 3D Echo, Color Doppler, Cardiac Doppler and Strain Analysis            (Both Spectral and Color Flow Doppler were utilized during            procedure). Indications:     Dyspnea on exertion R06.09                  Dyspnea R06.00  History:         Patient has no prior history of Echocardiogram examinations.                  Signs/Symptoms:Murmur; Risk Factors:Hypertension.  Sonographer:     Broadus Canes Referring Phys:  [id number] CARMEN Toni Frank Diagnosing Phys: Antionette Kirks MD  Sonographer Comments: Global longitudinal strain was attempted. IMPRESSIONS  1. Left ventricular ejection fraction, by estimation, is 60 to 65%. Left ventricular ejection fraction by 3D volume is 52 %. The left ventricle has normal function. The left ventricle has no regional wall motion abnormalities. There is mild left ventricular hypertrophy. Left ventricular diastolic parameters are consistent with Grade I diastolic dysfunction (impaired relaxation). The average left ventricular global longitudinal strain is -16.6 %. The global longitudinal strain is normal.  2. Right ventricular systolic function is normal. The right ventricular size is normal. There is normal pulmonary artery systolic pressure.  3. The mitral valve is normal in structure. No evidence of mitral valve regurgitation. No evidence of mitral stenosis.  4. The aortic valve is normal in structure. Aortic valve regurgitation is not visualized. Aortic valve sclerosis/calcification is present, without any evidence of aortic stenosis. FINDINGS  Left Ventricle: Left ventricular ejection fraction, by estimation, is 60 to 65%. Left ventricular ejection fraction by  3D volume is 52 %. The left ventricle has normal function. The left ventricle has no regional wall motion abnormalities. The average left ventricular global longitudinal strain is -16.6 %. Strain was performed and the global longitudinal strain is normal. The left ventricular internal cavity size was normal in size. There is mild left ventricular hypertrophy. Left ventricular diastolic parameters are consistent with Grade I diastolic dysfunction (impaired relaxation). Right Ventricle: The right ventricular size is normal. No increase in right ventricular wall thickness. Right ventricular systolic function is normal. There is normal pulmonary artery systolic pressure. The tricuspid regurgitant velocity is 2.17 m/s, and  with an assumed right atrial pressure of 5 mmHg, the estimated right ventricular systolic pressure is 23.8 mmHg. Left Atrium: Left atrial size was normal in size. Right Atrium: Right atrial size was normal in size. Pericardium: There is no evidence of pericardial effusion. Mitral Valve: The mitral valve is normal in structure. No evidence of mitral valve regurgitation. No evidence of mitral valve stenosis. MV peak gradient, 6.5 mmHg. The mean mitral valve gradient is 2.0 mmHg. Tricuspid Valve: The tricuspid valve is normal in structure. Tricuspid valve regurgitation is trivial. No evidence of tricuspid stenosis. Aortic Valve: The aortic valve is normal in structure. Aortic valve regurgitation is not visualized. Aortic valve  sclerosis/calcification is present, without any evidence of aortic stenosis. Aortic valve mean gradient measures 3.0 mmHg. Aortic valve peak  gradient measures 6.5 mmHg. Aortic valve area, by VTI measures 3.25 cm. Pulmonic Valve: The pulmonic valve was normal in structure. Pulmonic valve regurgitation is not visualized. No evidence of pulmonic stenosis. Aorta: The aortic root is normal in size and structure. IAS/Shunts: No atrial level shunt detected by color flow Doppler.  Additional Comments: 3D was performed not requiring image post processing on an independent workstation and was normal.  LEFT VENTRICLE PLAX 2D LVIDd:         3.70 cm         Diastology LVIDs:         2.10 cm         LV e' medial:    9.46 cm/s LV PW:         1.00 cm         LV E/e' medial:  9.4 LV IVS:        1.30 cm         LV e' lateral:   7.83 cm/s LVOT diam:     2.00 cm         LV E/e' lateral: 11.4 LV SV:         82 LV SV Index:   47              2D Longitudinal LVOT Area:     3.14 cm        Strain                                2D Strain GLS   -16.6 %                                Avg:                                 3D Volume EF                                LV 3D EF:    Left                                             ventricul                                             ar                                             ejection                                             fraction  by 3D                                             volume is                                             52 %.                                 3D Volume EF:                                3D EF:        52 %                                LV EDV:       101 ml                                LV ESV:       49 ml                                LV SV:        52 ml RIGHT VENTRICLE RV Basal diam:  2.30 cm RV Mid diam:    2.00 cm LEFT ATRIUM           Index        RIGHT ATRIUM          Index LA diam:      2.40 cm 1.37 cm/m   RA Area:     9.16 cm LA Vol (A2C): 14.2 ml 8.09 ml/m   RA Volume:   15.20 ml 8.66 ml/m LA Vol (A4C): 23.7 ml 13.50 ml/m  AORTIC VALVE AV Area (Vmax):    3.09 cm AV Area (Vmean):   3.33 cm AV Area (VTI):     3.25 cm AV Vmax:           127.00 cm/s AV Vmean:          81.800 cm/s AV VTI:            0.253 m AV Peak Grad:      6.5 mmHg AV Mean Grad:      3.0 mmHg LVOT Vmax:         125.00 cm/s LVOT Vmean:        86.600 cm/s LVOT VTI:          0.262 m LVOT/AV VTI ratio: 1.04   AORTA Ao Root diam: 3.00 cm MITRAL VALVE                TRICUSPID VALVE MV Area (PHT): 3.36 cm     TR Peak grad:   18.8 mmHg MV Area VTI:   3.05 cm     TR Vmax:        217.00 cm/s MV Peak grad:  6.5 mmHg MV Mean grad:  2.0 mmHg     SHUNTS MV  Vmax:       1.27 m/s     Systemic VTI:  0.26 m MV Vmean:      69.0 cm/s    Systemic Diam: 2.00 cm MV Decel Time: 226 msec MV E velocity: 89.10 cm/s MV A velocity: 119.00 cm/s MV E/A ratio:  0.75 Antionette Kirks MD Electronically signed by Antionette Kirks MD Signature Date/Time: 08/02/2023/8:23:25 AM    Final     Assessment & Plan:  .Primary hypertension Assessment & Plan: Home readings have been < 120/70 on amlodipine  5 mg.  Losartan /hct 100/25.  No changes today   Orders: -     Comprehensive metabolic panel with GFR -     Microalbumin / creatinine urine ratio  Impaired fasting glucose -     Comprehensive metabolic panel with GFR -     Hemoglobin A1c  Mixed hyperlipidemia -     Lipid panel -     LDL cholesterol, direct  Long-term use of high-risk medication -     CBC with Differential/Platelet -     TSH  Suprapubic cramping -     Urinalysis, Routine w reflex microscopic -     Urine Culture -     CT ABDOMEN PELVIS W WO CONTRAST; Future -     Ambulatory referral to Gastroenterology  Change in stool caliber Assessment & Plan: Persistent for 2 months ,  accompanied  by suprapubic cramping and weak urinary stream.. CT abd/pelvis followed by GI consult ordered to rule out mass  Orders: -     CT ABDOMEN PELVIS W WO CONTRAST; Future -     Ambulatory referral to Gastroenterology  Weak urinary stream -     CT ABDOMEN PELVIS W WO CONTRAST; Future  Major depressive disorder, recurrent episode with melancholic features [facility]) Assessment & Plan: Has been managed for over 5 years with wellbutrin .  Mood is normal despite multiple family adverse events.  No changes today    Osteoarthritis of thumb, left Assessment & Plan: Improved with PT and use of  thumb brace. Can use voltaren gel prn .   Skin irritation Assessment & Plan: Left thenar area,  due to pronged contact with wet surface of brace.  Nystatin /triamcinloone cream    Other orders -     Losartan  Potassium-HCTZ; Take 1 tablet by mouth daily.  Dispense: 90 tablet; Refill: 1 -     Montelukast  Sodium; Take 1 tablet (10 mg total) by mouth at bedtime.  Dispense: 90 tablet; Refill: 1 -     Triamcinolone Acetonide; Apply 1 Application topically 2 (two) times daily.  Dispense: 30 g; Refill: 0 -     Nystatin; Apply 1 Application topically 2 (two) times daily.  Dispense: 30 g; Refill: 0     I spent 34 minutes on the day of this face to face encounter reviewing patient's  most recent visit with orthopedics,  prior relevant surgical and non surgical procedures, recent  labs and imaging studies, counseling on weight management,  reviewing the assessment and plan with patient, and post visit ordering and reviewing of  diagnostics and therapeutics with patient  .   Follow-up: No follow-ups on file.   Thersia Flax, MD

## 2023-10-07 NOTE — Assessment & Plan Note (Signed)
 Improved with PT and use of thumb brace. Can use voltaren gel prn .

## 2023-10-07 NOTE — Assessment & Plan Note (Signed)
 Persistent for 2 months ,  accompanied  by suprapubic cramping and weak urinary stream.. CT abd/pelvis followed by GI consult ordered to rule out mass

## 2023-10-07 NOTE — Assessment & Plan Note (Signed)
 Home readings have been < 120/70 on amlodipine  5 mg.  Losartan /hct 100/25.  No changes today

## 2023-10-07 NOTE — Patient Instructions (Signed)
 Labs and urinalyses today  CT abomen and pelvis ordered  along with GI referral for colonoscopu   For the hand rash:  nystatin cream and triamcinolone cream

## 2023-10-08 LAB — URINE CULTURE
MICRO NUMBER:: 16489763
SPECIMEN QUALITY:: ADEQUATE

## 2023-10-09 ENCOUNTER — Ambulatory Visit: Payer: Self-pay | Admitting: Internal Medicine

## 2023-10-12 ENCOUNTER — Ambulatory Visit
Admission: RE | Admit: 2023-10-12 | Discharge: 2023-10-12 | Disposition: A | Discharge status: Home / Self Care | Source: Ambulatory Visit | Attending: Internal Medicine | Admitting: Internal Medicine

## 2023-10-12 DIAGNOSIS — R3912 Poor urinary stream: Secondary | ICD-10-CM | POA: Diagnosis not present

## 2023-10-12 DIAGNOSIS — R102 Pelvic and perineal pain: Secondary | ICD-10-CM | POA: Insufficient documentation

## 2023-10-12 DIAGNOSIS — K449 Diaphragmatic hernia without obstruction or gangrene: Secondary | ICD-10-CM | POA: Diagnosis not present

## 2023-10-12 DIAGNOSIS — R195 Other fecal abnormalities: Secondary | ICD-10-CM | POA: Insufficient documentation

## 2023-10-12 DIAGNOSIS — N281 Cyst of kidney, acquired: Secondary | ICD-10-CM | POA: Diagnosis not present

## 2023-10-12 DIAGNOSIS — R1030 Lower abdominal pain, unspecified: Secondary | ICD-10-CM | POA: Diagnosis not present

## 2023-10-12 MED ORDER — IOHEXOL 300 MG/ML  SOLN
100.0000 mL | Freq: Once | INTRAMUSCULAR | Status: AC | PRN
  Administered 2023-10-12: 100 mL via INTRAVENOUS

## 2023-10-18 ENCOUNTER — Ambulatory Visit

## 2023-10-20 ENCOUNTER — Ambulatory Visit: Admitting: *Deleted

## 2023-10-20 DIAGNOSIS — E538 Deficiency of other specified B group vitamins: Secondary | ICD-10-CM

## 2023-10-20 MED ORDER — CYANOCOBALAMIN 1000 MCG/ML IJ SOLN
1000.0000 ug | Freq: Once | INTRAMUSCULAR | Status: AC
  Administered 2023-10-20: 1000 ug via INTRAMUSCULAR

## 2023-10-20 NOTE — Progress Notes (Addendum)
Pt received B12 injection in right deltoid muscle. Pt tolerated it well with no complaints or concerns.  

## 2023-11-09 ENCOUNTER — Telehealth: Payer: Self-pay | Admitting: Internal Medicine

## 2023-11-09 DIAGNOSIS — E782 Mixed hyperlipidemia: Secondary | ICD-10-CM

## 2023-11-09 DIAGNOSIS — E559 Vitamin D deficiency, unspecified: Secondary | ICD-10-CM

## 2023-11-09 DIAGNOSIS — I10 Essential (primary) hypertension: Secondary | ICD-10-CM

## 2023-11-09 DIAGNOSIS — E538 Deficiency of other specified B group vitamins: Secondary | ICD-10-CM

## 2023-11-09 NOTE — Telephone Encounter (Signed)
 Patient need labs ordered please

## 2023-11-11 ENCOUNTER — Ambulatory Visit: Admitting: Pulmonary Disease

## 2023-11-11 ENCOUNTER — Encounter: Payer: Self-pay | Admitting: Pulmonary Disease

## 2023-11-11 VITALS — BP 116/70 | HR 73 | Temp 98.5°F | Ht 64.0 in | Wt 157.4 lb

## 2023-11-11 DIAGNOSIS — J4540 Moderate persistent asthma, uncomplicated: Secondary | ICD-10-CM | POA: Diagnosis not present

## 2023-11-11 DIAGNOSIS — R911 Solitary pulmonary nodule: Secondary | ICD-10-CM

## 2023-11-11 DIAGNOSIS — R0609 Other forms of dyspnea: Secondary | ICD-10-CM | POA: Diagnosis not present

## 2023-11-11 LAB — NITRIC OXIDE: Nitric Oxide: 20

## 2023-11-11 NOTE — Patient Instructions (Signed)
 VISIT SUMMARY:  Today, you came in for a follow-up visit to check on your moderate persistent asthma. You reported no current asthmatic symptoms, and your condition is well controlled.  YOUR PLAN:  -MODERATE PERSISTENT ASTHMA: Moderate persistent asthma is a type of asthma that requires daily medication to control symptoms and prevent flare-ups. Your asthma is well-controlled, and your nitric oxide  level is 20, indicating good control of inflammation. Continue using Breo as part of your treatment regimen.  INSTRUCTIONS:  Please follow up in 6 months for your next check-up.

## 2023-11-11 NOTE — Progress Notes (Signed)
 Subjective:    Patient ID: Shelley Zuniga, female    DOB: 07/10/1950, 73 y.o.   MRN: [id number]  Patient Care Team: Marylynn Verneita CROME, MD as PCP - General (Internal Medicine) Tamea Dedra CROME, MD as Consulting Physician (Pulmonary Disease)  Chief Complaint  Patient presents with   Follow-up    BACKGROUND/INTERVAL: Shelley Zuniga is a 73 year old lifelong never smoker who presents here for the follow-up on the issue of dyspnea in the setting of moderate persistent asthma.  She was last seen on 08 July 2023.    HPI Discussed the use of AI scribe software for clinical note transcription with the patient, who gave verbal consent to proceed.  History of Present Illness   Shelley Zuniga is a 73 year old female with moderate persistent asthma who presents for a follow-up visit.  She has no current asthmatic symptoms and her condition is well controlled. She continues to use Breo as part of her treatment regimen.  Since her last visit she has not had any exacerbations.  Has not had any difficulties obtaining Breo.  She does not use her albuterol  at inhaler at all since she started using Breo.  No recent fevers, chills or sweats.  No cough, sputum production nor hemoptysis.  No gastroesophageal reflux symptoms.  Overall she feels well and looks well.  DATA 07/15/2021 PFTs: FEV1 1.68 L or 75% predicted, FVC 2.96 L or 106% predicted, FEV1/FVC 83%, significant bronchodilator response with a net change of 21% and FEV1 diffusion capacity normal, lung volumes normal, consistent with moderate obstructive airways disease with significant bronchodilator response (asthmatic type). 08/08/2021 CT chest without contrast: Stable subcentimeter pulmonary nodules largest with mean diameter of 5 mm in the left lower lobe.  Given stability and lack of documented risk factors no specific imaging follow-up is required.  Small hiatal hernia noted. 06/04/2022 PFTs: FEV1 1.62 L or 105% predicted, FVC 2.23 L or 107%  predicted, FEV1/FVC 66%, postbronchodilator her lung volumes normalized.  Normal diffusion capacity.  Consistent with mild obstructive defect asthmatic type.  Review of Systems A 10 point review of systems was performed and it is as noted above otherwise negative.   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 11/21/2016   Impaired fasting glucose 05/15/2016   Major depressive disorder, recurrent episode with melancholic features (HCC) 11/15/2015   Shingles outbreak 10/01/2014   B12 deficiency 10/20/2013   Vitamin D  deficiency 10/20/2013   Encounter for preventive health examination 09/19/2012   Screening for osteoporosis 04/14/2011   Mixed hyperlipidemia 04/14/2011   Screening for cervical cancer 04/13/2011   Screening for breast cancer 04/13/2011   Screening for colon cancer 04/13/2011   Hypertension 03/27/2011    Social History   Tobacco Use   Smoking status: Never   Smokeless tobacco: Never  Substance Use Topics   Alcohol use: No    Allergies  Allergen Reactions   Morphine And Codeine Itching   Sulfa Antibiotics Swelling  Tongue    Current Meds  Medication Sig   albuterol  (VENTOLIN  HFA) 108 (90 Base) MCG/ACT inhaler Inhale 2 puffs into the lungs every 6 (six) hours as needed for wheezing or shortness of breath.   amLODipine  (NORVASC ) 5 MG tablet TAKE 1 TABLET(5 MG) BY MOUTH DAILY   aspirin 81 MG tablet Take 81 mg by  mouth daily.   atorvastatin  (LIPITOR) 20 MG tablet Take 1 tablet (20 mg total) by mouth daily.   BREO ELLIPTA  100-25 MCG/ACT AEPB INHALE 1 PUFF INTO THE LUNGS DAILY   buPROPion  ER (WELLBUTRIN  SR) 100 MG 12 hr tablet TAKE 1 TABLET(100 MG) BY MOUTH TWICE DAILY   Cranberry 500 MG CAPS Take 1 capsule by mouth daily.   cyanocobalamin  (VITAMIN B12) 1000 MCG/ML injection Inject 1,000 mcg into the muscle every 30 (thirty) days.   Docusate Calcium  (STOOL SOFTENER PO) Take 1 capsule by mouth daily.   estradiol (ESTRACE) 1 MG tablet Take 1 mg by mouth daily.   hydrocortisone  cream 0.5 % Apply 1 application. topically 2 (two) times daily as needed for itching.   losartan -hydrochlorothiazide (HYZAAR) 100-25 MG tablet Take 1 tablet by mouth daily.   montelukast  (SINGULAIR ) 10 MG tablet Take 1 tablet (10 mg total) by mouth at bedtime.   nystatin  cream (MYCOSTATIN ) Apply 1 Application topically 2 (two) times daily.   PREVIDENT 5000 DRY MOUTH 1.1 % GEL dental gel    triamcinolone  cream (KENALOG ) 0.1 % Apply 1 Application topically 2 (two) times daily.   VITAMIN D  PO Take 400 mg by mouth daily.   vitamin E 400 UNIT capsule Take 400 Units by mouth daily.    Immunization History  Administered Date(s) Administered   Fluad Quad(high Dose 65+) 03/19/2020, 02/14/2021   Fluad Trivalent(High Dose 65+) 03/08/2023   Influenza Split 03/26/2012   Influenza, High Dose Seasonal PF 02/22/2019, 03/13/2022   Influenza,inj,Quad PF,6+ Mos 02/22/2013, 02/10/2014   Influenza-Unspecified 04/04/2016, 03/13/2017, 01/31/2018   PFIZER Comirnaty(Gray Top)Covid-19 Tri-Sucrose Vaccine 05/25/2019, 06/15/2019, 10/04/2020   PFIZER(Purple Top)SARS-COV-2 Vaccination 05/25/2019, 06/15/2019, 10/05/2019, 02/12/2020   Pfizer Covid-19 Vaccine Bivalent Booster 20yrs & up 01/24/2021   Pneumococcal Conjugate-13 05/15/2016   Pneumococcal Polysaccharide-23 03/14/2020   Rsv, Bivalent, Protein Subunit Rsvpref,pf [place]) 05/27/2022   Td  12/08/2018   Tdap 01/20/2010, 07/24/2010   Zoster, Live 10/19/2013        Objective:     BP 116/70 (BP Location: Left Arm, Patient Position: Sitting, Cuff Size: Normal)   Pulse 73   Temp 98.5 F (36.9 C) (Oral)   Ht 5' 4 (1.626 m)   Wt 157 lb 6.4 oz (71.4 kg)   SpO2 95%   BMI 27.02 kg/m   SpO2: 95 %  GENERAL: Overweight woman, no acute distress, fully ambulatory. No conversational dyspnea. HEAD: Normocephalic, atraumatic.  EYES: Pupils equal, round, reactive to light. No scleral icterus.  MOUTH: Dentition intact, oral mucosa moist. No thrush. NECK: Supple. No thyromegaly. Trachea midline. No JVD. No adenopathy. PULMONARY: Good air entry bilaterally. No adventitious sounds. CARDIOVASCULAR: S1 and S2. Regular rate and rhythm. No rubs, murmurs or gallops heard. ABDOMEN: Benign. MUSCULOSKELETAL: Mild OA changes both hands, no clubbing, no edema.  NEUROLOGIC: No overt focal deficit, no gait disturbance, speech is fluent. SKIN: Intact,warm,dry. PSYCH: Mood and behavior normal.   Lab Results  Component Value Date   NITRICOXIDE 20 11/11/2023  *No evidence of type II inflammation noted       Assessment & Plan:     ICD-10-CM   1. Moderate persistent  asthma without complication  J45.40 Nitric oxide     2. Dyspnea on exertion  R06.09     3. Incidental lung nodules, > 3mm and < 8mm  R91.1       Orders Placed This Encounter  Procedures   Nitric oxide    Discussion:    Moderate persistent asthma Moderate persistent asthma is well-controlled with no asthmatic symptoms. Nitric oxide  level is 20, indicating good control of type II inflammation. - Continue Breo Ellipta  100, 1 puff daily. - Continue as needed albuterol  - Follow up in 6 months   Dyspnea on exertion resolved with Breo Ellipta , was due to asthma.  Incidental lung nodules, deemed benign after last CT chest on 08 August 2021.  No need for further imaging.     Advised if symptoms do not improve or worsen,  to please contact office for sooner follow up or seek emergency care.    I spent 22 minutes of dedicated to the care of this patient on the date of this encounter to include pre-visit review of records, face-to-face time with the patient discussing conditions above, post visit ordering of testing, clinical documentation with the electronic health record, making appropriate referrals as documented, and communicating necessary findings to members of the patients care team.     C. Leita Sanders, MD Advanced Bronchoscopy PCCM [facility]    *This note was generated using voice recognition software/Dragon and/or AI transcription program.  Despite best efforts to proofread, errors can occur which can change the meaning. Any transcriptional errors that result from this process are unintentional and may not be fully corrected at the time of dictation.

## 2023-11-15 ENCOUNTER — Ambulatory Visit: Payer: Medicare PPO | Admitting: Pulmonary Disease

## 2023-11-22 ENCOUNTER — Other Ambulatory Visit: Payer: Self-pay | Admitting: Pulmonary Disease

## 2023-11-23 ENCOUNTER — Ambulatory Visit

## 2023-11-23 ENCOUNTER — Other Ambulatory Visit

## 2023-11-23 DIAGNOSIS — E538 Deficiency of other specified B group vitamins: Secondary | ICD-10-CM | POA: Diagnosis not present

## 2023-11-23 MED ORDER — CYANOCOBALAMIN 1000 MCG/ML IJ SOLN
1000.0000 ug | Freq: Once | INTRAMUSCULAR | Status: AC
  Administered 2023-11-23: 1000 ug via INTRAMUSCULAR

## 2023-11-23 NOTE — Progress Notes (Signed)
 Pt received B12 injection in Left  deltoid muscle. Pt tolerated it well with no complaints or concerns.

## 2023-12-20 ENCOUNTER — Other Ambulatory Visit: Payer: Self-pay

## 2023-12-20 MED ORDER — AMLODIPINE BESYLATE 5 MG PO TABS: ORAL_TABLET | ORAL | 1 refills | Status: DC

## 2023-12-27 ENCOUNTER — Ambulatory Visit (INDEPENDENT_AMBULATORY_CARE_PROVIDER_SITE_OTHER)

## 2023-12-27 DIAGNOSIS — E538 Deficiency of other specified B group vitamins: Secondary | ICD-10-CM | POA: Diagnosis not present

## 2023-12-27 MED ORDER — CYANOCOBALAMIN 1000 MCG/ML IJ SOLN
1000.0000 ug | Freq: Once | INTRAMUSCULAR | Status: AC
  Administered 2023-12-27 (×2): 1000 ug via INTRAMUSCULAR

## 2023-12-27 NOTE — Progress Notes (Signed)
 Pt presented for their vitamin B12 injection. Pt was identified through two identifiers. Pt tolerated shot well in their left deltoid.

## 2024-01-04 DIAGNOSIS — M19049 Primary osteoarthritis, unspecified hand: Secondary | ICD-10-CM | POA: Diagnosis not present

## 2024-01-13 DIAGNOSIS — J340 Abscess, furuncle and carbuncle of nose: Secondary | ICD-10-CM | POA: Diagnosis not present

## 2024-01-31 ENCOUNTER — Ambulatory Visit (INDEPENDENT_AMBULATORY_CARE_PROVIDER_SITE_OTHER)

## 2024-01-31 DIAGNOSIS — E538 Deficiency of other specified B group vitamins: Secondary | ICD-10-CM | POA: Diagnosis not present

## 2024-01-31 MED ORDER — CYANOCOBALAMIN 1000 MCG/ML IJ SOLN
1000.0000 ug | Freq: Once | INTRAMUSCULAR | Status: AC
  Administered 2024-01-31: 1000 ug via INTRAMUSCULAR

## 2024-01-31 NOTE — Progress Notes (Signed)
 Pt presented for their vitamin B12 injection. Pt was identified through two identifiers. Pt tolerated shot well in their right deltoid.

## 2024-03-01 ENCOUNTER — Ambulatory Visit (INDEPENDENT_AMBULATORY_CARE_PROVIDER_SITE_OTHER)

## 2024-03-01 DIAGNOSIS — E538 Deficiency of other specified B group vitamins: Secondary | ICD-10-CM

## 2024-03-01 MED ORDER — CYANOCOBALAMIN 1000 MCG/ML IJ SOLN
1000.0000 ug | Freq: Once | INTRAMUSCULAR | Status: AC
  Administered 2024-03-01: 1000 ug via INTRAMUSCULAR

## 2024-03-01 NOTE — Progress Notes (Addendum)
 Patient was administered a B12 injection into her left deltoid. Patient tolerated the B12 injection well.

## 2024-03-16 ENCOUNTER — Other Ambulatory Visit: Payer: Self-pay | Admitting: Internal Medicine

## 2024-03-16 DIAGNOSIS — E785 Hyperlipidemia, unspecified: Secondary | ICD-10-CM

## 2024-03-22 DIAGNOSIS — H903 Sensorineural hearing loss, bilateral: Secondary | ICD-10-CM | POA: Diagnosis not present

## 2024-04-03 ENCOUNTER — Ambulatory Visit

## 2024-04-03 DIAGNOSIS — Z23 Encounter for immunization: Secondary | ICD-10-CM

## 2024-04-03 DIAGNOSIS — E538 Deficiency of other specified B group vitamins: Secondary | ICD-10-CM

## 2024-04-03 MED ORDER — CYANOCOBALAMIN 1000 MCG/ML IJ SOLN
1000.0000 ug | Freq: Once | INTRAMUSCULAR | Status: AC
  Administered 2024-04-03: 1000 ug via INTRAMUSCULAR

## 2024-04-03 NOTE — Progress Notes (Signed)
 Patient was administered a B12 injection into her right deltoid. Patient tolerated the B12 injection well.  Patient also wanted a high dose flu vaccine so that was administered into her left deltoid. Patient tolerated the high dose flu vaccine well.

## 2024-04-11 DIAGNOSIS — D2272 Melanocytic nevi of left lower limb, including hip: Secondary | ICD-10-CM | POA: Diagnosis not present

## 2024-04-11 DIAGNOSIS — L570 Actinic keratosis: Secondary | ICD-10-CM | POA: Diagnosis not present

## 2024-04-11 DIAGNOSIS — D2261 Melanocytic nevi of right upper limb, including shoulder: Secondary | ICD-10-CM | POA: Diagnosis not present

## 2024-04-11 DIAGNOSIS — D2262 Melanocytic nevi of left upper limb, including shoulder: Secondary | ICD-10-CM | POA: Diagnosis not present

## 2024-04-11 DIAGNOSIS — D2271 Melanocytic nevi of right lower limb, including hip: Secondary | ICD-10-CM | POA: Diagnosis not present

## 2024-04-11 DIAGNOSIS — D2339 Other benign neoplasm of skin of other parts of face: Secondary | ICD-10-CM | POA: Diagnosis not present

## 2024-04-11 DIAGNOSIS — R208 Other disturbances of skin sensation: Secondary | ICD-10-CM | POA: Diagnosis not present

## 2024-04-11 DIAGNOSIS — L820 Inflamed seborrheic keratosis: Secondary | ICD-10-CM | POA: Diagnosis not present

## 2024-04-11 DIAGNOSIS — Z808 Family history of malignant neoplasm of other organs or systems: Secondary | ICD-10-CM | POA: Diagnosis not present

## 2024-04-11 DIAGNOSIS — L821 Other seborrheic keratosis: Secondary | ICD-10-CM | POA: Diagnosis not present

## 2024-04-11 DIAGNOSIS — D225 Melanocytic nevi of trunk: Secondary | ICD-10-CM | POA: Diagnosis not present

## 2024-04-17 ENCOUNTER — Ambulatory Visit: Admitting: Internal Medicine

## 2024-04-17 DIAGNOSIS — H26493 Other secondary cataract, bilateral: Secondary | ICD-10-CM | POA: Diagnosis not present

## 2024-04-17 DIAGNOSIS — H43813 Vitreous degeneration, bilateral: Secondary | ICD-10-CM | POA: Diagnosis not present

## 2024-04-17 DIAGNOSIS — M3501 Sicca syndrome with keratoconjunctivitis: Secondary | ICD-10-CM | POA: Diagnosis not present

## 2024-04-18 ENCOUNTER — Other Ambulatory Visit: Payer: Self-pay | Admitting: Internal Medicine

## 2024-04-21 ENCOUNTER — Encounter: Payer: Self-pay | Admitting: Pulmonary Disease

## 2024-04-21 ENCOUNTER — Ambulatory Visit: Admitting: Pulmonary Disease

## 2024-04-21 VITALS — BP 116/80 | HR 76 | Temp 97.7°F | Ht 64.0 in | Wt 162.0 lb

## 2024-04-21 DIAGNOSIS — J4540 Moderate persistent asthma, uncomplicated: Secondary | ICD-10-CM

## 2024-04-21 DIAGNOSIS — R911 Solitary pulmonary nodule: Secondary | ICD-10-CM

## 2024-04-21 NOTE — Patient Instructions (Addendum)
 VISIT SUMMARY:  Today, you came in for a follow-up visit to check on your asthma and other health concerns. Your asthma is well controlled with your current medication, and you have no new symptoms. We also reviewed your history of lung nodules, which remain stable.  YOUR PLAN:  -MODERATE PERSISTENT ASTHMA, UNCOMPLICATED: Your asthma is well controlled with your current medication, Breo, and you are not experiencing any shortness of breath or cough. Continue taking Breo as prescribed. We will have a follow-up appointment in six months to monitor your condition.  -BENIGN STABLE PULMONARY NODULES: Your lung nodules are benign and have not changed in size. We will continue to monitor them expectantly to ensure they remain stable.  INSTRUCTIONS:  Please continue taking Breo as prescribed for your asthma. We will see you again in six months for a follow-up. Additionally, we will continue to monitor your lung nodules annually.

## 2024-04-21 NOTE — Progress Notes (Signed)
 Subjective:    Patient ID: Shelley Zuniga, female    DOB: [birth year], 73 y.o.   MRN: [id number]  Patient Care Team: Marylynn Verneita CROME, MD as PCP - General (Internal Medicine) Tamea Dedra CROME, MD as Consulting Physician (Pulmonary Disease)  Chief Complaint  Patient presents with   Asthma    No cough, shortness of breath or wheezing.     BACKGROUND/INTERVAL:Shelley Zuniga is a 73 year old lifelong never smoker who presents here for the follow-up on the issue of dyspnea in the setting of moderate persistent asthma.  She was last seen on 11 November 2023.    HPI Discussed the use of AI scribe software for clinical note transcription with the patient, who gave verbal consent to proceed.  History of Present Illness   Shelley Zuniga is a 73 year old female with asthma who presents for a follow-up visit.  Her asthma is well controlled with the use of Breo, and she experiences no shortness of breath or cough. She continues to use Breo without any issues.  She occasionally experiences reflux or heartburn, particularly when consuming certain foods, but does not consider it a frequent issue.  She has a history of lung nodules that have been stable for a long time. These nodules have been monitored through several scans and are similar in size to a mechanical pencil eraser.  The nodules are considered benign.  She does not smoke.  No new issues, shortness of breath, or cough.     DATA 07/15/2021 PFTs: FEV1 1.68 L or 75% predicted, FVC 2.96 L or 106% predicted, FEV1/FVC 83%, significant bronchodilator response with a net change of 21% and FEV1 diffusion capacity normal, lung volumes normal, consistent with moderate obstructive airways disease with significant bronchodilator response (asthmatic type). 08/08/2021 CT chest without contrast: Stable subcentimeter pulmonary nodules largest with mean diameter of 5 mm in the left lower lobe.  Given stability and lack of documented risk factors no specific imaging  follow-up is required.  Small hiatal hernia noted. 06/04/2022 PFTs: FEV1 1.62 L or 105% predicted, FVC 2.23 L or 107% predicted, FEV1/FVC 66%, postbronchodilator her lung volumes normalized.  Normal diffusion capacity.  Consistent with mild obstructive defect asthmatic type.  Review of Systems A 10 point review of systems was performed and it is as noted above otherwise negative.   Patient Active Problem List   Diagnosis Date Noted   Change in stool caliber 10/07/2023   Weak urinary stream 10/07/2023   Skin irritation 10/07/2023   Chronic cough 04/18/2022   Exertional dyspnea 06/27/2021   RUQ abdominal pain 10/22/2020   Pulmonary nodules 10/22/2020   Abdominal aortic atherosclerosis 10/21/2020   Muscle spasm 09/16/2020   Personal history of COVID-19 06/11/2020   Osteoarthritis of thumb, left 12/18/2019   Polyneuropathy in other diseases classified elsewhere 12/18/2019   Insomnia 01/07/2019   Sciatica of left side associated with disorder of lumbar spine 11/21/2016   Impaired fasting glucose 05/15/2016   Major depressive disorder, recurrent episode with melancholic features 11/15/2015   Shingles outbreak 10/01/2014   B12 deficiency 10/20/2013   Vitamin D  deficiency 10/20/2013   Encounter for preventive health examination 09/19/2012   Screening for osteoporosis 04/14/2011   Mixed hyperlipidemia 04/14/2011   Screening for cervical cancer 04/13/2011   Screening for breast cancer 04/13/2011   Screening for colon cancer 04/13/2011   Hypertension 03/27/2011    Social History   Tobacco Use   Smoking status: Never   Smokeless tobacco: Never  Substance Use Topics  Alcohol use: No    Allergies  Allergen Reactions   Morphine And Codeine Itching   Sulfa Antibiotics Swelling    Tongue    Current Meds  Medication Sig   albuterol  (VENTOLIN  HFA) 108 (90 Base) MCG/ACT inhaler Inhale 2 puffs into the lungs every 6 (six) hours as needed for wheezing or shortness of breath.    amLODipine  (NORVASC ) 5 MG tablet TAKE 1 TABLET(5 MG) BY MOUTH DAILY   aspirin 81 MG tablet Take 81 mg by mouth daily.   atorvastatin  (LIPITOR) 20 MG tablet TAKE 1 TABLET(20 MG) BY MOUTH DAILY   buPROPion  ER (WELLBUTRIN  SR) 100 MG 12 hr tablet TAKE 1 TABLET(100 MG) BY MOUTH TWICE DAILY   Cranberry 500 MG CAPS Take 1 capsule by mouth daily.   cyanocobalamin  (VITAMIN B12) 1000 MCG/ML injection Inject 1,000 mcg into the muscle every 30 (thirty) days.   Docusate Calcium  (STOOL SOFTENER PO) Take 1 capsule by mouth daily.   estradiol (ESTRACE) 1 MG tablet Take 1 mg by mouth daily.   fluticasone  furoate-vilanterol (BREO ELLIPTA ) 100-25 MCG/ACT AEPB INHALE 1 PUFF INTO THE LUNGS DAILY   hydrocortisone  cream 0.5 % Apply 1 application. topically 2 (two) times daily as needed for itching.   losartan -hydrochlorothiazide (HYZAAR) 100-25 MG tablet TAKE 1 TABLET BY MOUTH DAILY   montelukast  (SINGULAIR ) 10 MG tablet Take 1 tablet (10 mg total) by mouth at bedtime.   nystatin  cream (MYCOSTATIN ) Apply 1 Application topically 2 (two) times daily.   PREVIDENT 5000 DRY MOUTH 1.1 % GEL dental gel    triamcinolone  cream (KENALOG ) 0.1 % Apply 1 Application topically 2 (two) times daily.   VITAMIN D  PO Take 400 mg by mouth daily.   vitamin E 400 UNIT capsule Take 400 Units by mouth daily.    Immunization History  Administered Date(s) Administered    sv, Bivalent, Protein Subunit Rsvpref,pf ([place]) 05/27/2022   Fluad Quad(high Dose 65+) 03/19/2020, 02/14/2021   Fluad Trivalent(High Dose 65+) 03/08/2023   INFLUENZA, HIGH DOSE SEASONAL PF 02/22/2019, 03/13/2022, 04/03/2024   Influenza Split 03/26/2012   Influenza,inj,Quad PF,6+ Mos 02/22/2013, 02/10/2014   Influenza-Unspecified 04/04/2016, 03/13/2017, 01/31/2018   PFIZER Comirnaty(Gray Top)Covid-19 Tri-Sucrose Vaccine 05/25/2019, 06/15/2019, 10/04/2020   PFIZER(Purple Top)SARS-COV-2 Vaccination 05/25/2019, 06/15/2019, 10/05/2019, 02/12/2020   Pfizer Covid-19  Vaccine Bivalent Booster 42yrs & up 01/24/2021   Pneumococcal Conjugate-13 05/15/2016   Pneumococcal Polysaccharide-23 03/14/2020   Td 12/08/2018   Tdap 01/20/2010, 07/24/2010   Zoster, Live 10/19/2013        Objective:     Vitals:   04/21/24 1034  BP: 116/80  Pulse: 76  Temp: 97.7 F (36.5 C)  Height: 5' 4 (1.626 m)  Weight: 162 lb (73.5 kg)  PF: 96 L/min  TempSrc: Temporal  BMI (Calculated): 27.79    GENERAL: Overweight woman, no acute distress, fully ambulatory. No conversational dyspnea. HEAD: Normocephalic, atraumatic.  EYES: Pupils equal, round, reactive to light. No scleral icterus.  MOUTH: Dentition intact, oral mucosa moist. No thrush. NECK: Supple. No thyromegaly. Trachea midline. No JVD. No adenopathy. PULMONARY: Good air entry bilaterally. No adventitious sounds. CARDIOVASCULAR: S1 and S2. Regular rate and rhythm. No rubs, murmurs or gallops heard. ABDOMEN: Benign. MUSCULOSKELETAL: Mild OA changes both hands, no clubbing, no edema.  NEUROLOGIC: No overt focal deficit, no gait disturbance, speech is fluent. SKIN: Intact,warm,dry. PSYCH: Mood and behavior normal.     Assessment & Plan:     ICD-10-CM   1. Moderate persistent asthma without complication  J45.40  2. Incidental lung nodules, > 3mm and < 8mm - BENIGN  R91.1      Discussion:    Moderate persistent asthma, uncomplicated Asthma is well controlled with no issues of dyspnea or cough. Continues on Breo with no adverse effects. - Continue Breo as prescribed - Scheduled follow-up in six months  Benign stable pulmonary nodules Pulmonary nodules are benign and stable, with no increase in size or risk factors. Previous scans confirm stability. - No further imaging necessary    Follow-up in 6 months time.  Advised if symptoms do not improve or worsen, to please contact office for sooner follow up or seek emergency care.    I spent 30 minutes of dedicated to the care of this patient on the  date of this encounter to include pre-visit review of records, face-to-face time with the patient discussing conditions above, post visit ordering of testing, clinical documentation with the electronic health record, making appropriate referrals as documented, and communicating necessary findings to members of the patients care team.     C. Leita Sanders, MD Advanced Bronchoscopy PCCM [facility]    *This note was generated using voice recognition software/Dragon and/or AI transcription program.  Despite best efforts to proofread, errors can occur which can change the meaning. Any transcriptional errors that result from this process are unintentional and may not be fully corrected at the time of dictation.

## 2024-04-24 DIAGNOSIS — L570 Actinic keratosis: Secondary | ICD-10-CM | POA: Diagnosis not present

## 2024-05-03 ENCOUNTER — Ambulatory Visit

## 2024-05-03 DIAGNOSIS — E538 Deficiency of other specified B group vitamins: Secondary | ICD-10-CM

## 2024-05-03 MED ORDER — CYANOCOBALAMIN 1000 MCG/ML IJ SOLN
1000.0000 ug | Freq: Once | INTRAMUSCULAR | Status: AC
  Administered 2024-05-03: 10:00:00 1000 ug via INTRAMUSCULAR

## 2024-05-03 NOTE — Progress Notes (Cosign Needed)
 Patient was administered a B12 injection into her left deltoid. Patient tolerated the B12 injection well.

## 2024-05-23 ENCOUNTER — Ambulatory Visit: Admitting: Internal Medicine

## 2024-05-23 ENCOUNTER — Encounter: Payer: Self-pay | Admitting: Internal Medicine

## 2024-05-23 VITALS — BP 122/62 | HR 70 | Temp 98.1°F | Ht 64.0 in | Wt 161.4 lb

## 2024-05-23 DIAGNOSIS — M1811 Unilateral primary osteoarthritis of first carpometacarpal joint, right hand: Secondary | ICD-10-CM

## 2024-05-23 DIAGNOSIS — R0602 Shortness of breath: Secondary | ICD-10-CM

## 2024-05-23 DIAGNOSIS — R0609 Other forms of dyspnea: Secondary | ICD-10-CM | POA: Diagnosis not present

## 2024-05-23 DIAGNOSIS — R195 Other fecal abnormalities: Secondary | ICD-10-CM

## 2024-05-23 DIAGNOSIS — I700 Atherosclerosis of aorta: Secondary | ICD-10-CM

## 2024-05-23 DIAGNOSIS — F339 Major depressive disorder, recurrent, unspecified: Secondary | ICD-10-CM

## 2024-05-23 DIAGNOSIS — I10 Essential (primary) hypertension: Secondary | ICD-10-CM | POA: Diagnosis not present

## 2024-05-23 DIAGNOSIS — E538 Deficiency of other specified B group vitamins: Secondary | ICD-10-CM

## 2024-05-23 DIAGNOSIS — Z1231 Encounter for screening mammogram for malignant neoplasm of breast: Secondary | ICD-10-CM

## 2024-05-23 DIAGNOSIS — Z13820 Encounter for screening for osteoporosis: Secondary | ICD-10-CM | POA: Diagnosis not present

## 2024-05-23 DIAGNOSIS — E782 Mixed hyperlipidemia: Secondary | ICD-10-CM

## 2024-05-23 DIAGNOSIS — Z1211 Encounter for screening for malignant neoplasm of colon: Secondary | ICD-10-CM

## 2024-05-23 DIAGNOSIS — E559 Vitamin D deficiency, unspecified: Secondary | ICD-10-CM

## 2024-05-23 MED ORDER — AMLODIPINE BESYLATE 5 MG PO TABS: ORAL_TABLET | ORAL | 1 refills | Status: AC

## 2024-05-23 MED ORDER — MONTELUKAST SODIUM 10 MG PO TABS: 10.0000 mg | ORAL_TABLET | Freq: Every day | ORAL | 1 refills | Status: AC

## 2024-05-23 NOTE — Patient Instructions (Signed)
" °  I APOLOGIZE FOR THE DELAY IN THE GI REFERRAL TO [facility]  I HAVE MADE A NEW REFERRAL TO [facility] FOR YOUR COLON CA SCREENING  I HAVE ORDERED THE CORONARY CT SCAN   "

## 2024-05-23 NOTE — Assessment & Plan Note (Signed)
 PATIENT WAS REFERRED TO [facility] IN MAY FOR COLONOSCOPY DUE TO CHANGE IN STOOL CALIBER AND HAS NOT BEEN CONTACTED

## 2024-05-23 NOTE — Progress Notes (Unsigned)
 "  Subjective:  Patient ID: Shelley Zuniga, female    DOB: 12/03/1950  Age: 74 y.o. MRN: [id number]  CC: The primary encounter diagnosis was Colon cancer screening. Diagnoses of Encounter for screening mammogram for malignant neoplasm of breast, Screening for colon cancer, Change in stool caliber, Shortness of breath, Vitamin D  deficiency, B12 deficiency, Mixed hyperlipidemia, Primary hypertension, Screening for osteoporosis, Osteoarthritis of thumb, right, Major depressive disorder, recurrent episode with melancholic features, Exertional dyspnea, and Abdominal aortic atherosclerosis were also pertinent to this visit.   HPI Shelley Zuniga presents for  Chief Complaint  Patient presents with   Medical Management of Chronic Issues   1) CHANGE IN STOOL CALIBER: GI REFERRAL WAS MADE IN MAY TO [place] AND AUTHORIZED,  BUT PATIENT WAS NEVER CONTACTED. She continues to have need for colonoscopy due to smaller caliber stools.   2) MAMMOGRAM DONE IN FEB,  ORDERED BY HER GYN   3) Hypertension: patient checks blood pressure twice weekly at home.  Readings have been for the most part <130/80 at rest . Patient is following a reduced salt diet most days and is taking amlodipine  5 mg daily  and losartan /hct  as prescribed   4) Hyperlipidemia:  she is tolerating statin  5) Dyspnea: improved with regular use of Breo; however she is requesting noninvasive risk stratification for CAD with coronary calcium  CT scan   Outpatient Medications Prior to Visit  Medication Sig Dispense Refill   albuterol  (VENTOLIN  HFA) 108 (90 Base) MCG/ACT inhaler Inhale 2 puffs into the lungs every 6 (six) hours as needed for wheezing or shortness of breath. 17 each 2   aspirin 81 MG tablet Take 81 mg by mouth daily.     atorvastatin  (LIPITOR) 20 MG tablet TAKE 1 TABLET(20 MG) BY MOUTH DAILY 90 tablet 3   buPROPion  ER (WELLBUTRIN  SR) 100 MG 12 hr tablet TAKE 1 TABLET(100 MG) BY MOUTH TWICE DAILY 180 tablet 1   Cranberry 500 MG CAPS  Take 1 capsule by mouth daily.     cyanocobalamin  (VITAMIN B12) 1000 MCG/ML injection Inject 1,000 mcg into the muscle every 30 (thirty) days.     Docusate Calcium  (STOOL SOFTENER PO) Take 1 capsule by mouth daily.     estradiol (ESTRACE) 1 MG tablet Take 1 mg by mouth daily.     fluticasone  furoate-vilanterol (BREO ELLIPTA ) 100-25 MCG/ACT AEPB INHALE 1 PUFF INTO THE LUNGS DAILY 60 each 11   hydrocortisone  cream 0.5 % Apply 1 application. topically 2 (two) times daily as needed for itching. 30 g 5   losartan -hydrochlorothiazide (HYZAAR) 100-25 MG tablet TAKE 1 TABLET BY MOUTH DAILY 90 tablet 1   nystatin  cream (MYCOSTATIN ) Apply 1 Application topically 2 (two) times daily. 30 g 0   PREVIDENT 5000 DRY MOUTH 1.1 % GEL dental gel      triamcinolone  cream (KENALOG ) 0.1 % Apply 1 Application topically 2 (two) times daily. 30 g 0   VITAMIN D  PO Take 400 mg by mouth daily.     vitamin E 400 UNIT capsule Take 400 Units by mouth daily.     amLODipine  (NORVASC ) 5 MG tablet TAKE 1 TABLET(5 MG) BY MOUTH DAILY 90 tablet 1   montelukast  (SINGULAIR ) 10 MG tablet Take 1 tablet (10 mg total) by mouth at bedtime. 90 tablet 1   No facility-administered medications prior to visit.    Review of Systems;  Patient denies headache, fevers, malaise, unintentional weight loss, skin rash, eye pain, sinus congestion and sinus pain, sore throat,  dysphagia,  hemoptysis , cough, dyspnea, wheezing, chest pain, palpitations, orthopnea, edema, abdominal pain, nausea, melena, diarrhea, constipation, flank pain, dysuria, hematuria, urinary  Frequency, nocturia, numbness, tingling, seizures,  Focal weakness, Loss of consciousness,  Tremor, insomnia, depression, anxiety, and suicidal ideation.      Objective:  BP 122/62   Pulse 70   Temp 98.1 F (36.7 C) (Oral)   Ht 5' 4 (1.626 m)   Wt 161 lb 6.4 oz (73.2 kg)   SpO2 97%   BMI 27.70 kg/m   BP Readings from Last 3 Encounters:  05/23/24 122/62  04/21/24 116/80   11/11/23 116/70    Wt Readings from Last 3 Encounters:  05/23/24 161 lb 6.4 oz (73.2 kg)  04/21/24 162 lb (73.5 kg)  11/11/23 157 lb 6.4 oz (71.4 kg)    Physical Exam Vitals reviewed.  Constitutional:      General: She is not in acute distress.    Appearance: Normal appearance. She is normal weight. She is not ill-appearing, toxic-appearing or diaphoretic.  HENT:     Head: Normocephalic.  Eyes:     General: No scleral icterus.       Right eye: No discharge.        Left eye: No discharge.     Conjunctiva/sclera: Conjunctivae normal.  Cardiovascular:     Rate and Rhythm: Normal rate and regular rhythm.     Heart sounds: Normal heart sounds.  Pulmonary:     Effort: Pulmonary effort is normal. No respiratory distress.     Breath sounds: Normal breath sounds.  Musculoskeletal:        General: Normal range of motion.  Skin:    General: Skin is warm and dry.  Neurological:     General: No focal deficit present.     Mental Status: She is alert and oriented to person, place, and time. Mental status is at baseline.  Psychiatric:        Mood and Affect: Mood normal.        Behavior: Behavior normal.        Thought Content: Thought content normal.        Judgment: Judgment normal.     Lab Results  Component Value Date   HGBA1C 6.0 10/07/2023   HGBA1C 6.3 10/19/2022   HGBA1C 6.0 07/04/2021    Lab Results  Component Value Date   CREATININE 0.80 10/07/2023   CREATININE 0.84 10/19/2022   CREATININE 0.79 04/16/2022    Lab Results  Component Value Date   WBC 6.4 10/07/2023   HGB 14.2 10/07/2023   HCT 41.3 10/07/2023   PLT 266.0 10/07/2023   GLUCOSE 100 (H) 10/07/2023   CHOL 167 05/23/2024   TRIG 110 05/23/2024   HDL 77 05/23/2024   LDLDIRECT 80.0 10/07/2023   LDLCALC 70 05/23/2024   ALT 14 10/07/2023   AST 11 10/07/2023   NA 138 10/07/2023   K 3.6 10/07/2023   CL 100 10/07/2023   CREATININE 0.80 10/07/2023   BUN 16 10/07/2023   CO2 32 10/07/2023   TSH 1.25  10/07/2023   HGBA1C 6.0 10/07/2023   MICROALBUR <0.7 10/07/2023    CT ABDOMEN PELVIS W WO CONTRAST Result Date: 10/16/2023 CLINICAL DATA:  Lower abdominal pain for 2 months.  Cramping. EXAM: CT ABDOMEN AND PELVIS WITHOUT AND WITH CONTRAST TECHNIQUE: Multidetector CT imaging of the abdomen and pelvis was performed following the standard protocol before and following the bolus administration of intravenous contrast. RADIATION DOSE REDUCTION: This exam was performed according  to the departmental dose-optimization program which includes automated exposure control, adjustment of the mA and/or kV according to patient size and/or use of iterative reconstruction technique. CONTRAST:  OMNIPAQUE  IOHEXOL  300 MG/ML  SOLN COMPARISON:  Noncontrast CT on 10/10/2020 FINDINGS: Lower Chest: No acute findings. Stable 5 mm pulmonary nodule in lateral left lower lobe, consistent with benign etiology. Hepatobiliary: No suspicious hepatic masses identified. Prior cholecystectomy again noted. Stable mild chronic biliary ductal dilatation, likely due to post cholecystectomy effect. Pancreas:  No mass or inflammatory changes. Spleen: Within normal limits in size and appearance. Adrenals/Urinary Tract: No suspicious masses identified. Benign-appearing right renal cyst again seen (No followup imaging is recommended). No evidence of ureteral calculi or hydronephrosis. Unremarkable unopacified urinary bladder. Stomach/Bowel: Stable tiny hiatal hernia. No evidence of obstruction, inflammatory process or abnormal fluid collections. Normal appendix visualized. Vascular/Lymphatic: No pathologically enlarged lymph nodes. No acute vascular findings. Reproductive: Prior hysterectomy noted. Adnexal regions are unremarkable in appearance. Other:  None. Musculoskeletal:  No suspicious bone lesions identified. IMPRESSION: No acute findings within the abdomen or pelvis. Stable tiny hiatal hernia. Electronically Signed   By: Norleen DELENA Kil M.D.    On: 10/16/2023 13:34    Assessment & Plan:  .Colon cancer screening  Encounter for screening mammogram for malignant neoplasm of breast  Screening for colon cancer Assessment & Plan: PATIENT WAS REFERRED TO [facility] IN MAY FOR COLONOSCOPY DUE TO CHANGE IN STOOL CALIBER AND HAS NOT BEEN CONTACTED   Orders: -     Ambulatory referral to Gastroenterology  Change in stool caliber Assessment & Plan: The referral to [facility] in May was authorized but patient was never called.  Referral made today to [facility]  Orders: -     Ambulatory referral to Gastroenterology  Shortness of breath -     CT CORONARY MORPH W/CTA COR W/SCORE W/CA W/CM &/OR WO/CM; Future  Vitamin D  deficiency -     VITAMIN D  25 Hydroxy (Vit-D Deficiency, Fractures)  B12 deficiency Assessment & Plan: She prefers to continue monthly parenteral injections at the office  Lab Results  Component Value Date   VITAMINB12 >1500 (H) 04/16/2022     Orders: -     B12 and Folate Panel  Mixed hyperlipidemia -     Lipid Panel w/reflex Direct LDL  Primary hypertension -     Comprehensive metabolic panel with GFR  Screening for osteoporosis Assessment & Plan: Repeat DEXA in 2018 was also normal.     Osteoarthritis of thumb, right Assessment & Plan: Managed with supportive brace.   Major depressive disorder, recurrent episode with melancholic features Assessment & Plan: Has been managed for over 5 years with wellbutrin .  Mood is normal despite multiple family adverse events.  No changes today    Exertional dyspnea Assessment & Plan: Symptoms have improved with treatment of asthma ,  but  given her concurrent histor yof hypertension and hyperlipidemia, Screening for occult CAD with coronary CT requested and ordered    Abdominal aortic atherosclerosis Assessment & Plan: She is tolerating atorvastatin  20 mg daily    Other orders -     amLODIPine  Besylate; TAKE 1 TABLET(5 MG) BY MOUTH DAILY  Dispense:  90 tablet; Refill: 1 -     Montelukast  Sodium; Take 1 tablet (10 mg total) by mouth at bedtime.  Dispense: 90 tablet; Refill: 1    Follow-up: Return in about 6 months (around 11/20/2024).   Verneita LITTIE Kettering, MD "

## 2024-05-24 ENCOUNTER — Ambulatory Visit: Payer: Self-pay | Admitting: Internal Medicine

## 2024-05-24 LAB — LIPID PANEL W/REFLEX DIRECT LDL
Cholesterol: 167 mg/dL
HDL: 77 mg/dL
LDL Cholesterol (Calc): 70 mg/dL
Non-HDL Cholesterol (Calc): 90 mg/dL
Total CHOL/HDL Ratio: 2.2 (calc)
Triglycerides: 110 mg/dL

## 2024-05-24 LAB — COMPREHENSIVE METABOLIC PANEL WITH GFR
ALT: 14 U/L (ref 3–35)
AST: 13 U/L (ref 5–37)
Albumin: 4.2 g/dL (ref 3.5–5.2)
Alkaline Phosphatase: 57 U/L (ref 39–117)
BUN: 19 mg/dL (ref 6–23)
CO2: 31 meq/L (ref 19–32)
Calcium: 9.8 mg/dL (ref 8.4–10.5)
Chloride: 99 meq/L (ref 96–112)
Creatinine, Ser: 0.83 mg/dL (ref 0.40–1.20)
GFR: 69.5 mL/min
Glucose, Bld: 95 mg/dL (ref 70–99)
Potassium: 3.7 meq/L (ref 3.5–5.1)
Sodium: 138 meq/L (ref 135–145)
Total Bilirubin: 0.5 mg/dL (ref 0.2–1.2)
Total Protein: 6.9 g/dL (ref 6.0–8.3)

## 2024-05-24 LAB — B12 AND FOLATE PANEL
Folate: 17.1 ng/mL
Vitamin B-12: 355 pg/mL (ref 211–911)

## 2024-05-24 LAB — VITAMIN D 25 HYDROXY (VIT D DEFICIENCY, FRACTURES): VITD: 28.82 ng/mL — ABNORMAL LOW (ref 30.00–100.00)

## 2024-05-24 MED ORDER — ERGOCALCIFEROL 1.25 MG (50000 UT) PO CAPS: 50000.0000 [IU] | ORAL_CAPSULE | ORAL | 0 refills | Status: AC

## 2024-05-24 NOTE — Assessment & Plan Note (Signed)
 She is tolerating atorvastatin  20 mg daily

## 2024-05-24 NOTE — Assessment & Plan Note (Signed)
 Repeat DEXA in 2018 was also normal.

## 2024-05-24 NOTE — Assessment & Plan Note (Addendum)
 Symptoms have improved with treatment of asthma ,  but  given her concurrent histor yof hypertension and hyperlipidemia, Screening for occult CAD with coronary CT requested and ordered

## 2024-05-24 NOTE — Assessment & Plan Note (Signed)
 Managed with supportive brace.

## 2024-05-24 NOTE — Assessment & Plan Note (Addendum)
 She prefers to continue monthly parenteral injections at the office  Lab Results  Component Value Date   VITAMINB12 >1500 (H) 04/16/2022

## 2024-05-24 NOTE — Assessment & Plan Note (Signed)
 Has been managed for over 5 years with wellbutrin .  Mood is normal despite multiple family adverse events.  No changes today

## 2024-05-24 NOTE — Assessment & Plan Note (Signed)
 The referral to [facility] in May was authorized but patient was never called.  Referral made today to [facility]

## 2024-05-30 ENCOUNTER — Other Ambulatory Visit: Payer: Self-pay

## 2024-05-30 NOTE — Telephone Encounter (Signed)
 error

## 2024-06-07 ENCOUNTER — Ambulatory Visit

## 2024-06-07 DIAGNOSIS — E538 Deficiency of other specified B group vitamins: Secondary | ICD-10-CM

## 2024-06-07 MED ORDER — CYANOCOBALAMIN 1000 MCG/ML IJ SOLN
1000.0000 ug | Freq: Once | INTRAMUSCULAR | Status: AC
  Administered 2024-06-07: 1000 ug via INTRAMUSCULAR

## 2024-06-07 NOTE — Progress Notes (Signed)
 Patient was administered a B12 injection into her right deltoid. Patient tolerated the B12 injection well.

## 2024-06-15 ENCOUNTER — Ambulatory Visit
Admission: RE | Admit: 2024-06-15 | Discharge: 2024-06-15 | Disposition: A | Discharge status: Home / Self Care | Payer: Self-pay | Source: Ambulatory Visit | Attending: Internal Medicine | Admitting: Internal Medicine

## 2024-06-15 DIAGNOSIS — R0602 Shortness of breath: Secondary | ICD-10-CM | POA: Insufficient documentation

## 2024-06-17 ENCOUNTER — Ambulatory Visit: Payer: Self-pay | Admitting: Internal Medicine

## 2024-07-06 ENCOUNTER — Ambulatory Visit: Admitting: Gastroenterology

## 2024-07-12 ENCOUNTER — Ambulatory Visit

## 2024-08-09 ENCOUNTER — Ambulatory Visit

## 2024-11-22 ENCOUNTER — Ambulatory Visit: Admitting: Internal Medicine
